# Patient Record
Sex: Female | Born: 1975 | Race: White | Hispanic: No | Marital: Single | Smoking: Never smoker
Health system: Southern US, Community
[De-identification: ages and names within clinical notes are randomized; demographics above are authoritative.]

## PROBLEM LIST (undated history)

## (undated) DIAGNOSIS — E039 Hypothyroidism, unspecified: Secondary | ICD-10-CM

## (undated) DIAGNOSIS — E079 Disorder of thyroid, unspecified: Secondary | ICD-10-CM

## (undated) DIAGNOSIS — G4730 Sleep apnea, unspecified: Secondary | ICD-10-CM

## (undated) DIAGNOSIS — R569 Unspecified convulsions: Secondary | ICD-10-CM

## (undated) DIAGNOSIS — I10 Essential (primary) hypertension: Secondary | ICD-10-CM

## (undated) DIAGNOSIS — Z9989 Dependence on other enabling machines and devices: Secondary | ICD-10-CM

## (undated) DIAGNOSIS — Q059 Spina bifida, unspecified: Secondary | ICD-10-CM

## (undated) HISTORY — DX: Hereditary hemochromatosis: E83.110

## (undated) HISTORY — DX: Essential (primary) hypertension: I10

## (undated) HISTORY — DX: Spina bifida, unspecified: Q05.9

## (undated) HISTORY — DX: Disorder of thyroid, unspecified: E07.9

## (undated) HISTORY — DX: Unspecified convulsions: R56.9

## (undated) HISTORY — DX: Sleep apnea, unspecified: G47.30

## (undated) HISTORY — DX: Dependence on other enabling machines and devices: Z99.89

---
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
## 1998-05-09 HISTORY — PX: OTHER SURGICAL HISTORY: SHX169

## 2009-11-06 ENCOUNTER — Ambulatory Visit: Payer: Self-pay | Admitting: Internal Medicine

## 2009-11-13 ENCOUNTER — Ambulatory Visit: Payer: Self-pay | Admitting: Internal Medicine

## 2009-12-07 ENCOUNTER — Ambulatory Visit: Payer: Self-pay | Admitting: Internal Medicine

## 2010-01-07 ENCOUNTER — Ambulatory Visit: Payer: Self-pay | Admitting: Internal Medicine

## 2010-02-06 ENCOUNTER — Ambulatory Visit: Payer: Self-pay | Admitting: Internal Medicine

## 2010-03-09 ENCOUNTER — Ambulatory Visit: Payer: Self-pay | Admitting: Internal Medicine

## 2010-04-08 ENCOUNTER — Ambulatory Visit: Payer: Self-pay | Admitting: Internal Medicine

## 2010-05-09 ENCOUNTER — Ambulatory Visit: Payer: Self-pay | Admitting: Internal Medicine

## 2010-06-09 ENCOUNTER — Ambulatory Visit: Payer: Self-pay | Admitting: Internal Medicine

## 2010-07-08 ENCOUNTER — Ambulatory Visit: Payer: Self-pay | Admitting: Internal Medicine

## 2010-07-26 ENCOUNTER — Ambulatory Visit: Payer: Self-pay | Admitting: Cardiothoracic Surgery

## 2010-07-28 ENCOUNTER — Ambulatory Visit: Payer: Self-pay | Admitting: Cardiothoracic Surgery

## 2010-08-08 ENCOUNTER — Ambulatory Visit: Payer: Self-pay | Admitting: Internal Medicine

## 2010-09-07 ENCOUNTER — Ambulatory Visit: Payer: Self-pay | Admitting: Internal Medicine

## 2010-10-08 ENCOUNTER — Ambulatory Visit: Payer: Self-pay | Admitting: Internal Medicine

## 2010-11-07 ENCOUNTER — Ambulatory Visit: Payer: Self-pay | Admitting: Internal Medicine

## 2010-11-20 LAB — AFP TUMOR MARKER: AFP-Tumor Marker: 2.9 ng/mL (ref 0.0–8.3)

## 2010-12-08 ENCOUNTER — Ambulatory Visit: Payer: Self-pay | Admitting: Internal Medicine

## 2011-01-08 ENCOUNTER — Ambulatory Visit: Payer: Self-pay | Admitting: Internal Medicine

## 2011-02-07 ENCOUNTER — Ambulatory Visit: Payer: Self-pay | Admitting: Internal Medicine

## 2011-03-10 ENCOUNTER — Ambulatory Visit: Payer: Self-pay | Admitting: Internal Medicine

## 2011-04-09 ENCOUNTER — Ambulatory Visit: Payer: Self-pay | Admitting: Internal Medicine

## 2011-05-10 ENCOUNTER — Ambulatory Visit: Payer: Self-pay | Admitting: Internal Medicine

## 2011-06-03 LAB — CANCER CENTER HEMOGLOBIN: HGB: 11.4 g/dL — ABNORMAL LOW (ref 12.0–16.0)

## 2011-06-10 ENCOUNTER — Ambulatory Visit: Payer: Self-pay | Admitting: Internal Medicine

## 2011-06-17 LAB — HEPATIC FUNCTION PANEL A (ARMC)
Alkaline Phosphatase: 85 U/L (ref 50–136)
Bilirubin,Total: 0.2 mg/dL (ref 0.2–1.0)
SGPT (ALT): 38 U/L

## 2011-06-17 LAB — FERRITIN: Ferritin (ARMC): 1057 ng/mL — ABNORMAL HIGH (ref 8–388)

## 2011-06-17 LAB — CREATININE, SERUM
Creatinine: 0.61 mg/dL (ref 0.60–1.30)
EGFR (African American): >60
EGFR (Non-African Amer.): >60

## 2011-06-17 LAB — IRON AND TIBC
Iron Bind.Cap.(Total): 297 ug/dL (ref 250–450)
Iron Saturation: 71 %
Iron: 212 ug/dL — ABNORMAL HIGH (ref 50–170)
Unbound Iron-Bind.Cap.: 85 ug/dL

## 2011-06-18 LAB — AFP TUMOR MARKER: AFP-Tumor Marker: 2.5 ng/mL (ref 0.0–8.3)

## 2011-07-01 LAB — CANCER CENTER HEMOGLOBIN: HGB: 11.2 g/dL — ABNORMAL LOW (ref 12.0–16.0)

## 2011-07-08 ENCOUNTER — Ambulatory Visit: Payer: Self-pay | Admitting: Internal Medicine

## 2011-07-29 LAB — CBC CANCER CENTER
Basophil #: 0 x10 3/mm (ref 0.0–0.1)
Eosinophil #: 0.1 x10 3/mm (ref 0.0–0.7)
Eosinophil %: 3.3 %
HCT: 34.5 % — ABNORMAL LOW (ref 35.0–47.0)
Lymphocyte #: 1.7 x10 3/mm (ref 1.0–3.6)
MCH: 35.1 pg — ABNORMAL HIGH (ref 26.0–34.0)
MCHC: 34.9 g/dL (ref 32.0–36.0)
MCV: 101 fL — ABNORMAL HIGH (ref 80–100)
Monocyte %: 9.9 %
Neutrophil #: 1.8 x10 3/mm (ref 1.4–6.5)
RDW: 12.9 % (ref 11.5–14.5)
WBC: 4.2 x10 3/mm (ref 3.6–11.0)

## 2011-08-08 ENCOUNTER — Ambulatory Visit: Payer: Self-pay | Admitting: Internal Medicine

## 2011-08-12 LAB — CANCER CENTER HEMOGLOBIN: HGB: 11.9 g/dL — ABNORMAL LOW (ref 12.0–16.0)

## 2011-09-07 ENCOUNTER — Ambulatory Visit: Payer: Self-pay | Admitting: Internal Medicine

## 2011-09-09 LAB — CANCER CENTER HEMOGLOBIN: HGB: 11.6 g/dL — ABNORMAL LOW (ref 12.0–16.0)

## 2011-09-13 ENCOUNTER — Emergency Department: Payer: Self-pay | Admitting: Emergency Medicine

## 2011-09-29 DIAGNOSIS — S93402A Sprain of unspecified ligament of left ankle, initial encounter: Secondary | ICD-10-CM | POA: Insufficient documentation

## 2011-09-29 DIAGNOSIS — M79605 Pain in left leg: Secondary | ICD-10-CM | POA: Insufficient documentation

## 2011-10-07 LAB — CANCER CENTER HEMOGLOBIN: HGB: 7.7 g/dL — ABNORMAL LOW (ref 12.0–16.0)

## 2011-10-08 ENCOUNTER — Ambulatory Visit: Payer: Self-pay | Admitting: Internal Medicine

## 2011-10-21 LAB — CANCER CENTER HEMOGLOBIN: HGB: 11.7 g/dL — ABNORMAL LOW (ref 12.0–16.0)

## 2011-11-07 ENCOUNTER — Ambulatory Visit: Payer: Self-pay | Admitting: Internal Medicine

## 2011-11-18 LAB — HEPATIC FUNCTION PANEL A (ARMC)
Albumin: 4.1 g/dL (ref 3.4–5.0)
Alkaline Phosphatase: 91 U/L (ref 50–136)
Bilirubin, Direct: 0 mg/dL (ref 0.00–0.20)
Bilirubin,Total: 0.2 mg/dL (ref 0.2–1.0)
SGPT (ALT): 28 U/L
Total Protein: 7.4 g/dL (ref 6.4–8.2)

## 2011-11-18 LAB — FERRITIN: Ferritin (ARMC): 481 ng/mL — ABNORMAL HIGH (ref 8–388)

## 2011-11-18 LAB — CREATININE, SERUM
Creatinine: 0.62 mg/dL (ref 0.60–1.30)
EGFR (African American): >60

## 2011-11-18 LAB — IRON AND TIBC: Iron: 216 ug/dL — ABNORMAL HIGH (ref 50–170)

## 2011-12-02 LAB — CBC CANCER CENTER
Basophil #: 0 x10 3/mm (ref 0.0–0.1)
Eosinophil #: 0.1 x10 3/mm (ref 0.0–0.7)
Lymphocyte #: 1.7 x10 3/mm (ref 1.0–3.6)
Lymphocyte %: 37.4 %
MCHC: 35.1 g/dL (ref 32.0–36.0)
MCV: 102 fL — ABNORMAL HIGH (ref 80–100)
Monocyte %: 10.4 %
Neutrophil #: 2.3 x10 3/mm (ref 1.4–6.5)
Platelet: 222 x10 3/mm (ref 150–440)
RBC: 3.3 10*6/uL — ABNORMAL LOW (ref 3.80–5.20)
RDW: 12.7 % (ref 11.5–14.5)
WBC: 4.6 x10 3/mm (ref 3.6–11.0)

## 2011-12-08 ENCOUNTER — Ambulatory Visit: Payer: Self-pay | Admitting: Internal Medicine

## 2011-12-23 LAB — CANCER CENTER HEMOGLOBIN: HGB: 12 g/dL (ref 12.0–16.0)

## 2012-01-08 ENCOUNTER — Ambulatory Visit: Payer: Self-pay | Admitting: Internal Medicine

## 2012-02-03 LAB — CANCER CENTER HEMOGLOBIN: HGB: 11.4 g/dL — ABNORMAL LOW (ref 12.0–16.0)

## 2012-02-07 ENCOUNTER — Ambulatory Visit: Payer: Self-pay | Admitting: Internal Medicine

## 2012-02-24 LAB — CANCER CENTER HEMOGLOBIN: HGB: 12.2 g/dL

## 2012-03-09 ENCOUNTER — Ambulatory Visit: Payer: Self-pay | Admitting: Internal Medicine

## 2012-03-16 LAB — HEPATIC FUNCTION PANEL A (ARMC)
Albumin: 4.1 g/dL (ref 3.4–5.0)
Alkaline Phosphatase: 87 U/L (ref 50–136)
SGOT(AST): 18 U/L (ref 15–37)
SGPT (ALT): 23 U/L (ref 12–78)
Total Protein: 7.9 g/dL (ref 6.4–8.2)

## 2012-03-16 LAB — CANCER CENTER HEMOGLOBIN: HGB: 11.8 g/dL — ABNORMAL LOW (ref 12.0–16.0)

## 2012-03-16 LAB — CREATININE, SERUM: EGFR (Non-African Amer.): >60

## 2012-03-16 LAB — IRON AND TIBC
Iron Saturation: 73 %
Iron: 222 ug/dL — ABNORMAL HIGH (ref 50–170)

## 2012-03-19 LAB — AFP TUMOR MARKER: AFP-Tumor Marker: 2.8 ng/mL (ref 0.0–8.3)

## 2012-04-08 ENCOUNTER — Ambulatory Visit: Payer: Self-pay | Admitting: Internal Medicine

## 2012-04-13 LAB — CBC CANCER CENTER
Basophil %: 0.5 %
Eosinophil %: 2.2 %
HCT: 36.2 % (ref 35.0–47.0)
HGB: 12.6 g/dL (ref 12.0–16.0)
Lymphocyte #: 1.9 x10 3/mm (ref 1.0–3.6)
Lymphocyte %: 30.5 %
Monocyte #: 0.6 x10 3/mm (ref 0.2–0.9)
Monocyte %: 9.9 %
Neutrophil %: 56.9 %
RBC: 3.58 10*6/uL — ABNORMAL LOW (ref 3.80–5.20)
RDW: 12.8 % (ref 11.5–14.5)
WBC: 6.1 x10 3/mm (ref 3.6–11.0)

## 2012-05-09 ENCOUNTER — Ambulatory Visit: Payer: Self-pay | Admitting: Internal Medicine

## 2012-06-09 ENCOUNTER — Ambulatory Visit: Payer: Self-pay | Admitting: Internal Medicine

## 2012-07-06 ENCOUNTER — Emergency Department: Payer: Self-pay | Admitting: Emergency Medicine

## 2012-07-07 ENCOUNTER — Ambulatory Visit: Payer: Self-pay | Admitting: Internal Medicine

## 2012-08-03 ENCOUNTER — Other Ambulatory Visit: Payer: Self-pay | Admitting: Physician Assistant

## 2012-08-03 LAB — BASIC METABOLIC PANEL
BUN: 11 mg/dL (ref 7–18)
Calcium, Total: 8.8 mg/dL (ref 8.5–10.1)
Chloride: 102 mmol/L (ref 98–107)
Creatinine: 0.67 mg/dL (ref 0.60–1.30)
EGFR (African American): >60
Glucose: 86 mg/dL (ref 65–99)
Potassium: 4 mmol/L (ref 3.5–5.1)

## 2012-08-03 LAB — HEPATIC FUNCTION PANEL A (ARMC)
Albumin: 4.4 g/dL (ref 3.4–5.0)
Alkaline Phosphatase: 74 U/L (ref 50–136)
Bilirubin,Total: 0.3 mg/dL (ref 0.2–1.0)
SGOT(AST): 17 U/L (ref 15–37)
SGPT (ALT): 28 U/L (ref 12–78)
Total Protein: 8.5 g/dL — ABNORMAL HIGH (ref 6.4–8.2)

## 2012-08-03 LAB — IRON AND TIBC
Iron Bind.Cap.(Total): 454 ug/dL — ABNORMAL HIGH (ref 250–450)
Iron Saturation: 20 %
Iron: 89 ug/dL (ref 50–170)

## 2012-08-03 LAB — CREATININE, SERUM: EGFR (African American): >60

## 2012-08-03 LAB — CANCER CENTER HEMOGLOBIN: HGB: 11.6 g/dL — ABNORMAL LOW (ref 12.0–16.0)

## 2012-08-07 ENCOUNTER — Ambulatory Visit: Payer: Self-pay | Admitting: Internal Medicine

## 2012-08-31 LAB — CBC CANCER CENTER
Basophil %: 0.6 %
Eosinophil #: 0.1 x10 3/mm (ref 0.0–0.7)
Eosinophil %: 2 %
HCT: 33.2 % — ABNORMAL LOW (ref 35.0–47.0)
HGB: 11 g/dL — ABNORMAL LOW (ref 12.0–16.0)
Lymphocyte #: 2.4 x10 3/mm (ref 1.0–3.6)
Lymphocyte %: 35.3 %
MCH: 30.2 pg (ref 26.0–34.0)
MCHC: 33.2 g/dL (ref 32.0–36.0)
MCV: 91 fL (ref 80–100)
Monocyte %: 12.5 %
Neutrophil #: 3.4 x10 3/mm (ref 1.4–6.5)
Neutrophil %: 49.6 %
Platelet: 315 x10 3/mm (ref 150–440)
RDW: 12.3 % (ref 11.5–14.5)
WBC: 6.9 x10 3/mm (ref 3.6–11.0)

## 2012-09-06 ENCOUNTER — Ambulatory Visit: Payer: Self-pay | Admitting: Internal Medicine

## 2012-10-24 ENCOUNTER — Ambulatory Visit: Payer: Self-pay | Admitting: Internal Medicine

## 2012-11-06 ENCOUNTER — Ambulatory Visit: Payer: Self-pay | Admitting: Internal Medicine

## 2012-12-07 ENCOUNTER — Ambulatory Visit: Payer: Self-pay | Admitting: Internal Medicine

## 2012-12-21 LAB — FERRITIN: Ferritin (ARMC): 8 ng/mL (ref 8–388)

## 2012-12-21 LAB — HEPATIC FUNCTION PANEL A (ARMC)
Alkaline Phosphatase: 66 U/L (ref 50–136)
Bilirubin, Direct: <0.1 mg/dL (ref 0.00–0.20)
SGOT(AST): 23 U/L (ref 15–37)
SGPT (ALT): 34 U/L (ref 12–78)
Total Protein: 7.6 g/dL (ref 6.4–8.2)

## 2012-12-21 LAB — CREATININE, SERUM
Creatinine: 0.61 mg/dL (ref 0.60–1.30)
EGFR (African American): >60
EGFR (Non-African Amer.): >60

## 2012-12-21 LAB — CANCER CENTER HEMOGLOBIN: HGB: 10.5 g/dL — ABNORMAL LOW (ref 12.0–16.0)

## 2012-12-21 LAB — IRON AND TIBC: Iron Saturation: 13 %

## 2013-01-07 ENCOUNTER — Ambulatory Visit: Payer: Self-pay | Admitting: Internal Medicine

## 2013-02-04 DIAGNOSIS — N319 Neuromuscular dysfunction of bladder, unspecified: Secondary | ICD-10-CM | POA: Insufficient documentation

## 2013-02-04 DIAGNOSIS — G40909 Epilepsy, unspecified, not intractable, without status epilepticus: Secondary | ICD-10-CM | POA: Insufficient documentation

## 2013-02-15 ENCOUNTER — Ambulatory Visit: Payer: Self-pay | Admitting: Internal Medicine

## 2013-02-15 LAB — CANCER CENTER HEMOGLOBIN: HGB: 11.9 g/dL — ABNORMAL LOW (ref 12.0–16.0)

## 2013-02-28 DIAGNOSIS — R4789 Other speech disturbances: Secondary | ICD-10-CM | POA: Insufficient documentation

## 2013-03-09 ENCOUNTER — Ambulatory Visit: Payer: Self-pay | Admitting: Internal Medicine

## 2013-04-12 ENCOUNTER — Ambulatory Visit: Payer: Self-pay | Admitting: Internal Medicine

## 2013-04-12 LAB — CBC CANCER CENTER
Basophil #: 0 x10 3/mm (ref 0.0–0.1)
Eosinophil #: 0.2 x10 3/mm (ref 0.0–0.7)
HCT: 34.7 % — ABNORMAL LOW (ref 35.0–47.0)
Lymphocyte #: 1.6 x10 3/mm (ref 1.0–3.6)
Lymphocyte %: 24.3 %
MCH: 31.8 pg (ref 26.0–34.0)
Monocyte #: 0.6 x10 3/mm (ref 0.2–0.9)
Monocyte %: 9.6 %
Neutrophil #: 4.3 x10 3/mm (ref 1.4–6.5)
Neutrophil %: 63.3 %
Platelet: 269 x10 3/mm (ref 150–440)
RBC: 3.71 10*6/uL — ABNORMAL LOW (ref 3.80–5.20)
RDW: 13.9 % (ref 11.5–14.5)
WBC: 6.7 x10 3/mm (ref 3.6–11.0)

## 2013-04-12 LAB — IRON AND TIBC
Iron Bind.Cap.(Total): 246 ug/dL — ABNORMAL LOW (ref 250–450)
Iron Saturation: 41 %
Unbound Iron-Bind.Cap.: 145 ug/dL

## 2013-04-12 LAB — FERRITIN: Ferritin (ARMC): 15 ng/mL (ref 8–388)

## 2013-04-12 LAB — HEPATIC FUNCTION PANEL A (ARMC)
SGOT(AST): 17 U/L (ref 15–37)
SGPT (ALT): 26 U/L (ref 12–78)
Total Protein: 7.7 g/dL (ref 6.4–8.2)

## 2013-05-09 ENCOUNTER — Ambulatory Visit: Payer: Self-pay | Admitting: Internal Medicine

## 2013-06-09 ENCOUNTER — Ambulatory Visit: Payer: Self-pay | Admitting: Internal Medicine

## 2013-06-21 LAB — HEPATIC FUNCTION PANEL A (ARMC)
ALK PHOS: 88 U/L
Albumin: 3.5 g/dL (ref 3.4–5.0)
BILIRUBIN DIRECT: 0.1 mg/dL (ref 0.00–0.20)
Bilirubin,Total: 0.2 mg/dL (ref 0.2–1.0)
SGOT(AST): 16 U/L (ref 15–37)
SGPT (ALT): 18 U/L (ref 12–78)
TOTAL PROTEIN: 7.7 g/dL (ref 6.4–8.2)

## 2013-06-21 LAB — CBC CANCER CENTER
BASOS ABS: 0 x10 3/mm (ref 0.0–0.1)
Basophil %: 0.5 %
Eosinophil #: 0.1 x10 3/mm (ref 0.0–0.7)
Eosinophil %: 1.6 %
HCT: 35.8 % (ref 35.0–47.0)
HGB: 12 g/dL (ref 12.0–16.0)
LYMPHS PCT: 24.1 %
Lymphocyte #: 1.9 x10 3/mm (ref 1.0–3.6)
MCH: 32.2 pg (ref 26.0–34.0)
MCHC: 33.6 g/dL (ref 32.0–36.0)
MCV: 96 fL (ref 80–100)
MONO ABS: 0.7 x10 3/mm (ref 0.2–0.9)
MONOS PCT: 8.9 %
NEUTROS PCT: 64.9 %
Neutrophil #: 5.1 x10 3/mm (ref 1.4–6.5)
PLATELETS: 341 x10 3/mm (ref 150–440)
RBC: 3.74 10*6/uL — ABNORMAL LOW (ref 3.80–5.20)
RDW: 13.4 % (ref 11.5–14.5)
WBC: 7.8 x10 3/mm (ref 3.6–11.0)

## 2013-06-21 LAB — IRON AND TIBC
Iron Bind.Cap.(Total): 265 ug/dL (ref 250–450)
Iron Saturation: 32 %
Iron: 84 ug/dL (ref 50–170)
Unbound Iron-Bind.Cap.: 181 ug/dL

## 2013-06-21 LAB — CREATININE, SERUM
CREATININE: 0.6 mg/dL (ref 0.60–1.30)
EGFR (Non-African Amer.): >60

## 2013-06-21 LAB — FERRITIN: FERRITIN (ARMC): 20 ng/mL (ref 8–388)

## 2013-07-07 ENCOUNTER — Ambulatory Visit: Payer: Self-pay | Admitting: Internal Medicine

## 2013-07-19 ENCOUNTER — Ambulatory Visit: Payer: Self-pay

## 2013-07-19 LAB — COMPREHENSIVE METABOLIC PANEL
AST: 27 U/L (ref 15–37)
Albumin: 3.8 g/dL (ref 3.4–5.0)
Alkaline Phosphatase: 80 U/L
Anion Gap: 4 — ABNORMAL LOW (ref 7–16)
BUN: 10 mg/dL (ref 7–18)
Bilirubin,Total: 0.3 mg/dL (ref 0.2–1.0)
CALCIUM: 8.6 mg/dL (ref 8.5–10.1)
Chloride: 99 mmol/L (ref 98–107)
Co2: 30 mmol/L (ref 21–32)
Creatinine: 0.49 mg/dL — ABNORMAL LOW (ref 0.60–1.30)
EGFR (Non-African Amer.): >60
Glucose: 77 mg/dL (ref 65–99)
Osmolality: 264 (ref 275–301)
Potassium: 3.9 mmol/L (ref 3.5–5.1)
SGPT (ALT): 21 U/L (ref 12–78)
Sodium: 133 mmol/L — ABNORMAL LOW (ref 136–145)
Total Protein: 7.6 g/dL (ref 6.4–8.2)

## 2013-07-19 LAB — CBC WITH DIFFERENTIAL/PLATELET
BASOS PCT: 1 %
Basophil #: 0.1 10*3/uL (ref 0.0–0.1)
EOS PCT: 2.4 %
Eosinophil #: 0.1 10*3/uL (ref 0.0–0.7)
HCT: 34.5 % — ABNORMAL LOW (ref 35.0–47.0)
HGB: 11.7 g/dL — ABNORMAL LOW (ref 12.0–16.0)
LYMPHS ABS: 1.8 10*3/uL (ref 1.0–3.6)
Lymphocyte %: 32.9 %
MCH: 33 pg (ref 26.0–34.0)
MCHC: 34 g/dL (ref 32.0–36.0)
MCV: 97 fL (ref 80–100)
MONOS PCT: 10.1 %
Monocyte #: 0.5 x10 3/mm (ref 0.2–0.9)
Neutrophil #: 2.9 10*3/uL (ref 1.4–6.5)
Neutrophil %: 53.6 %
Platelet: 254 10*3/uL (ref 150–440)
RBC: 3.55 10*6/uL — ABNORMAL LOW (ref 3.80–5.20)
RDW: 13.1 % (ref 11.5–14.5)
WBC: 5.4 10*3/uL (ref 3.6–11.0)

## 2013-07-19 LAB — LIPID PANEL
CHOLESTEROL: 155 mg/dL (ref 0–200)
HDL: 71 mg/dL — AB (ref 40–60)
LDL CHOLESTEROL, CALC: 74 mg/dL (ref 0–100)
TRIGLYCERIDES: 48 mg/dL (ref 0–200)
VLDL Cholesterol, Calc: 10 mg/dL (ref 5–40)

## 2013-07-19 LAB — TSH: THYROID STIMULATING HORM: 0.613 u[IU]/mL

## 2013-08-07 ENCOUNTER — Ambulatory Visit: Payer: Self-pay | Admitting: Internal Medicine

## 2013-09-06 ENCOUNTER — Ambulatory Visit: Payer: Self-pay | Admitting: Internal Medicine

## 2013-09-27 LAB — CBC CANCER CENTER
Basophil #: 0 x10 3/mm (ref 0.0–0.1)
Basophil %: 0.9 %
EOS PCT: 2.7 %
Eosinophil #: 0.1 x10 3/mm (ref 0.0–0.7)
HCT: 35.5 % (ref 35.0–47.0)
HGB: 12.3 g/dL (ref 12.0–16.0)
Lymphocyte #: 1.9 x10 3/mm (ref 1.0–3.6)
Lymphocyte %: 36.1 %
MCH: 33.1 pg (ref 26.0–34.0)
MCHC: 34.7 g/dL (ref 32.0–36.0)
MCV: 96 fL (ref 80–100)
MONO ABS: 0.5 x10 3/mm (ref 0.2–0.9)
Monocyte %: 10.3 %
Neutrophil #: 2.6 x10 3/mm (ref 1.4–6.5)
Neutrophil %: 50 %
PLATELETS: 266 x10 3/mm (ref 150–440)
RBC: 3.71 10*6/uL — ABNORMAL LOW (ref 3.80–5.20)
RDW: 12.4 % (ref 11.5–14.5)
WBC: 5.1 x10 3/mm (ref 3.6–11.0)

## 2013-09-27 LAB — HEPATIC FUNCTION PANEL A (ARMC)
ALT: 22 U/L (ref 12–78)
AST: 16 U/L (ref 15–37)
Albumin: 4 g/dL (ref 3.4–5.0)
Alkaline Phosphatase: 79 U/L
Bilirubin, Direct: 0.1 mg/dL (ref 0.00–0.20)
Bilirubin,Total: 0.2 mg/dL (ref 0.2–1.0)
Total Protein: 7.9 g/dL (ref 6.4–8.2)

## 2013-09-27 LAB — IRON AND TIBC
IRON: 142 ug/dL (ref 50–170)
Iron Bind.Cap.(Total): 236 ug/dL — ABNORMAL LOW (ref 250–450)
Iron Saturation: 60 %
Unbound Iron-Bind.Cap.: 94 ug/dL

## 2013-09-27 LAB — CREATININE, SERUM
Creatinine: 0.63 mg/dL (ref 0.60–1.30)
EGFR (African American): >60

## 2013-09-27 LAB — FERRITIN: FERRITIN (ARMC): 18 ng/mL (ref 8–388)

## 2013-10-07 ENCOUNTER — Ambulatory Visit: Payer: Self-pay | Admitting: Internal Medicine

## 2013-11-07 ENCOUNTER — Ambulatory Visit: Payer: Self-pay | Admitting: Internal Medicine

## 2013-12-07 ENCOUNTER — Ambulatory Visit: Payer: Self-pay | Admitting: Internal Medicine

## 2013-12-20 LAB — CBC CANCER CENTER
Basophil #: 0 x10 3/mm (ref 0.0–0.1)
Basophil %: 0.7 %
EOS ABS: 0.2 x10 3/mm (ref 0.0–0.7)
Eosinophil %: 2.4 %
HCT: 37.3 % (ref 35.0–47.0)
HGB: 12.6 g/dL (ref 12.0–16.0)
LYMPHS PCT: 33.3 %
Lymphocyte #: 2.1 x10 3/mm (ref 1.0–3.6)
MCH: 33.4 pg (ref 26.0–34.0)
MCHC: 33.7 g/dL (ref 32.0–36.0)
MCV: 99 fL (ref 80–100)
MONO ABS: 0.7 x10 3/mm (ref 0.2–0.9)
MONOS PCT: 10.3 %
NEUTROS PCT: 53.3 %
Neutrophil #: 3.4 x10 3/mm (ref 1.4–6.5)
Platelet: 279 x10 3/mm (ref 150–440)
RBC: 3.76 10*6/uL — ABNORMAL LOW (ref 3.80–5.20)
RDW: 12.7 % (ref 11.5–14.5)
WBC: 6.4 x10 3/mm (ref 3.6–11.0)

## 2013-12-20 LAB — HEPATIC FUNCTION PANEL A (ARMC)
ALBUMIN: 3.9 g/dL (ref 3.4–5.0)
ALK PHOS: 78 U/L
Bilirubin,Total: 0.2 mg/dL (ref 0.2–1.0)
SGOT(AST): 17 U/L (ref 15–37)
SGPT (ALT): 25 U/L
Total Protein: 7.9 g/dL (ref 6.4–8.2)

## 2013-12-20 LAB — IRON AND TIBC
IRON SATURATION: 89 %
Iron Bind.Cap.(Total): 211 ug/dL — ABNORMAL LOW (ref 250–450)
Iron: 188 ug/dL — ABNORMAL HIGH (ref 50–170)
UNBOUND IRON-BIND. CAP.: 23 ug/dL

## 2013-12-20 LAB — FERRITIN: FERRITIN (ARMC): 26 ng/mL (ref 8–388)

## 2013-12-20 LAB — CREATININE, SERUM: Creatinine: 0.72 mg/dL (ref 0.60–1.30)

## 2013-12-24 LAB — AFP TUMOR MARKER: AFP-Tumor Marker: 3.2 ng/mL (ref 0.0–8.3)

## 2014-01-07 ENCOUNTER — Ambulatory Visit: Payer: Self-pay | Admitting: Internal Medicine

## 2014-01-16 ENCOUNTER — Ambulatory Visit: Payer: Self-pay | Admitting: Physician Assistant

## 2014-01-31 ENCOUNTER — Ambulatory Visit: Payer: Self-pay | Admitting: Physician Assistant

## 2014-01-31 LAB — T4, FREE: FREE THYROXINE: 1.07 ng/dL (ref 0.76–1.46)

## 2014-01-31 LAB — CBC WITH DIFFERENTIAL/PLATELET
BASOS PCT: 0.5 %
Basophil #: 0 10*3/uL (ref 0.0–0.1)
EOS ABS: 0.1 10*3/uL (ref 0.0–0.7)
EOS PCT: 2.1 %
HCT: 36.2 % (ref 35.0–47.0)
HGB: 11.9 g/dL — ABNORMAL LOW (ref 12.0–16.0)
LYMPHS ABS: 1.8 10*3/uL (ref 1.0–3.6)
Lymphocyte %: 29.2 %
MCH: 33.1 pg (ref 26.0–34.0)
MCHC: 32.9 g/dL (ref 32.0–36.0)
MCV: 101 fL — ABNORMAL HIGH (ref 80–100)
Monocyte #: 0.5 x10 3/mm (ref 0.2–0.9)
Monocyte %: 9 %
Neutrophil #: 3.6 10*3/uL (ref 1.4–6.5)
Neutrophil %: 59.2 %
Platelet: 277 10*3/uL (ref 150–440)
RBC: 3.6 10*6/uL — ABNORMAL LOW (ref 3.80–5.20)
RDW: 12.6 % (ref 11.5–14.5)
WBC: 6.1 10*3/uL (ref 3.6–11.0)

## 2014-01-31 LAB — COMPREHENSIVE METABOLIC PANEL
ALT: 26 U/L
ANION GAP: 6 — AB (ref 7–16)
Albumin: 3.9 g/dL (ref 3.4–5.0)
Alkaline Phosphatase: 67 U/L
BUN: 9 mg/dL (ref 7–18)
Bilirubin,Total: 0.3 mg/dL (ref 0.2–1.0)
CHLORIDE: 102 mmol/L (ref 98–107)
CREATININE: 0.54 mg/dL — AB (ref 0.60–1.30)
Calcium, Total: 8.3 mg/dL — ABNORMAL LOW (ref 8.5–10.1)
Co2: 28 mmol/L (ref 21–32)
EGFR (Non-African Amer.): >60
Glucose: 95 mg/dL (ref 65–99)
OSMOLALITY: 270 (ref 275–301)
POTASSIUM: 3.9 mmol/L (ref 3.5–5.1)
SGOT(AST): 23 U/L (ref 15–37)
Sodium: 136 mmol/L (ref 136–145)
TOTAL PROTEIN: 7.6 g/dL (ref 6.4–8.2)

## 2014-01-31 LAB — CANCER CENTER HEMOGLOBIN: HGB: 12.3 g/dL (ref 12.0–16.0)

## 2014-01-31 LAB — TSH: Thyroid Stimulating Horm: 0.44 u[IU]/mL — ABNORMAL LOW

## 2014-01-31 LAB — PHENYTOIN LEVEL, TOTAL: Dilantin: 36.7 ug/mL (ref 10.0–20.0)

## 2014-02-03 ENCOUNTER — Ambulatory Visit: Payer: Self-pay | Admitting: Physician Assistant

## 2014-02-03 LAB — PHENYTOIN LEVEL, TOTAL: DILANTIN: 23.5 ug/mL — AB (ref 10.0–20.0)

## 2014-02-06 ENCOUNTER — Ambulatory Visit: Payer: Self-pay | Admitting: Internal Medicine

## 2014-02-19 ENCOUNTER — Ambulatory Visit: Payer: Self-pay | Admitting: Physician Assistant

## 2014-02-19 LAB — CBC CANCER CENTER
Basophil #: 0 x10 3/mm (ref 0.0–0.1)
Basophil %: 0.6 %
Eosinophil #: 0.1 x10 3/mm (ref 0.0–0.7)
Eosinophil %: 2 %
HCT: 36.8 % (ref 35.0–47.0)
HGB: 12.4 g/dL (ref 12.0–16.0)
LYMPHS PCT: 34.3 %
Lymphocyte #: 1.8 x10 3/mm (ref 1.0–3.6)
MCH: 33.9 pg (ref 26.0–34.0)
MCHC: 33.6 g/dL (ref 32.0–36.0)
MCV: 101 fL — AB (ref 80–100)
MONOS PCT: 8.1 %
Monocyte #: 0.4 x10 3/mm (ref 0.2–0.9)
Neutrophil #: 2.8 x10 3/mm (ref 1.4–6.5)
Neutrophil %: 55 %
PLATELETS: 280 x10 3/mm (ref 150–440)
RBC: 3.65 10*6/uL — AB (ref 3.80–5.20)
RDW: 12.8 % (ref 11.5–14.5)
WBC: 5.2 x10 3/mm (ref 3.6–11.0)

## 2014-02-19 LAB — IRON AND TIBC
IRON BIND. CAP.(TOTAL): 307 ug/dL (ref 250–450)
IRON SATURATION: 34 %
Iron: 103 ug/dL (ref 50–170)
UNBOUND IRON-BIND. CAP.: 204 ug/dL

## 2014-02-19 LAB — HEPATIC FUNCTION PANEL A (ARMC)
ALK PHOS: 71 U/L
Albumin: 4.1 g/dL (ref 3.4–5.0)
BILIRUBIN TOTAL: 0.3 mg/dL (ref 0.2–1.0)
Bilirubin, Direct: 0.1 mg/dL (ref 0.00–0.20)
SGOT(AST): 17 U/L (ref 15–37)
SGPT (ALT): 28 U/L
TOTAL PROTEIN: 7.9 g/dL (ref 6.4–8.2)

## 2014-02-19 LAB — CREATININE, SERUM
Creatinine: 0.64 mg/dL (ref 0.60–1.30)
EGFR (African American): >60

## 2014-02-19 LAB — FERRITIN: Ferritin (ARMC): 14 ng/mL (ref 8–388)

## 2014-02-19 LAB — PHENYTOIN LEVEL, TOTAL: Dilantin: 5.6 ug/mL — ABNORMAL LOW (ref 10.0–20.0)

## 2014-03-09 ENCOUNTER — Ambulatory Visit: Payer: Self-pay | Admitting: Internal Medicine

## 2014-03-14 LAB — CBC CANCER CENTER
BASOS ABS: 0.1 x10 3/mm (ref 0.0–0.1)
Basophil %: 0.9 %
Eosinophil #: 0.1 x10 3/mm (ref 0.0–0.7)
Eosinophil %: 2.1 %
HCT: 36 % (ref 35.0–47.0)
HGB: 12.2 g/dL (ref 12.0–16.0)
LYMPHS ABS: 1.9 x10 3/mm (ref 1.0–3.6)
LYMPHS PCT: 33.4 %
MCH: 33.7 pg (ref 26.0–34.0)
MCHC: 34 g/dL (ref 32.0–36.0)
MCV: 99 fL (ref 80–100)
Monocyte #: 0.6 x10 3/mm (ref 0.2–0.9)
Monocyte %: 11.4 %
NEUTROS PCT: 52.2 %
Neutrophil #: 3 x10 3/mm (ref 1.4–6.5)
Platelet: 266 x10 3/mm (ref 150–440)
RBC: 3.62 10*6/uL — ABNORMAL LOW (ref 3.80–5.20)
RDW: 11.9 % (ref 11.5–14.5)
WBC: 5.7 x10 3/mm (ref 3.6–11.0)

## 2014-03-24 ENCOUNTER — Ambulatory Visit: Payer: Self-pay | Admitting: Physician Assistant

## 2014-03-24 LAB — CBC CANCER CENTER
Basophil #: 0 x10 3/mm (ref 0.0–0.1)
Basophil %: 0.8 %
EOS ABS: 0.1 x10 3/mm (ref 0.0–0.7)
Eosinophil %: 1.6 %
HCT: 37.2 % (ref 35.0–47.0)
HGB: 12.6 g/dL (ref 12.0–16.0)
LYMPHS ABS: 2.1 x10 3/mm (ref 1.0–3.6)
Lymphocyte %: 32.9 %
MCH: 33.8 pg (ref 26.0–34.0)
MCHC: 33.8 g/dL (ref 32.0–36.0)
MCV: 100 fL (ref 80–100)
MONOS PCT: 9.9 %
Monocyte #: 0.6 x10 3/mm (ref 0.2–0.9)
Neutrophil #: 3.4 x10 3/mm (ref 1.4–6.5)
Neutrophil %: 54.8 %
Platelet: 299 x10 3/mm (ref 150–440)
RBC: 3.72 10*6/uL — ABNORMAL LOW (ref 3.80–5.20)
RDW: 12.1 % (ref 11.5–14.5)
WBC: 6.2 x10 3/mm (ref 3.6–11.0)

## 2014-03-24 LAB — IRON AND TIBC
IRON SATURATION: 29 %
Iron Bind.Cap.(Total): 239 ug/dL — ABNORMAL LOW (ref 250–450)
Iron: 69 ug/dL (ref 50–170)
Unbound Iron-Bind.Cap.: 170 ug/dL

## 2014-03-24 LAB — HEPATIC FUNCTION PANEL A (ARMC)
ALK PHOS: 77 U/L
ALT: 24 U/L
Albumin: 3.8 g/dL (ref 3.4–5.0)
Bilirubin, Direct: 0.1 mg/dL (ref 0.0–0.2)
Bilirubin,Total: 0.3 mg/dL (ref 0.2–1.0)
SGOT(AST): 15 U/L (ref 15–37)
Total Protein: 7.4 g/dL (ref 6.4–8.2)

## 2014-04-08 ENCOUNTER — Ambulatory Visit: Payer: Self-pay | Admitting: Internal Medicine

## 2014-04-25 ENCOUNTER — Ambulatory Visit: Payer: Self-pay | Admitting: Physician Assistant

## 2014-04-25 LAB — CBC CANCER CENTER
Basophil #: 0 x10 3/mm (ref 0.0–0.1)
Basophil #: 0 x10 3/mm (ref 0.0–0.1)
Basophil %: 0.5 %
Basophil %: 0.4 %
EOS ABS: 0.1 x10 3/mm (ref 0.0–0.7)
Eosinophil #: 0.1 x10 3/mm (ref 0.0–0.7)
Eosinophil %: 1.5 %
Eosinophil %: 1.5 %
HCT: 38.3 % (ref 35.0–47.0)
HCT: 37.3 % (ref 35.0–47.0)
HGB: 13.1 g/dL (ref 12.0–16.0)
HGB: 12.7 g/dL (ref 12.0–16.0)
Lymphocyte #: 1.8 x10 3/mm (ref 1.0–3.6)
Lymphocyte %: 27.2 %
Lymphocyte %: 28.3 %
Lymphs Abs: 2.1 x10 3/mm (ref 1.0–3.6)
MCH: 33 pg (ref 26.0–34.0)
MCH: 33 pg (ref 26.0–34.0)
MCHC: 34.2 g/dL (ref 32.0–36.0)
MCHC: 34 g/dL (ref 32.0–36.0)
MCV: 97 fL (ref 80–100)
MCV: 97 fL (ref 80–100)
MONOS PCT: 9.9 %
Monocyte #: 0.6 x10 3/mm (ref 0.2–0.9)
Monocyte #: 0.8 x10 3/mm (ref 0.2–0.9)
Monocyte %: 10.4 %
Neutrophil #: 4 x10 3/mm (ref 1.4–6.5)
Neutrophil #: 4.3 x10 3/mm (ref 1.4–6.5)
Neutrophil %: 60.9 %
Neutrophil %: 59.4 %
Platelet: 286 x10 3/mm (ref 150–440)
Platelet: 292 x10 3/mm (ref 150–440)
RBC: 3.97 10*6/uL (ref 3.80–5.20)
RBC: 3.84 x10 6/mm (ref 3.80–5.20)
RDW: 11.9 % (ref 11.5–14.5)
RDW: 12.2 % (ref 11.5–14.5)
WBC: 6.5 x10 3/mm (ref 3.6–11.0)
WBC: 7.3 x10 3/mm (ref 3.6–11.0)

## 2014-04-25 LAB — IRON: Iron: 113 ug/dL (ref 50–170)

## 2014-04-25 LAB — IRON AND TIBC
Iron Bind.Cap.(Total): 240 ug/dL — ABNORMAL LOW (ref 250–450)
Iron Saturation: 48 %
Iron: 115 ug/dL (ref 50–170)
Unbound Iron-Bind.Cap.: 125 ug/dL

## 2014-04-25 LAB — FERRITIN: Ferritin (ARMC): 16 ng/mL (ref 8–388)

## 2014-04-25 LAB — TSH: Thyroid Stimulating Horm: 0.357 u[IU]/mL — ABNORMAL LOW

## 2014-04-25 LAB — PHENYTOIN LEVEL, TOTAL: Dilantin: 39.4 ug/mL (ref 10.0–20.0)

## 2014-05-09 ENCOUNTER — Ambulatory Visit: Payer: Self-pay | Admitting: Internal Medicine

## 2014-05-13 ENCOUNTER — Ambulatory Visit: Payer: Self-pay | Admitting: Physician Assistant

## 2014-05-13 LAB — PHENYTOIN LEVEL, TOTAL: DILANTIN: 5.2 ug/mL — AB (ref 10.0–20.0)

## 2014-06-06 LAB — CBC CANCER CENTER
BASOS PCT: 0.7 %
Basophil #: 0 x10 3/mm (ref 0.0–0.1)
EOS PCT: 2.2 %
Eosinophil #: 0.1 x10 3/mm (ref 0.0–0.7)
HCT: 37.8 % (ref 35.0–47.0)
HGB: 12.8 g/dL (ref 12.0–16.0)
LYMPHS ABS: 1.7 x10 3/mm (ref 1.0–3.6)
Lymphocyte %: 28.4 %
MCH: 32.6 pg (ref 26.0–34.0)
MCHC: 33.8 g/dL (ref 32.0–36.0)
MCV: 96 fL (ref 80–100)
MONO ABS: 0.5 x10 3/mm (ref 0.2–0.9)
Monocyte %: 9.3 %
Neutrophil #: 3.5 x10 3/mm (ref 1.4–6.5)
Neutrophil %: 59.4 %
Platelet: 240 x10 3/mm (ref 150–440)
RBC: 3.93 10*6/uL (ref 3.80–5.20)
RDW: 12.9 % (ref 11.5–14.5)
WBC: 5.8 x10 3/mm (ref 3.6–11.0)

## 2014-06-06 LAB — IRON AND TIBC
IRON BIND. CAP.(TOTAL): 265 ug/dL (ref 250–450)
Iron Saturation: 31 %
Iron: 82 ug/dL (ref 50–170)
Unbound Iron-Bind.Cap.: 183 ug/dL

## 2014-06-09 ENCOUNTER — Ambulatory Visit: Payer: Self-pay | Admitting: Internal Medicine

## 2014-07-17 ENCOUNTER — Ambulatory Visit: Payer: Self-pay | Admitting: Physician Assistant

## 2014-07-18 ENCOUNTER — Ambulatory Visit
Admit: 2014-07-18 | Disposition: A | Discharge status: Home / Self Care | Payer: Self-pay | Attending: Internal Medicine | Admitting: Internal Medicine

## 2014-08-05 ENCOUNTER — Ambulatory Visit: Payer: Self-pay | Admitting: Physician Assistant

## 2014-08-05 LAB — PHENYTOIN LEVEL, TOTAL: DILANTIN: 18.6 ug/mL

## 2014-08-08 ENCOUNTER — Ambulatory Visit
Admit: 2014-08-08 | Disposition: A | Discharge status: Home / Self Care | Payer: Self-pay | Attending: Internal Medicine | Admitting: Internal Medicine

## 2014-10-09 ENCOUNTER — Other Ambulatory Visit: Payer: Self-pay

## 2014-10-10 ENCOUNTER — Inpatient Hospital Stay: Payer: Non-veteran care

## 2014-10-10 ENCOUNTER — Inpatient Hospital Stay: Payer: Non-veteran care | Attending: Internal Medicine | Admitting: *Deleted

## 2014-10-10 LAB — CBC WITH DIFFERENTIAL/PLATELET
BASOS ABS: 0 10*3/uL (ref 0–0.1)
BASOS PCT: 1 %
Eosinophils Absolute: 0.1 10*3/uL (ref 0–0.7)
Eosinophils Relative: 2 %
HCT: 35.3 % (ref 35.0–47.0)
HEMOGLOBIN: 11.8 g/dL — AB (ref 12.0–16.0)
LYMPHS ABS: 1.8 10*3/uL (ref 1.0–3.6)
LYMPHS PCT: 28 %
MCH: 33.1 pg (ref 26.0–34.0)
MCHC: 33.5 g/dL (ref 32.0–36.0)
MCV: 98.9 fL (ref 80.0–100.0)
MONOS PCT: 8 %
Monocytes Absolute: 0.5 10*3/uL (ref 0.2–0.9)
Neutro Abs: 3.9 10*3/uL (ref 1.4–6.5)
Neutrophils Relative %: 61 %
Platelets: 262 10*3/uL (ref 150–440)
RBC: 3.57 MIL/uL — ABNORMAL LOW (ref 3.80–5.20)
RDW: 12.9 % (ref 11.5–14.5)
WBC: 6.3 10*3/uL (ref 3.6–11.0)

## 2014-10-10 LAB — CREATININE, SERUM
Creatinine, Ser: 0.51 mg/dL (ref 0.44–1.00)
GFR calc Af Amer: >60 mL/min (ref >60)

## 2014-10-10 LAB — IRON AND TIBC
IRON: 96 ug/dL (ref 28–170)
Saturation Ratios: 39 % — ABNORMAL HIGH (ref 10.4–31.8)
TIBC: 244 ug/dL — AB (ref 250–450)
UIBC: 148 ug/dL

## 2014-10-10 LAB — HEPATIC FUNCTION PANEL
ALBUMIN: 4.2 g/dL (ref 3.5–5.0)
ALT: 17 U/L (ref 14–54)
AST: 19 U/L (ref 15–41)
Alkaline Phosphatase: 63 U/L (ref 38–126)
Bilirubin, Direct: 0.1 mg/dL (ref 0.1–0.5)
Indirect Bilirubin: 0.4 mg/dL (ref 0.3–0.9)
TOTAL PROTEIN: 7.5 g/dL (ref 6.5–8.1)
Total Bilirubin: 0.5 mg/dL (ref 0.3–1.2)

## 2014-10-10 LAB — FERRITIN: Ferritin: 13 ng/mL (ref 11–307)

## 2014-10-10 MED ORDER — SODIUM CHLORIDE 0.9 % IJ SOLN
10.0000 mL | Freq: Once | INTRAMUSCULAR | Status: AC
  Administered 2014-10-10: 10 mL via INTRAVENOUS
  Filled 2014-10-10: qty 10

## 2014-10-10 MED ORDER — HEPARIN SOD (PORK) LOCK FLUSH 100 UNIT/ML IV SOLN
INTRAVENOUS | Status: AC
  Filled 2014-10-10: qty 5

## 2014-10-10 MED ORDER — HEPARIN SOD (PORK) LOCK FLUSH 100 UNIT/ML IV SOLN
500.0000 [IU] | Freq: Once | INTRAVENOUS | Status: AC
  Administered 2014-10-10: 500 [IU] via INTRAVENOUS

## 2014-10-10 NOTE — Progress Notes (Signed)
   10/10/14 0925  Clinical Encounter Type  Visited With Patient  Visit Type Initial  Visited with patient in [place] while she was waiting to be called for her treatment.  Patient thanked me for speaking with her and wished me a good day.  [place] [phone]

## 2014-11-21 ENCOUNTER — Inpatient Hospital Stay: Payer: Non-veteran care | Attending: Internal Medicine

## 2014-11-28 ENCOUNTER — Inpatient Hospital Stay: Payer: Non-veteran care | Attending: Internal Medicine

## 2014-11-28 ENCOUNTER — Inpatient Hospital Stay: Payer: Non-veteran care

## 2014-11-28 LAB — HEPATIC FUNCTION PANEL
ALT: 18 U/L (ref 14–54)
AST: 24 U/L (ref 15–41)
Albumin: 4.3 g/dL (ref 3.5–5.0)
Alkaline Phosphatase: 71 U/L (ref 38–126)
Total Bilirubin: 0.5 mg/dL (ref 0.3–1.2)
Total Protein: 7.8 g/dL (ref 6.5–8.1)

## 2014-11-28 LAB — FERRITIN: FERRITIN: 22 ng/mL (ref 11–307)

## 2014-11-28 LAB — CBC
HEMATOCRIT: 36.7 % (ref 35.0–47.0)
HEMOGLOBIN: 12.5 g/dL (ref 12.0–16.0)
MCH: 33.4 pg (ref 26.0–34.0)
MCHC: 33.9 g/dL (ref 32.0–36.0)
MCV: 98.5 fL (ref 80.0–100.0)
Platelets: 267 10*3/uL (ref 150–440)
RBC: 3.73 MIL/uL — ABNORMAL LOW (ref 3.80–5.20)
RDW: 13 % (ref 11.5–14.5)
WBC: 6.3 10*3/uL (ref 3.6–11.0)

## 2014-11-28 LAB — CREATININE, SERUM
Creatinine, Ser: 0.55 mg/dL (ref 0.44–1.00)
GFR calc Af Amer: >60 mL/min (ref >60)
GFR calc non Af Amer: >60 mL/min (ref >60)

## 2014-11-28 LAB — IRON AND TIBC
Iron: 117 ug/dL (ref 28–170)
Saturation Ratios: 58 % — ABNORMAL HIGH (ref 10.4–31.8)
TIBC: 202 ug/dL — AB (ref 250–450)
UIBC: 85 ug/dL

## 2014-11-28 MED ORDER — SODIUM CHLORIDE 0.9 % IJ SOLN
10.0000 mL | Freq: Once | INTRAMUSCULAR | Status: AC
  Administered 2014-11-28: 10 mL via INTRAVENOUS
  Filled 2014-11-28: qty 10

## 2014-11-28 MED ORDER — HEPARIN SOD (PORK) LOCK FLUSH 100 UNIT/ML IV SOLN
500.0000 [IU] | Freq: Once | INTRAVENOUS | Status: AC
  Administered 2014-11-28: 500 [IU] via INTRAVENOUS
  Filled 2014-11-28: qty 5

## 2014-11-28 NOTE — Progress Notes (Signed)
   11/28/14 0935  Clinical Encounter Type  Visited With Patient  Visit Type Follow-up  Visited with patient in cancer center. Provided pastoral presence and support.  Arrie Senate Gertrude Tarbet-pager [phone]

## 2015-01-02 ENCOUNTER — Inpatient Hospital Stay: Payer: Non-veteran care

## 2015-01-02 ENCOUNTER — Inpatient Hospital Stay (HOSPITAL_BASED_OUTPATIENT_CLINIC_OR_DEPARTMENT_OTHER): Payer: Non-veteran care | Admitting: Internal Medicine

## 2015-01-02 ENCOUNTER — Other Ambulatory Visit: Payer: Self-pay | Admitting: *Deleted

## 2015-01-02 ENCOUNTER — Encounter (INDEPENDENT_AMBULATORY_CARE_PROVIDER_SITE_OTHER): Payer: Self-pay

## 2015-01-02 ENCOUNTER — Inpatient Hospital Stay: Payer: Non-veteran care | Attending: Internal Medicine

## 2015-01-02 DIAGNOSIS — M129 Arthropathy, unspecified: Secondary | ICD-10-CM | POA: Insufficient documentation

## 2015-01-02 DIAGNOSIS — Z79899 Other long term (current) drug therapy: Secondary | ICD-10-CM | POA: Insufficient documentation

## 2015-01-02 LAB — CBC WITH DIFFERENTIAL/PLATELET
BASOS ABS: 0 10*3/uL (ref 0–0.1)
BASOS PCT: 1 %
EOS ABS: 0.2 10*3/uL (ref 0–0.7)
Eosinophils Relative: 4 %
HCT: 35.1 % (ref 35.0–47.0)
Hemoglobin: 11.7 g/dL — ABNORMAL LOW (ref 12.0–16.0)
Lymphocytes Relative: 35 %
Lymphs Abs: 1.9 10*3/uL (ref 1.0–3.6)
MCH: 33.1 pg (ref 26.0–34.0)
MCHC: 33.4 g/dL (ref 32.0–36.0)
MCV: 99.1 fL (ref 80.0–100.0)
MONO ABS: 0.6 10*3/uL (ref 0.2–0.9)
MONOS PCT: 11 %
NEUTROS ABS: 2.6 10*3/uL (ref 1.4–6.5)
Neutrophils Relative %: 49 %
PLATELETS: 246 10*3/uL (ref 150–440)
RBC: 3.54 MIL/uL — ABNORMAL LOW (ref 3.80–5.20)
RDW: 12.7 % (ref 11.5–14.5)
WBC: 5.3 10*3/uL (ref 3.6–11.0)

## 2015-01-02 LAB — HEPATIC FUNCTION PANEL
ALK PHOS: 53 U/L (ref 38–126)
ALT: 19 U/L (ref 14–54)
AST: 24 U/L (ref 15–41)
Albumin: 4 g/dL (ref 3.5–5.0)
BILIRUBIN TOTAL: 0.4 mg/dL (ref 0.3–1.2)
Total Protein: 7.1 g/dL (ref 6.5–8.1)

## 2015-01-02 LAB — FERRITIN: Ferritin: 16 ng/mL (ref 11–307)

## 2015-01-02 LAB — IRON AND TIBC
IRON: 118 ug/dL (ref 28–170)
Saturation Ratios: 55 % — ABNORMAL HIGH (ref 10.4–31.8)
TIBC: 213 ug/dL — AB (ref 250–450)
UIBC: 95 ug/dL

## 2015-01-02 LAB — CREATININE, SERUM: CREATININE: 0.48 mg/dL (ref 0.44–1.00)

## 2015-01-02 MED ORDER — SODIUM CHLORIDE 0.9 % IJ SOLN
10.0000 mL | Freq: Once | INTRAMUSCULAR | Status: AC
  Administered 2015-01-02: 10 mL via INTRAVENOUS
  Filled 2015-01-02: qty 10

## 2015-01-02 MED ORDER — HEPARIN SOD (PORK) LOCK FLUSH 100 UNIT/ML IV SOLN
500.0000 [IU] | Freq: Once | INTRAVENOUS | Status: AC
  Administered 2015-01-02: 500 [IU] via INTRAVENOUS
  Filled 2015-01-02: qty 5

## 2015-01-02 NOTE — Progress Notes (Signed)
Patient is here for follow-up. She states that overall she has been feeling good and has no complaints.

## 2015-01-03 LAB — AFP TUMOR MARKER: AFP TUMOR MARKER: 2.3 ng/mL (ref 0.0–8.3)

## 2015-01-19 NOTE — Progress Notes (Signed)
Christina Stephens  Telephone:(336) [phone] Fax:(336) [phone]     ID: BANI GIANFRANCESCO OB: 11/17/1975  MR#: [id number]  HGD#:[id number]  Patient Care Team: [name], MD as PCP - General (Internal Medicine)  CHIEF COMPLAINT/DIAGNOSIS:  Hereditary Hemochromatosis diagnosed July 2011  - presented with elevated serum Ferritin level of 3157. 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 Started phlebotomy end of July 2011. Also on Exjade (Deferasirox) (inititally on 20 mg/kg/1250 mg once daily, then dose decreased to 750 mg daily, held July 2014 onwards, then resumed low dose 250 mg daily Dec 2014).   HISTORY OF PRESENT ILLNESS:  Patient returns for continued hematology followup, she was seen about 5-6 months ago. She is on Exjade at low dose 250 mg daily. States that she is doing steady, denies any acute sickness or other active complaints. Phlebotomy is on hold given excellent control of ferritin levels.  Appetite is steady. Chronic arthritic pain is unchanged.  No new skin discoloration. No fevers. Denies any liver problems or symptoms of CHF.   REVIEW OF SYSTEMS:   ROS As in HPI above. In addition, no fever, chills or sweats. No new headaches or focal weakness.  No new sore throat, cough, shortness of breath, sputum, hemoptysis or chest pain. No angina or palpitation. No abdominal pain, constipation, diarrhea, dysuria or hematuria. No new skin rash or bleeding symptoms. No new paresthesias in extremities.   No Known Allergies  Current Outpatient Prescriptions  Medication Sig Dispense Refill  . bisoprolol (ZEBETA) 5 MG tablet Take 5 mg by mouth daily.  4  . Calcium Carb-Cholecalciferol (CALCIUM 600 + D PO) Take 1 tablet by mouth daily.    Marland Kitchen deferasirox (EXJADE) 250 MG disintegrating tablet Take 1 mg by mouth daily before breakfast.    . levothyroxine (SYNTHROID, LEVOTHROID) 50 MCG tablet Take 50 mcg by mouth daily.  5  . phenytoin (DILANTIN) 100 MG ER capsule  TAKE 2 CAPSULES (200 MG TOTAL) BY MOUTH 2 (TWO) TIMES DAILY.  11   No current facility-administered medications for this visit.    PHYSICAL EXAM: Filed Vitals:   01/02/15 1004  BP: 103/69  Pulse: 64  Temp: 97.6 F (36.4 C)  Resp: 18     There is no weight on file to calculate BMI.     GENERAL: Patient is alert and oriented and in no acute distress. There is no icterus. HEENT: EOMs intact. No cervical lymphadenopathy. CVS: S1S2, regular LUNGS: Bilaterally clear to auscultation, no rhonchi. ABDOMEN: Soft, nontender. No hepatosplenomegaly clinically.  EXTREMITIES: No pedal edema. SKIN: No obvious discoloration or rash   LAB RESULTS: Serum ferritin 16, TIBC 213, iron saturation 55%, serum iron 118, LFT unremarkable. Serum AFP normal at 2.3.    Component Value Date/Time   NA 136 01/31/2014 0918   K 3.9 01/31/2014 0918   CL 102 01/31/2014 0918   CO2 28 01/31/2014 0918   GLUCOSE 95 01/31/2014 0918   BUN 9 01/31/2014 0918   CREATININE 0.48 01/02/2015 0929   CREATININE 0.64 02/19/2014 0949   CALCIUM 8.3* 01/31/2014 0918   PROT 7.1 01/02/2015 0929   PROT 7.4 03/24/2014 0919   ALBUMIN 4.0 01/02/2015 0929   ALBUMIN 3.8 03/24/2014 0919   AST 24 01/02/2015 0929   AST 15 03/24/2014 0919   ALT 19 01/02/2015 0929   ALT 24 03/24/2014 0919   ALKPHOS 53 01/02/2015 0929   ALKPHOS 77 03/24/2014 0919   BILITOT 0.4 01/02/2015 0929   BILITOT 0.3 03/24/2014 0919  GFRNONAA >60 01/02/2015 0929   GFRNONAA >60 02/19/2014 0949   GFRNONAA >60 12/20/2013 0924   GFRAA >60 01/02/2015 0929   GFRAA >60 02/19/2014 0949   GFRAA >60 12/20/2013 0924    Lab Results  Component Value Date   WBC 5.3 01/02/2015   NEUTROABS 2.6 01/02/2015   HGB 11.7* 01/02/2015   HCT 35.1 01/02/2015   MCV 99.1 01/02/2015   PLT 246 01/02/2015    Lab Results  Component Value Date   IRON 118 01/02/2015     ASSESSMENT / PLAN:   1. Hereditary Hemochromatosis  -  Have reviewed labs and discussed with patient.  Serum ferritin is under good control at 16, serum iron is in the normal range, iron saturation slightly elevated at 55%. Given this, will continue on low dose Exjade (Deferasirox) 250 mg once daily, continue to hold phlebotomy for now. Plan is to continue close monitoring, will get CBC, Cr, LFT, ferritin, iron and TIBC at 12 weeks. Next MD follow-up at 24 weeks with CBC, LFT, Cr, ferritin, iron and TIBC, AFP. Patient was explained that if iron levels continues to rise, will then increase dose of Exjade and also possibly look into resuming phlebotomy.  2. Port-A-Cath - prior U/S negative for any RUE DVT. Continue Port flush q6 weeks 3. Serum alpha-fetoprotein within normal range (done for surveillance) 4. In between visits, the patient has been advised to call in case of any new symptoms or side effects from exjade, and will be evaluated sooner. She is agreeable to this plan.     Leia Alf, MD   01/19/2015 9:42 AM

## 2015-02-13 ENCOUNTER — Inpatient Hospital Stay: Payer: Non-veteran care

## 2015-02-20 ENCOUNTER — Inpatient Hospital Stay: Payer: Non-veteran care | Attending: Family Medicine

## 2015-02-20 DIAGNOSIS — Z452 Encounter for adjustment and management of vascular access device: Secondary | ICD-10-CM | POA: Diagnosis not present

## 2015-02-20 DIAGNOSIS — Z23 Encounter for immunization: Secondary | ICD-10-CM | POA: Diagnosis not present

## 2015-02-20 MED ORDER — HEPARIN SOD (PORK) LOCK FLUSH 100 UNIT/ML IV SOLN
500.0000 [IU] | Freq: Once | INTRAVENOUS | Status: AC
  Administered 2015-02-20: 500 [IU] via INTRAVENOUS
  Filled 2015-02-20: qty 5

## 2015-02-20 MED ORDER — SODIUM CHLORIDE 0.9 % IJ SOLN
10.0000 mL | Freq: Once | INTRAMUSCULAR | Status: AC
  Administered 2015-02-20: 10 mL via INTRAVENOUS
  Filled 2015-02-20: qty 10

## 2015-02-20 MED ORDER — INFLUENZA VAC SPLIT QUAD 0.5 ML IM SUSY
0.5000 mL | PREFILLED_SYRINGE | INTRAMUSCULAR | Status: AC
  Administered 2015-02-20: 0.5 mL via INTRAMUSCULAR

## 2015-03-27 ENCOUNTER — Inpatient Hospital Stay: Payer: Non-veteran care

## 2015-03-27 ENCOUNTER — Inpatient Hospital Stay: Payer: Non-veteran care | Attending: Family Medicine

## 2015-03-27 LAB — CBC WITH DIFFERENTIAL/PLATELET
Basophils Absolute: 0 10*3/uL (ref 0–0.1)
Basophils Relative: 1 %
EOS ABS: 0.1 10*3/uL (ref 0–0.7)
EOS PCT: 2 %
HCT: 35.7 % (ref 35.0–47.0)
Hemoglobin: 12.3 g/dL (ref 12.0–16.0)
LYMPHS PCT: 30 %
Lymphs Abs: 1.7 10*3/uL (ref 1.0–3.6)
MCH: 33.2 pg (ref 26.0–34.0)
MCHC: 34.4 g/dL (ref 32.0–36.0)
MCV: 96.7 fL (ref 80.0–100.0)
MONO ABS: 0.6 10*3/uL (ref 0.2–0.9)
Monocytes Relative: 10 %
Neutro Abs: 3.2 10*3/uL (ref 1.4–6.5)
Neutrophils Relative %: 57 %
PLATELETS: 267 10*3/uL (ref 150–440)
RBC: 3.69 MIL/uL — AB (ref 3.80–5.20)
RDW: 12.5 % (ref 11.5–14.5)
WBC: 5.6 10*3/uL (ref 3.6–11.0)

## 2015-03-27 LAB — HEPATIC FUNCTION PANEL
ALK PHOS: 59 U/L (ref 38–126)
ALT: 20 U/L (ref 14–54)
AST: 20 U/L (ref 15–41)
Albumin: 4.1 g/dL (ref 3.5–5.0)
BILIRUBIN TOTAL: 0.6 mg/dL (ref 0.3–1.2)
Total Protein: 7.6 g/dL (ref 6.5–8.1)

## 2015-03-27 LAB — IRON AND TIBC
Iron: 168 ug/dL (ref 28–170)
SATURATION RATIOS: 77 % — AB (ref 10.4–31.8)
TIBC: 219 ug/dL — ABNORMAL LOW (ref 250–450)
UIBC: 51 ug/dL

## 2015-03-27 LAB — CREATININE, SERUM
CREATININE: 0.53 mg/dL (ref 0.44–1.00)
GFR calc Af Amer: >60 mL/min (ref >60)
GFR calc non Af Amer: >60 mL/min (ref >60)

## 2015-03-27 LAB — FERRITIN: FERRITIN: 23 ng/mL (ref 11–307)

## 2015-03-27 MED ORDER — HEPARIN SOD (PORK) LOCK FLUSH 100 UNIT/ML IV SOLN
INTRAVENOUS | Status: AC
  Filled 2015-03-27: qty 5

## 2015-03-27 MED ORDER — SODIUM CHLORIDE 0.9 % IJ SOLN
10.0000 mL | Freq: Once | INTRAMUSCULAR | Status: AC
  Administered 2015-03-27: 10 mL via INTRAVENOUS
  Filled 2015-03-27: qty 10

## 2015-03-27 MED ORDER — HEPARIN SOD (PORK) LOCK FLUSH 100 UNIT/ML IV SOLN
500.0000 [IU] | Freq: Once | INTRAVENOUS | Status: AC
  Administered 2015-03-27: 500 [IU] via INTRAVENOUS

## 2015-05-08 ENCOUNTER — Inpatient Hospital Stay: Payer: Non-veteran care | Attending: Family Medicine

## 2015-05-08 DIAGNOSIS — Z452 Encounter for adjustment and management of vascular access device: Secondary | ICD-10-CM | POA: Insufficient documentation

## 2015-05-08 MED ORDER — HEPARIN SOD (PORK) LOCK FLUSH 100 UNIT/ML IV SOLN
500.0000 [IU] | Freq: Once | INTRAVENOUS | Status: AC
  Administered 2015-05-08: 500 [IU] via INTRAVENOUS

## 2015-05-08 MED ORDER — HEPARIN SOD (PORK) LOCK FLUSH 100 UNIT/ML IV SOLN
INTRAVENOUS | Status: AC
  Filled 2015-05-08: qty 5

## 2015-05-08 MED ORDER — SODIUM CHLORIDE 0.9 % IJ SOLN
10.0000 mL | Freq: Once | INTRAMUSCULAR | Status: AC
Start: 2015-05-08 — End: 2015-05-08
  Administered 2015-05-08: 10 mL via INTRAVENOUS
  Filled 2015-05-08: qty 10

## 2015-06-04 ENCOUNTER — Telehealth: Payer: Self-pay | Admitting: *Deleted

## 2015-06-04 NOTE — Telephone Encounter (Signed)
Bree wanted confirmation of when the md signed the rx that was sent over to refill the exjade.  The date was 06/02/15. They will now process the rx

## 2015-06-13 ENCOUNTER — Emergency Department
Admission: EM | Admit: 2015-06-13 | Discharge: 2015-06-13 | Disposition: A | Discharge status: Home / Self Care | Payer: Non-veteran care | Attending: Emergency Medicine | Admitting: Emergency Medicine

## 2015-06-13 ENCOUNTER — Encounter: Payer: Self-pay | Admitting: Emergency Medicine

## 2015-06-13 DIAGNOSIS — Y9389 Activity, other specified: Secondary | ICD-10-CM | POA: Insufficient documentation

## 2015-06-13 DIAGNOSIS — Y92009 Unspecified place in unspecified non-institutional (private) residence as the place of occurrence of the external cause: Secondary | ICD-10-CM | POA: Diagnosis not present

## 2015-06-13 DIAGNOSIS — Z79899 Other long term (current) drug therapy: Secondary | ICD-10-CM | POA: Diagnosis not present

## 2015-06-13 DIAGNOSIS — S01511A Laceration without foreign body of lip, initial encounter: Secondary | ICD-10-CM | POA: Diagnosis not present

## 2015-06-13 DIAGNOSIS — Y998 Other external cause status: Secondary | ICD-10-CM | POA: Diagnosis not present

## 2015-06-13 DIAGNOSIS — W01198A Fall on same level from slipping, tripping and stumbling with subsequent striking against other object, initial encounter: Secondary | ICD-10-CM | POA: Diagnosis not present

## 2015-06-13 DIAGNOSIS — E039 Hypothyroidism, unspecified: Secondary | ICD-10-CM | POA: Insufficient documentation

## 2015-06-13 MED ORDER — TRAMADOL HCL 50 MG PO TABS: 50.0000 mg | ORAL_TABLET | Freq: Four times a day (QID) | ORAL | Status: DC | PRN

## 2015-06-13 MED ORDER — LIDOCAINE HCL (PF) 1 % IJ SOLN
INTRAMUSCULAR | Status: AC
  Filled 2015-06-13: qty 10

## 2015-06-13 NOTE — ED Provider Notes (Signed)
[facility] Emergency Department Provider Note  ____________________________________________  Time seen: Approximately 11:08 AM  I have reviewed the triage vital signs and the nursing notes.   HISTORY  Chief Complaint Facial Laceration    HPI Christina Stephens is a 40 y.o. female patient tripped and fell and hit her lip on the edge of a table at home. Patient sustained a laceration to the lower inner lip. 2 involvement with this complaint. Patient denies any loss of consciousness. Bleeding was controlled with direct pressure. Patient currently rates the pain as a 2/10. No palliative measures prior to arrival. Patient has history hemochromatosis.   No past medical history on file.  Patient Active Problem List   Diagnosis Date Noted  . Hemochromatosis 11/28/2014    No past surgical history on file.  Current Outpatient Rx  Name  Route  Sig  Dispense  Refill  . bisoprolol (ZEBETA) 5 MG tablet   Oral   Take 5 mg by mouth daily.      4   . Calcium Carb-Cholecalciferol (CALCIUM 600 + D PO)   Oral   Take 1 tablet by mouth daily.         Marland Kitchen deferasirox (EXJADE) 250 MG disintegrating tablet   Oral   Take 1 mg by mouth daily before breakfast.         . levothyroxine (SYNTHROID, LEVOTHROID) 50 MCG tablet   Oral   Take 50 mcg by mouth daily.      5   . phenytoin (DILANTIN) 100 MG ER capsule      TAKE 2 CAPSULES (200 MG TOTAL) BY MOUTH 2 (TWO) TIMES DAILY.      11   . traMADol (ULTRAM) 50 MG tablet   Oral   Take 1 tablet (50 mg total) by mouth every 6 (six) hours as needed for moderate pain.   12 tablet   0     Allergies Review of patient's allergies indicates no known allergies.  No family history on file.  Social History Social History  Substance Use Topics  . Smoking status: Not on file  . Smokeless tobacco: Not on file  . Alcohol Use: Not on file    Review of Systems Constitutional: No fever/chills Eyes: No visual  changes. ENT: No sore throat. Cardiovascular: Denies chest pain. Respiratory: Denies shortness of breath. Gastrointestinal: No abdominal pain.  No nausea, no vomiting.  No diarrhea.  No constipation. Genitourinary: Negative for dysuria. Musculoskeletal: Negative for back pain. Skin: Negative for rash. Lower inner lip laceration Neurological: Negative for headaches, focal weakness or numbness. Endocrine:Hypothyroidism  ____________________________________________   PHYSICAL EXAM:  VITAL SIGNS: ED Triage Vitals  Enc Vitals Group     BP 06/13/15 1058 131/82 mmHg     Pulse Rate 06/13/15 1058 69     Resp 06/13/15 1058 20     Temp 06/13/15 1058 97.8 F (36.6 C)     Temp Source 06/13/15 1058 Oral     SpO2 06/13/15 1058 100 %     Weight 06/13/15 1056 121 lb (54.885 kg)     Height 06/13/15 1056 4' 7"  (1.397 m)     Head Cir --      Peak Flow --      Pain Score 06/13/15 1057 2     Pain Loc --      Pain Edu? --      Excl. in [place]? --     Constitutional: Alert and oriented. Well appearing and in no acute  distress. Eyes: Conjunctivae are normal. PERRL. EOMI. Head: Atraumatic. Nose: No congestion/rhinnorhea. Mouth/Throat: Mucous membranes are moist.  Oropharynx non-erythematous. Neck: No stridor.  No cervical spine tenderness to palpation. Hematological/Lymphatic/Immunilogical: No cervical lymphadenopathy. Cardiovascular: Normal rate, regular rhythm. Grossly normal heart sounds.  Good peripheral circulation. Respiratory: Normal respiratory effort.  No retractions. Lungs CTAB. Gastrointestinal: Soft and nontender. No distention. No abdominal bruits. No CVA tenderness. Musculoskeletal: No lower extremity tenderness nor edema.  No joint effusions. Neurologic:  Normal speech and language. No gross focal neurologic deficits are appreciated. No gait instability. Skin:  Skin is warm, dry and intact. No rash noted. 1 cm laceration lower inner lip Psychiatric: Mood and affect are normal.  Speech and behavior are normal.  ____________________________________________   LABS (all labs ordered are listed, but only abnormal results are displayed)  Labs Reviewed - No data to display ____________________________________________  EKG   ____________________________________________  RADIOLOGY   ____________________________________________   PROCEDURES  Procedure(s) performed: See procedure note  Critical Care performed: No  ________LACERATION REPAIR Performed by: Sable Feil Authorized by: Sable Feil Consent: Verbal consent obtained. Risks and benefits: risks, benefits and alternatives were discussed Consent given by: patient Patient identity confirmed: provided demographic data Prepped and Draped in normal sterile fashion Wound explored  Laceration Location: Lower inner lip  Laceration Length: 1 cm No Foreign Bodies seen or palpated  Anesthesia: Topical and local infiltration.  Local anesthetic: lidocaine 1% without epi  Anesthetic total: 7 ML  Irrigation method: syringe Amount of cleaning: standard  Skin closure: 5-0 Vicryl Number of sutures: 5 Technique: Interrupted   Patient tolerance: Patient tolerated the procedure well with no immediate complications. ____________________________________   INITIAL IMPRESSION / ASSESSMENT AND PLAN / ED COURSE  Pertinent labs & imaging results that were available during my care of the patient were reviewed by me and considered in my medical decision making (see chart for details). Or inner lip laceration. Patient given discharge care instructions. Patient advised the sutures are absorable and no need to follow-up as wound reopens. ____________________________________________   FINAL CLINICAL IMPRESSION(S) / ED DIAGNOSES  Final diagnoses:  Lip laceration, initial encounter      Sable Feil, PA-C 06/13/15 1143  Lisa Roca, MD 06/13/15 [phone]

## 2015-06-13 NOTE — ED Notes (Signed)
Patient presents to the ED with laceration to her bottom lip.  Patient states she tripped and fell and hit her lip on an end table in her home.  Patient denies losing consciousness.  Patient is in no obvious distress at this time.  Bleeding to laceration is controlled.  Lip appears swollen.

## 2015-06-13 NOTE — Discharge Instructions (Signed)
Laceration Care, Adult  A laceration is a cut that goes through all layers of the skin. The cut also goes into the tissue that is right under the skin. Some cuts heal on their own. Others need to be closed with stitches (sutures), staples, skin adhesive strips, or wound glue. Taking care of your cut lowers your risk of infection and helps your cut to heal better.  HOW TO TAKE CARE OF YOUR CUT  For stitches or staples:  · Keep the wound clean and dry.  · If you were given a bandage (dressing), you should change it at least one time per day or as told by your doctor. You should also change it if it gets wet or dirty.  · Keep the wound completely dry for the first 24 hours or as told by your doctor. After that time, you may take a shower or a bath. However, make sure that the wound is not soaked in water until after the stitches or staples have been removed.  · Clean the wound one time each day or as told by your doctor:    Wash the wound with soap and water.    Rinse the wound with water until all of the soap comes off.    Pat the wound dry with a clean towel. Do not rub the wound.  · After you clean the wound, put a thin layer of antibiotic ointment on it as told by your doctor. This ointment:    Helps to prevent infection.    Keeps the bandage from sticking to the wound.  · Have your stitches or staples removed as told by your doctor.  If your doctor used skin adhesive strips:   · Keep the wound clean and dry.  · If you were given a bandage, you should change it at least one time per day or as told by your doctor. You should also change it if it gets dirty or wet.  · Do not get the skin adhesive strips wet. You can take a shower or a bath, but be careful to keep the wound dry.  · If the wound gets wet, pat it dry with a clean towel. Do not rub the wound.  · Skin adhesive strips fall off on their own. You can trim the strips as the wound heals. Do not remove any strips that are still stuck to the wound. They will  fall off after a while.  If your doctor used wound glue:  · Try to keep your wound dry, but you may briefly wet it in the shower or bath. Do not soak the wound in water, such as by swimming.  · After you take a shower or a bath, gently pat the wound dry with a clean towel. Do not rub the wound.  · Do not do any activities that will make you really sweaty until the skin glue has fallen off on its own.  · Do not apply liquid, cream, or ointment medicine to your wound while the skin glue is still on.  · If you were given a bandage, you should change it at least one time per day or as told by your doctor. You should also change it if it gets dirty or wet.  · If a bandage is placed over the wound, do not let the tape for the bandage touch the skin glue.  · Do not pick at the glue. The skin glue usually stays on for 5-10 days. Then, it   falls off of the skin.  General Instructions   · To help prevent scarring, make sure to cover your wound with sunscreen whenever you are outside after stitches are removed, after adhesive strips are removed, or when wound glue stays in place and the wound is healed. Make sure to wear a sunscreen of at least 30 SPF.  · Take over-the-counter and prescription medicines only as told by your doctor.  · If you were given antibiotic medicine or ointment, take or apply it as told by your doctor. Do not stop using the antibiotic even if your wound is getting better.  · Do not scratch or pick at the wound.  · Keep all follow-up visits as told by your doctor. This is important.  · Check your wound every day for signs of infection. Watch for:    Redness, swelling, or pain.    Fluid, blood, or pus.  · Raise (elevate) the injured area above the level of your heart while you are sitting or lying down, if possible.  GET HELP IF:  · You got a tetanus shot and you have any of these problems at the injection site:    Swelling.    Very bad pain.    Redness.    Bleeding.  · You have a fever.  · A wound that was  closed breaks open.  · You notice a bad smell coming from your wound or your bandage.  · You notice something coming out of the wound, such as wood or glass.  · Medicine does not help your pain.  · You have more redness, swelling, or pain at the site of your wound.  · You have fluid, blood, or pus coming from your wound.  · You notice a change in the color of your skin near your wound.  · You need to change the bandage often because fluid, blood, or pus is coming from the wound.  · You start to have a new rash.  · You start to have numbness around the wound.  GET HELP RIGHT AWAY IF:  · You have very bad swelling around the wound.  · Your pain suddenly gets worse and is very bad.  · You notice painful lumps near the wound or on skin that is anywhere on your body.  · You have a red streak going away from your wound.  · The wound is on your hand or foot and you cannot move a finger or toe like you usually can.  · The wound is on your hand or foot and you notice that your fingers or toes look pale or bluish.     This information is not intended to replace advice given to you by your health care provider. Make sure you discuss any questions you have with your health care provider.     Document Released: 10/12/2007 Document Revised: 09/09/2014 Document Reviewed: 04/21/2014  Elsevier Interactive Patient Education ©2016 [employer]

## 2015-06-19 ENCOUNTER — Inpatient Hospital Stay: Payer: Non-veteran care | Attending: Internal Medicine

## 2015-06-19 ENCOUNTER — Inpatient Hospital Stay: Payer: Non-veteran care

## 2015-06-19 ENCOUNTER — Inpatient Hospital Stay (HOSPITAL_BASED_OUTPATIENT_CLINIC_OR_DEPARTMENT_OTHER): Payer: Non-veteran care | Admitting: Internal Medicine

## 2015-06-19 ENCOUNTER — Other Ambulatory Visit: Payer: Self-pay | Admitting: *Deleted

## 2015-06-19 ENCOUNTER — Encounter: Payer: Self-pay | Admitting: Internal Medicine

## 2015-06-19 DIAGNOSIS — Z79899 Other long term (current) drug therapy: Secondary | ICD-10-CM

## 2015-06-19 DIAGNOSIS — Z452 Encounter for adjustment and management of vascular access device: Secondary | ICD-10-CM | POA: Diagnosis not present

## 2015-06-19 DIAGNOSIS — Z95828 Presence of other vascular implants and grafts: Secondary | ICD-10-CM

## 2015-06-19 LAB — COMPREHENSIVE METABOLIC PANEL
ALBUMIN: 4.4 g/dL (ref 3.5–5.0)
ALK PHOS: 90 U/L (ref 38–126)
ALT: 23 U/L (ref 14–54)
ANION GAP: 6 (ref 5–15)
AST: 25 U/L (ref 15–41)
BILIRUBIN TOTAL: 0.3 mg/dL (ref 0.3–1.2)
BUN: 12 mg/dL (ref 6–20)
CALCIUM: 9.6 mg/dL (ref 8.9–10.3)
CO2: 31 mmol/L (ref 22–32)
CREATININE: 0.61 mg/dL (ref 0.44–1.00)
Chloride: 96 mmol/L — ABNORMAL LOW (ref 101–111)
GFR calc Af Amer: >60 mL/min (ref >60)
GFR calc non Af Amer: >60 mL/min (ref >60)
GLUCOSE: 93 mg/dL (ref 65–99)
Potassium: 3.6 mmol/L (ref 3.5–5.1)
Sodium: 133 mmol/L — ABNORMAL LOW (ref 135–145)
TOTAL PROTEIN: 8.3 g/dL — AB (ref 6.5–8.1)

## 2015-06-19 LAB — CBC WITH DIFFERENTIAL/PLATELET
BASOS ABS: 0 10*3/uL (ref 0–0.1)
BASOS PCT: 1 %
Eosinophils Absolute: 0.1 10*3/uL (ref 0–0.7)
Eosinophils Relative: 2 %
HEMATOCRIT: 36.2 % (ref 35.0–47.0)
Hemoglobin: 12.7 g/dL (ref 12.0–16.0)
Lymphocytes Relative: 31 %
Lymphs Abs: 2.1 10*3/uL (ref 1.0–3.6)
MCH: 33.8 pg (ref 26.0–34.0)
MCHC: 35.2 g/dL (ref 32.0–36.0)
MCV: 95.9 fL (ref 80.0–100.0)
MONO ABS: 0.6 10*3/uL (ref 0.2–0.9)
Monocytes Relative: 8 %
NEUTROS ABS: 4 10*3/uL (ref 1.4–6.5)
Neutrophils Relative %: 58 %
PLATELETS: 339 10*3/uL (ref 150–440)
RBC: 3.78 MIL/uL — ABNORMAL LOW (ref 3.80–5.20)
RDW: 12.5 % (ref 11.5–14.5)
WBC: 6.9 10*3/uL (ref 3.6–11.0)

## 2015-06-19 LAB — IRON AND TIBC
Iron: 138 ug/dL (ref 28–170)
Saturation Ratios: 60 % — ABNORMAL HIGH (ref 10.4–31.8)
TIBC: 228 ug/dL — AB (ref 250–450)
UIBC: 91 ug/dL

## 2015-06-19 LAB — FERRITIN: Ferritin: 32 ng/mL (ref 11–307)

## 2015-06-19 MED ORDER — SODIUM CHLORIDE 0.9% FLUSH
10.0000 mL | INTRAVENOUS | Status: DC | PRN
  Administered 2015-06-19: 10 mL via INTRAVENOUS
  Filled 2015-06-19: qty 10

## 2015-06-19 MED ORDER — HEPARIN SOD (PORK) LOCK FLUSH 100 UNIT/ML IV SOLN
500.0000 [IU] | Freq: Once | INTRAVENOUS | Status: AC
  Administered 2015-06-19: 500 [IU] via INTRAVENOUS

## 2015-06-19 NOTE — Progress Notes (Signed)
[place]  Telephone:(336) [phone] Fax:(336) [phone]     ID: Christina Stephens OB: 01-30-1976  MR#: [id number]  KVQ#:[id number]  Patient Care Team: [name], MD as PCP - General (Internal Medicine)  CHIEF COMPLAINT/DIAGNOSIS:  Hereditary Hemochromatosis diagnosed July 2011  - presented with elevated serum Ferritin level of 3157. 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 Started phlebotomy end of July 2011. Also on Exjade (Deferasirox) (inititally on 20 mg/kg/1250 mg once daily, then dose decreased to 750 mg daily, held July 2014 onwards, then resumed low dose 250 mg daily Dec 2014).   HISTORY OF PRESENT ILLNESS:  Christina Stephens returns for continued hematology followup. She continues to take Exjade at low dose 250 mg daily, and has been able to tolerate that well without GI symptoms. She specifically denies diarrhea, nausea, vomiting, bloating, bowel incontinence and urinary incontinence. She walks with crutches. She recently fell, and injured a lower lip. REVIEW OF SYSTEMS:   ROS As in HPI above. In addition, no fever, chills or sweats. No new headaches or focal weakness.  No new sore throat, cough, shortness of breath, sputum, hemoptysis or chest pain. No angina or palpitation. No abdominal pain, constipation, diarrhea, dysuria or hematuria. No new skin rash or bleeding symptoms. No new paresthesias in extremities.   No Known Allergies  Current Outpatient Prescriptions  Medication Sig Dispense Refill  . bisoprolol (ZEBETA) 5 MG tablet Take 5 mg by mouth daily.  4  . Calcium Carb-Cholecalciferol (CALCIUM 600 + D PO) Take 1 tablet by mouth daily.    Marland Kitchen deferasirox (EXJADE) 250 MG disintegrating tablet Take 1 mg by mouth daily before breakfast.    . levothyroxine (SYNTHROID, LEVOTHROID) 50 MCG tablet Take 50 mcg by mouth daily.  5  . phenytoin (DILANTIN) 100 MG ER capsule TAKE 2 CAPSULES (200 MG TOTAL) BY MOUTH 2 (TWO) TIMES DAILY.  11  . traMADol  (ULTRAM) 50 MG tablet Take 1 tablet (50 mg total) by mouth every 6 (six) hours as needed for moderate pain. 12 tablet 0   No current facility-administered medications for this visit.    PHYSICAL EXAM: Filed Vitals:   06/19/15 1000  BP: 135/83  Pulse: 79  Temp: 97.2 F (36.2 C)  Resp: 18     Body mass index is 27.96 kg/(m^2).     GENERAL: Patient is alert and oriented and in no acute distress. There is no icterus. HEENT: EOMs intact. No cervical lymphadenopathy. Bilateral VP shunts CVS: S1S2, regular. No murmurs. Port-A-Cath is in place, no erythema. LUNGS: Bilaterally clear to auscultation, no rhonchi. ABDOMEN: Soft, nontender. No hepatosplenomegaly clinically.  EXTREMITIES: No pedal edema. Deformity of lower extremities. SKIN: No obvious discoloration or rash   LAB RESULTS: Results for orders placed or performed in visit on 06/19/15 (from the past 24 hour(s))  CBC with Differential/Platelet     Status: Abnormal   Collection Time: 06/19/15  9:30 AM  Result Value Ref Range   WBC 6.9 3.6 - 11.0 K/uL   RBC 3.78 (L) 3.80 - 5.20 MIL/uL   Hemoglobin 12.7 12.0 - 16.0 g/dL   HCT 36.2 35.0 - 47.0 %   MCV 95.9 80.0 - 100.0 fL   MCH 33.8 26.0 - 34.0 pg   MCHC 35.2 32.0 - 36.0 g/dL   RDW 12.5 11.5 - 14.5 %   Platelets 339 150 - 440 K/uL   Neutrophils Relative % 58 %   Neutro Abs 4.0 1.4 - 6.5 K/uL   Lymphocytes Relative 31 %  Lymphs Abs 2.1 1.0 - 3.6 K/uL   Monocytes Relative 8 %   Monocytes Absolute 0.6 0.2 - 0.9 K/uL   Eosinophils Relative 2 %   Eosinophils Absolute 0.1 0 - 0.7 K/uL   Basophils Relative 1 %   Basophils Absolute 0.0 0 - 0.1 K/uL            ASSESSMENT / PLAN:   1. Hereditary Hemochromatosis (C282Y) -  she has done well on low dose Exjade (Deferasirox) 250 mg once daily; we will review her ferritin levels from today to decide on that dose adjustment, but likely will continue to hold phlebotomy for now. She will return to our clinic in 6 months for M.D.  visit, AFP, CBC, Cr, LFT, ferritin, iron and TIBC at 12 weeks.  2. Port-A-Cath - prior U/S negative for any RUE DVT. Continue Port flush q6 weeks 3. Serum alpha-fetoprotein within normal range 6 months ago (done for surveillance), to be rechecked in 6 months 4. In between visits, the patient has been advised to call in case of any new symptoms or side effects from exjade, and will be evaluated sooner. She is agreeable to this plan.     [name], MD   06/19/2015 9:46 AM

## 2015-06-22 MED ORDER — DEFERASIROX 250 MG PO TBSO: 250.0000 mg | ORAL_TABLET | Freq: Every day | ORAL | Status: DC

## 2015-06-22 NOTE — Addendum Note (Signed)
Addended by: [name] on: 06/22/2015 08:55 AM   Modules accepted: Orders

## 2015-07-31 ENCOUNTER — Inpatient Hospital Stay: Payer: Non-veteran care | Attending: Family Medicine

## 2015-07-31 DIAGNOSIS — Z95828 Presence of other vascular implants and grafts: Secondary | ICD-10-CM

## 2015-07-31 DIAGNOSIS — Z452 Encounter for adjustment and management of vascular access device: Secondary | ICD-10-CM | POA: Insufficient documentation

## 2015-07-31 MED ORDER — SODIUM CHLORIDE 0.9% FLUSH
10.0000 mL | INTRAVENOUS | Status: DC | PRN
  Administered 2015-07-31: 10 mL via INTRAVENOUS
  Filled 2015-07-31: qty 10

## 2015-07-31 MED ORDER — HEPARIN SOD (PORK) LOCK FLUSH 100 UNIT/ML IV SOLN
500.0000 [IU] | Freq: Once | INTRAVENOUS | Status: AC
  Administered 2015-07-31: 500 [IU] via INTRAVENOUS

## 2015-09-11 ENCOUNTER — Inpatient Hospital Stay: Payer: Non-veteran care | Attending: Family Medicine

## 2015-09-11 DIAGNOSIS — Z452 Encounter for adjustment and management of vascular access device: Secondary | ICD-10-CM | POA: Insufficient documentation

## 2015-09-11 DIAGNOSIS — R232 Flushing: Secondary | ICD-10-CM

## 2015-09-11 DIAGNOSIS — D45 Polycythemia vera: Secondary | ICD-10-CM

## 2015-09-11 MED ORDER — HEPARIN SOD (PORK) LOCK FLUSH 100 UNIT/ML IV SOLN
INTRAVENOUS | Status: AC
  Filled 2015-09-11: qty 5

## 2015-09-11 MED ORDER — HEPARIN SOD (PORK) LOCK FLUSH 100 UNIT/ML IV SOLN
500.0000 [IU] | Freq: Once | INTRAVENOUS | Status: AC
  Administered 2015-09-11: 500 [IU] via INTRAVENOUS

## 2015-09-11 MED ORDER — SODIUM CHLORIDE 0.9% FLUSH
10.0000 mL | INTRAVENOUS | Status: DC | PRN
  Administered 2015-09-11: 10 mL via INTRAVENOUS
  Filled 2015-09-11: qty 10

## 2015-10-23 ENCOUNTER — Inpatient Hospital Stay: Payer: Non-veteran care | Attending: Family Medicine

## 2015-10-23 DIAGNOSIS — Z95828 Presence of other vascular implants and grafts: Secondary | ICD-10-CM

## 2015-10-23 DIAGNOSIS — Z452 Encounter for adjustment and management of vascular access device: Secondary | ICD-10-CM | POA: Diagnosis not present

## 2015-10-23 MED ORDER — SODIUM CHLORIDE 0.9% FLUSH
10.0000 mL | INTRAVENOUS | Status: DC | PRN
  Administered 2015-10-23: 10 mL via INTRAVENOUS
  Filled 2015-10-23: qty 10

## 2015-10-23 MED ORDER — HEPARIN SOD (PORK) LOCK FLUSH 100 UNIT/ML IV SOLN
500.0000 [IU] | Freq: Once | INTRAVENOUS | Status: AC
  Administered 2015-10-23: 500 [IU] via INTRAVENOUS

## 2015-12-04 ENCOUNTER — Encounter (INDEPENDENT_AMBULATORY_CARE_PROVIDER_SITE_OTHER): Payer: Self-pay

## 2015-12-04 ENCOUNTER — Inpatient Hospital Stay: Payer: Non-veteran care | Attending: Internal Medicine

## 2015-12-04 DIAGNOSIS — Z452 Encounter for adjustment and management of vascular access device: Secondary | ICD-10-CM | POA: Diagnosis not present

## 2015-12-04 DIAGNOSIS — Z95828 Presence of other vascular implants and grafts: Secondary | ICD-10-CM

## 2015-12-04 MED ORDER — SODIUM CHLORIDE 0.9% FLUSH
10.0000 mL | Freq: Once | INTRAVENOUS | Status: AC
  Administered 2015-12-04: 10 mL via INTRAVENOUS
  Filled 2015-12-04: qty 10

## 2015-12-04 MED ORDER — HEPARIN SOD (PORK) LOCK FLUSH 100 UNIT/ML IV SOLN
500.0000 [IU] | Freq: Once | INTRAVENOUS | Status: AC
Start: 2015-12-04 — End: 2015-12-04
  Administered 2015-12-04: 500 [IU] via INTRAVENOUS
  Filled 2015-12-04: qty 5

## 2015-12-17 ENCOUNTER — Other Ambulatory Visit: Payer: Self-pay

## 2015-12-18 ENCOUNTER — Inpatient Hospital Stay: Payer: Non-veteran care

## 2015-12-18 ENCOUNTER — Other Ambulatory Visit: Payer: Self-pay | Admitting: *Deleted

## 2015-12-18 ENCOUNTER — Encounter: Payer: Self-pay | Admitting: Hematology and Oncology

## 2015-12-18 ENCOUNTER — Telehealth: Payer: Self-pay | Admitting: *Deleted

## 2015-12-18 ENCOUNTER — Inpatient Hospital Stay: Payer: Non-veteran care | Attending: Hematology and Oncology | Admitting: Hematology and Oncology

## 2015-12-18 ENCOUNTER — Ambulatory Visit: Payer: Non-veteran care

## 2015-12-18 ENCOUNTER — Other Ambulatory Visit: Payer: Self-pay

## 2015-12-18 DIAGNOSIS — G40802 Other epilepsy, not intractable, without status epilepticus: Secondary | ICD-10-CM

## 2015-12-18 DIAGNOSIS — E039 Hypothyroidism, unspecified: Secondary | ICD-10-CM | POA: Diagnosis not present

## 2015-12-18 DIAGNOSIS — Z8041 Family history of malignant neoplasm of ovary: Secondary | ICD-10-CM

## 2015-12-18 DIAGNOSIS — Z79899 Other long term (current) drug therapy: Secondary | ICD-10-CM | POA: Insufficient documentation

## 2015-12-18 DIAGNOSIS — G4730 Sleep apnea, unspecified: Secondary | ICD-10-CM | POA: Insufficient documentation

## 2015-12-18 DIAGNOSIS — I10 Essential (primary) hypertension: Secondary | ICD-10-CM | POA: Insufficient documentation

## 2015-12-18 DIAGNOSIS — Q059 Spina bifida, unspecified: Secondary | ICD-10-CM | POA: Diagnosis not present

## 2015-12-18 LAB — HEPATIC FUNCTION PANEL
ALT: 17 U/L (ref 14–54)
AST: 21 U/L (ref 15–41)
Albumin: 4.5 g/dL (ref 3.5–5.0)
Alkaline Phosphatase: 61 U/L (ref 38–126)
Bilirubin, Direct: <0.1 mg/dL — ABNORMAL LOW (ref 0.1–0.5)
Total Bilirubin: 0.5 mg/dL (ref 0.3–1.2)
Total Protein: 7.7 g/dL (ref 6.5–8.1)

## 2015-12-18 LAB — CBC WITH DIFFERENTIAL/PLATELET
Basophils Absolute: 0 10*3/uL (ref 0–0.1)
Basophils Relative: 1 %
Eosinophils Absolute: 0.1 10*3/uL (ref 0–0.7)
Eosinophils Relative: 2 %
HCT: 35.3 % (ref 35.0–47.0)
Hemoglobin: 12.6 g/dL (ref 12.0–16.0)
Lymphocytes Relative: 31 %
Lymphs Abs: 1.7 10*3/uL (ref 1.0–3.6)
MCH: 34.4 pg — ABNORMAL HIGH (ref 26.0–34.0)
MCHC: 35.7 g/dL (ref 32.0–36.0)
MCV: 96.2 fL (ref 80.0–100.0)
Monocytes Absolute: 0.5 10*3/uL (ref 0.2–0.9)
Monocytes Relative: 8 %
Neutro Abs: 3.2 10*3/uL (ref 1.4–6.5)
Neutrophils Relative %: 58 %
Platelets: 268 10*3/uL (ref 150–440)
RBC: 3.67 MIL/uL — ABNORMAL LOW (ref 3.80–5.20)
RDW: 12.5 % (ref 11.5–14.5)
WBC: 5.5 10*3/uL (ref 3.6–11.0)

## 2015-12-18 LAB — FERRITIN: Ferritin: 30 ng/mL (ref 11–307)

## 2015-12-18 NOTE — Progress Notes (Signed)
[facility] day:  12/18/15  Chief Complaint: Christina Stephens is a 40 y.o. female with hemochromatosis who is seen for reassessment.  HPI:  The patient was diagnosed with hereditary hemochromatosis in 11/2009.  She presented with elevated serum ferritin level of 3157.  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  She was started on a phlebotomy at the end of July 2011.  She was also started on Exjade (deferasirox).  She was inititally on 20 mg/kg (1250 mg once daily), then dose decreased to 750 mg daily.  Exjade was held in 11/2012, then resumed at a low dose 250 mg daily in 04/2013.  Ferritin has remained low:  13 on 10/20/2014, 22 on 11/28/2014, 16 on 01/02/2015, 23 on 03/27/2015, and 32 on 06/19/2015.  AFP has remained normal:  1.9 on 05/07/2010, 2.9 on 11/19/2010, 2.5 on 06/17/2011, 2.8 on 03/16/2012, 2.2 on 04/12/2013, 3.2 on 12/20/2013, and 2.3 on 01/02/2015.  She was last seen in the hematology clinic on 06/19/2015 by Dr. [name].  At that time, she was doing well.  CBC revealed a hematocrit of 36.2, hemoglobin 12.7, and MCV 95.9.  Liver function tests were normal.  Ferritin was 32.  Iron studies revealed an iron saturation of 60%, TIBC 228, and iron 138.  Symptomatically, she feels fine.  She states that her diet is pretty good.   Past Medical History:  Diagnosis Date  . CPAP (continuous positive airway pressure) dependence   . Hereditary hemochromatosis   . Hypertension   . Seizures ([place])   . Sleep apnea   . Spina bifida ([place])   . Thyroid disease    hypothyroidism    History reviewed. No pertinent surgical history.  Family History  Problem Relation Age of Onset  . Hypertension Father   . CAD Father   . Arthritis Sister   . Ovarian cancer Maternal Aunt   . CAD Paternal Grandfather     Social History:  reports that she has never smoked. She has never used smokeless tobacco. She reports that she does  not drink alcohol or use drugs.  The patient is accompanied by her mother today.  Allergies:  Allergies  Allergen Reactions  . Latex     Pt has Spina bifada and was told that if she has surgery she should avoid latex for poss. Reaction/infection.    Current Medications: Current Outpatient Prescriptions  Medication Sig Dispense Refill  . bisoprolol (ZEBETA) 5 MG tablet Take 5 mg by mouth daily.  4  . deferasirox (EXJADE) 250 MG disintegrating tablet Take 1 tablet (250 mg total) by mouth daily before breakfast. 30 tablet 11  . levothyroxine (SYNTHROID, LEVOTHROID) 50 MCG tablet Take 50 mcg by mouth daily.  5  . phenytoin (DILANTIN) 100 MG ER capsule TAKE 2 CAPSULES (200 MG TOTAL) BY MOUTH 2 (TWO) TIMES DAILY.  11  . traMADol (ULTRAM) 50 MG tablet Take 1 tablet (50 mg total) by mouth every 6 (six) hours as needed for moderate pain. 12 tablet 0   No current facility-administered medications for this visit.     Review of Systems:  GENERAL:  Feels good.  No fevers, sweats or weight loss. PERFORMANCE STATUS (ECOG):  2 HEENT:  No visual changes, runny nose, sore throat, mouth sores or tenderness. Lungs: No shortness of breath or cough.  No hemoptysis. Cardiac:  No chest pain, palpitations, orthopnea, or PND. GI:  No nausea, vomiting, diarrhea, constipation, melena or hematochezia. GU:  No  urgency, frequency, dysuria, or hematuria. Musculoskeletal:  No back pain.  No joint pain.  No muscle tenderness. Extremities:  No pain or swelling. Skin:  No rashes or skin changes. Neuro:  Spina bifida.  No seizure since 2011.  No headache, focal numbness or weakness. Endocrine:  No diabetes.  Thyroid issues on Synthroid.  No hot flashes or night sweats. Psych:  No mood changes, depression or anxiety. Pain:  No focal pain. Review of systems:  All other systems reviewed and found to be negative.  Physical Exam: Blood pressure 129/72, pulse 60, temperature (!) 96.4 F (35.8 C), temperature source  Tympanic, resp. rate 18, weight 121 lb 4.1 oz (55 kg). GENERAL:  Well developed, well nourished, woman sitting comfortably in the exam room in no acute distress.  Crutches. MENTAL STATUS:  Alert and oriented to person, place and time. HEAD:  Long dark slightly wavy hair.  Normocephalic, atraumatic, face symmetric, no Cushingoid features. EYES:  Brown/hazel eyes.  Pupils equal round and reactive to light and accomodation.  No conjunctivitis or scleral icterus. ENT:  Oropharynx clear without lesion.  Tongue normal. Mucous membranes moist.  RESPIRATORY:  Clear to auscultation without rales, wheezes or rhonchi. CARDIOVASCULAR:  Regular rate and rhythm without murmur, rub or gallop. CHEST:  Right sided port-a-cath.  Left sided shunt. ABDOMEN:  Soft, non-tender, with active bowel sounds, and no hepatosplenomegaly.  No masses. SKIN:  No rashes, ulcers or lesions. EXTREMITIES:  Wears leg braces.  Legs cool.  No edema, no skin discoloration or tenderness.  No palpable cords. LYMPH NODES: No palpable cervical, supraclavicular, axillary or inguinal adenopathy  NEUROLOGICAL: Unremarkable. PSYCH:  Appropriate.   Orders Only on 12/18/2015  Component Date Value Ref Range Status  . WBC 12/18/2015 5.5  3.6 - 11.0 K/uL Final  . RBC 12/18/2015 3.67* 3.80 - 5.20 MIL/uL Final  . Hemoglobin 12/18/2015 12.6  12.0 - 16.0 g/dL Final  . HCT 12/18/2015 35.3  35.0 - 47.0 % Final  . MCV 12/18/2015 96.2  80.0 - 100.0 fL Final  . MCH 12/18/2015 34.4* 26.0 - 34.0 pg Final  . MCHC 12/18/2015 35.7  32.0 - 36.0 g/dL Final  . RDW 12/18/2015 12.5  11.5 - 14.5 % Final  . Platelets 12/18/2015 268  150 - 440 K/uL Final  . Neutrophils Relative % 12/18/2015 58  % Final  . Neutro Abs 12/18/2015 3.2  1.4 - 6.5 K/uL Final  . Lymphocytes Relative 12/18/2015 31  % Final  . Lymphs Abs 12/18/2015 1.7  1.0 - 3.6 K/uL Final  . Monocytes Relative 12/18/2015 8  % Final  . Monocytes Absolute 12/18/2015 0.5  0.2 - 0.9 K/uL Final  .  Eosinophils Relative 12/18/2015 2  % Final  . Eosinophils Absolute 12/18/2015 0.1  0 - 0.7 K/uL Final  . Basophils Relative 12/18/2015 1  % Final  . Basophils Absolute 12/18/2015 0.0  0 - 0.1 K/uL Final  . Total Protein 12/18/2015 7.7  6.5 - 8.1 g/dL Final  . Albumin 12/18/2015 4.5  3.5 - 5.0 g/dL Final  . AST 12/18/2015 21  15 - 41 U/L Final  . ALT 12/18/2015 17  14 - 54 U/L Final  . Alkaline Phosphatase 12/18/2015 61  38 - 126 U/L Final  . Total Bilirubin 12/18/2015 0.5  0.3 - 1.2 mg/dL Final  . Bilirubin, Direct 12/18/2015 <0.1* 0.1 - 0.5 mg/dL Final  . Indirect Bilirubin 12/18/2015 NOT CALCULATED  0.3 - 0.9 mg/dL Final    Assessment:  Salem Senate  is a 40 y.o. female with hereditary hemochromatosis diagnosed in 11/2009.  She presented with elevated serum ferritin level of 3157.  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    She began a phlebotomy program in 11/2009.  She was also started on Exjade (deferasirox).  She was inititally on 20 mg/kg (1250 mg once daily), then dose decreased to 750 mg daily.  Exjade was held in 11/2012, then resumed at a low dose 250 mg daily in 04/2013.  Ferritin has remained low:  13 on 10/20/2014, 22 on 11/28/2014, 16 on 01/02/2015, 23 on 03/27/2015, and 32 on 06/19/2015.  AFP has remained normal:  1.9 on 05/07/2010, 2.9 on 11/19/2010, 2.5 on 06/17/2011, 2.8 on 03/16/2012, 2.2 on 04/12/2013, 3.2 on 12/20/2013, 2.3 on 01/02/2015, and 2.9 on 12/18/2015.  Labs on 06/19/2015 revealed a hematocrit of 36.2, hemoglobin 12.7, and MCV 95.9.  Liver function tests were normal.  Ferritin was 32.  Iron studies revealed an iron saturation of 60%, TIBC 228, and iron 138.  She has spina bifida and is s/p shunt.  She has a port-a-cath.  Symptomatically, she feels fine.  She states that her diet is pretty good.  Plan: 1.  Review entire medical history, diagnosis and management of hemochromatosis.  Discuss plan for continuation of Exjade.  Goal  ferritin < 50.  Discuss AFP check and liver ultrasound every 6 months. 2.  Schedule limited abdominal ultrasound 3.  Continue Exjade. 4.  Port flush every 6-8 weeks. 5.  RTC in 6 months for MD assessment and labs (CBC with diff, LFTs, ferritin, AFP).   Lequita Asal, MD  12/18/2015, 10:40 AM

## 2015-12-18 NOTE — Progress Notes (Signed)
States is feeling well. Offers no complaints.

## 2015-12-18 NOTE — Telephone Encounter (Signed)
Called [facility] and spoke to nurse in regards to TDAP vaccine that pt was inquiring about.  From [facility] point of view and in regards to shot when it is about hemochromatosis dx. She could have one.  Patient states she has never had TDAP in her life. She has spina bifida.  Per [facility] their office would need to check up with spina bifida md to determine if she could have it because it seems odd that she never rcvd one in her life and the only dx she has had for her life is the spina bifida.  Pt will see their office next week. I told pt what I told staff at [employer] [facility] office

## 2015-12-18 NOTE — Progress Notes (Signed)
Per pt she is sue for tetanus and he wanted her to have TDAP and wanted her to check with [facility] and make decision. Mike Gip is ok with giving her injection today and we have it from hospital pharmacy.

## 2015-12-19 LAB — AFP TUMOR MARKER: AFP-Tumor Marker: 2.9 ng/mL (ref 0.0–8.3)

## 2016-01-04 ENCOUNTER — Ambulatory Visit
Admission: RE | Admit: 2016-01-04 | Discharge: 2016-01-04 | Disposition: A | Discharge status: Home / Self Care | Payer: Non-veteran care | Source: Ambulatory Visit | Attending: Hematology and Oncology | Admitting: Hematology and Oncology

## 2016-01-04 DIAGNOSIS — K8020 Calculus of gallbladder without cholecystitis without obstruction: Secondary | ICD-10-CM | POA: Diagnosis not present

## 2016-01-15 ENCOUNTER — Inpatient Hospital Stay: Payer: Non-veteran care | Attending: Hematology and Oncology

## 2016-01-15 DIAGNOSIS — D751 Secondary polycythemia: Secondary | ICD-10-CM

## 2016-01-15 DIAGNOSIS — Z452 Encounter for adjustment and management of vascular access device: Secondary | ICD-10-CM | POA: Diagnosis not present

## 2016-01-15 MED ORDER — HEPARIN SOD (PORK) LOCK FLUSH 100 UNIT/ML IV SOLN
500.0000 [IU] | Freq: Once | INTRAVENOUS | Status: AC
  Administered 2016-01-15: 500 [IU] via INTRAVENOUS

## 2016-01-15 MED ORDER — HEPARIN SOD (PORK) LOCK FLUSH 100 UNIT/ML IV SOLN
INTRAVENOUS | Status: AC
  Filled 2016-01-15: qty 5

## 2016-01-15 MED ORDER — SODIUM CHLORIDE 0.9% FLUSH
10.0000 mL | INTRAVENOUS | Status: DC | PRN
  Administered 2016-01-15: 10 mL via INTRAVENOUS
  Filled 2016-01-15: qty 10

## 2016-02-15 ENCOUNTER — Other Ambulatory Visit: Payer: Self-pay | Admitting: *Deleted

## 2016-02-26 ENCOUNTER — Inpatient Hospital Stay: Payer: Non-veteran care | Attending: Hematology and Oncology

## 2016-02-26 ENCOUNTER — Inpatient Hospital Stay: Payer: Non-veteran care

## 2016-02-26 DIAGNOSIS — Z95828 Presence of other vascular implants and grafts: Secondary | ICD-10-CM

## 2016-02-26 DIAGNOSIS — Z23 Encounter for immunization: Secondary | ICD-10-CM

## 2016-02-26 MED ORDER — INFLUENZA VAC SPLIT QUAD 0.5 ML IM SUSY
0.5000 mL | PREFILLED_SYRINGE | Freq: Once | INTRAMUSCULAR | Status: AC
  Administered 2016-02-26: 0.5 mL via INTRAMUSCULAR
  Filled 2016-02-26: qty 0.5

## 2016-02-26 MED ORDER — SODIUM CHLORIDE 0.9% FLUSH
10.0000 mL | INTRAVENOUS | Status: AC | PRN
  Administered 2016-02-26: 10 mL via INTRAVENOUS
  Filled 2016-02-26: qty 10

## 2016-02-26 MED ORDER — HEPARIN SOD (PORK) LOCK FLUSH 100 UNIT/ML IV SOLN
500.0000 [IU] | Freq: Once | INTRAVENOUS | Status: AC
  Administered 2016-02-26: 500 [IU] via INTRAVENOUS

## 2016-04-08 ENCOUNTER — Inpatient Hospital Stay: Payer: Non-veteran care | Attending: Hematology and Oncology

## 2016-04-08 ENCOUNTER — Inpatient Hospital Stay: Payer: Non-veteran care | Admitting: *Deleted

## 2016-04-08 DIAGNOSIS — Z95828 Presence of other vascular implants and grafts: Secondary | ICD-10-CM

## 2016-04-08 LAB — CBC WITH DIFFERENTIAL/PLATELET
Basophils Absolute: 0 10*3/uL (ref 0–0.1)
Basophils Relative: 1 %
Eosinophils Absolute: 0.1 10*3/uL (ref 0–0.7)
Eosinophils Relative: 2 %
HCT: 35.4 % (ref 35.0–47.0)
Hemoglobin: 12.3 g/dL (ref 12.0–16.0)
Lymphocytes Relative: 30 %
Lymphs Abs: 1.8 10*3/uL (ref 1.0–3.6)
MCH: 33.8 pg (ref 26.0–34.0)
MCHC: 34.8 g/dL (ref 32.0–36.0)
MCV: 97.1 fL (ref 80.0–100.0)
Monocytes Absolute: 0.6 10*3/uL (ref 0.2–0.9)
Monocytes Relative: 10 %
Neutro Abs: 3.4 10*3/uL (ref 1.4–6.5)
Neutrophils Relative %: 57 %
Platelets: 252 10*3/uL (ref 150–440)
RBC: 3.65 MIL/uL — ABNORMAL LOW (ref 3.80–5.20)
RDW: 12.6 % (ref 11.5–14.5)
WBC: 5.9 10*3/uL (ref 3.6–11.0)

## 2016-04-08 LAB — FERRITIN: Ferritin: 34 ng/mL (ref 11–307)

## 2016-04-08 LAB — COMPREHENSIVE METABOLIC PANEL
ALT: 21 U/L (ref 14–54)
AST: 22 U/L (ref 15–41)
Albumin: 4.2 g/dL (ref 3.5–5.0)
Alkaline Phosphatase: 60 U/L (ref 38–126)
Anion gap: 5 (ref 5–15)
BUN: 16 mg/dL (ref 6–20)
CO2: 29 mmol/L (ref 22–32)
Calcium: 9.3 mg/dL (ref 8.9–10.3)
Chloride: 101 mmol/L (ref 101–111)
Creatinine, Ser: 0.49 mg/dL (ref 0.44–1.00)
GFR calc Af Amer: >60 mL/min (ref >60)
GFR calc non Af Amer: >60 mL/min (ref >60)
Glucose, Bld: 111 mg/dL — ABNORMAL HIGH (ref 65–99)
Potassium: 4.1 mmol/L (ref 3.5–5.1)
Sodium: 135 mmol/L (ref 135–145)
Total Bilirubin: 0.4 mg/dL (ref 0.3–1.2)
Total Protein: 7.6 g/dL (ref 6.5–8.1)

## 2016-04-08 MED ORDER — SODIUM CHLORIDE 0.9% FLUSH
10.0000 mL | INTRAVENOUS | Status: DC | PRN
Start: 2016-04-08 — End: 2016-04-08
  Administered 2016-04-08: 10 mL via INTRAVENOUS
  Filled 2016-04-08: qty 10

## 2016-04-08 MED ORDER — HEPARIN SOD (PORK) LOCK FLUSH 100 UNIT/ML IV SOLN
500.0000 [IU] | Freq: Once | INTRAVENOUS | Status: AC
  Administered 2016-04-08: 500 [IU] via INTRAVENOUS

## 2016-05-20 ENCOUNTER — Inpatient Hospital Stay: Payer: Non-veteran care | Attending: Hematology and Oncology

## 2016-05-20 DIAGNOSIS — Z95828 Presence of other vascular implants and grafts: Secondary | ICD-10-CM

## 2016-05-20 DIAGNOSIS — Z452 Encounter for adjustment and management of vascular access device: Secondary | ICD-10-CM | POA: Insufficient documentation

## 2016-05-20 MED ORDER — SODIUM CHLORIDE 0.9% FLUSH
10.0000 mL | INTRAVENOUS | Status: DC | PRN
Start: 2016-05-20 — End: 2016-05-20
  Administered 2016-05-20: 10 mL via INTRAVENOUS
  Filled 2016-05-20: qty 10

## 2016-05-20 MED ORDER — HEPARIN SOD (PORK) LOCK FLUSH 100 UNIT/ML IV SOLN
500.0000 [IU] | Freq: Once | INTRAVENOUS | Status: AC
  Administered 2016-05-20: 500 [IU] via INTRAVENOUS

## 2016-06-20 ENCOUNTER — Inpatient Hospital Stay (HOSPITAL_BASED_OUTPATIENT_CLINIC_OR_DEPARTMENT_OTHER): Admitting: Hematology and Oncology

## 2016-06-20 ENCOUNTER — Inpatient Hospital Stay: Attending: Hematology and Oncology

## 2016-06-20 DIAGNOSIS — K8020 Calculus of gallbladder without cholecystitis without obstruction: Secondary | ICD-10-CM | POA: Diagnosis not present

## 2016-06-20 DIAGNOSIS — M25532 Pain in left wrist: Secondary | ICD-10-CM | POA: Insufficient documentation

## 2016-06-20 DIAGNOSIS — E039 Hypothyroidism, unspecified: Secondary | ICD-10-CM | POA: Diagnosis not present

## 2016-06-20 DIAGNOSIS — Q059 Spina bifida, unspecified: Secondary | ICD-10-CM

## 2016-06-20 DIAGNOSIS — G4730 Sleep apnea, unspecified: Secondary | ICD-10-CM

## 2016-06-20 DIAGNOSIS — R569 Unspecified convulsions: Secondary | ICD-10-CM

## 2016-06-20 DIAGNOSIS — Z8041 Family history of malignant neoplasm of ovary: Secondary | ICD-10-CM | POA: Insufficient documentation

## 2016-06-20 DIAGNOSIS — I10 Essential (primary) hypertension: Secondary | ICD-10-CM

## 2016-06-20 DIAGNOSIS — Z79899 Other long term (current) drug therapy: Secondary | ICD-10-CM | POA: Insufficient documentation

## 2016-06-20 LAB — CBC WITH DIFFERENTIAL/PLATELET
Basophils Absolute: 0 10*3/uL (ref 0–0.1)
Basophils Relative: 1 %
Eosinophils Absolute: 0.1 10*3/uL (ref 0–0.7)
Eosinophils Relative: 2 %
HCT: 33.4 % — ABNORMAL LOW (ref 35.0–47.0)
Hemoglobin: 11.8 g/dL — ABNORMAL LOW (ref 12.0–16.0)
Lymphocytes Relative: 34 %
Lymphs Abs: 1.7 10*3/uL (ref 1.0–3.6)
MCH: 34.1 pg — ABNORMAL HIGH (ref 26.0–34.0)
MCHC: 35.3 g/dL (ref 32.0–36.0)
MCV: 96.5 fL (ref 80.0–100.0)
Monocytes Absolute: 0.5 10*3/uL (ref 0.2–0.9)
Monocytes Relative: 10 %
Neutro Abs: 2.6 10*3/uL (ref 1.4–6.5)
Neutrophils Relative %: 53 %
Platelets: 279 10*3/uL (ref 150–440)
RBC: 3.46 MIL/uL — ABNORMAL LOW (ref 3.80–5.20)
RDW: 12.6 % (ref 11.5–14.5)
WBC: 4.9 10*3/uL (ref 3.6–11.0)

## 2016-06-20 LAB — HEPATIC FUNCTION PANEL
ALT: 19 U/L (ref 14–54)
AST: 22 U/L (ref 15–41)
Albumin: 4.3 g/dL (ref 3.5–5.0)
Alkaline Phosphatase: 59 U/L (ref 38–126)
Bilirubin, Direct: <0.1 mg/dL — ABNORMAL LOW (ref 0.1–0.5)
Total Bilirubin: 0.4 mg/dL (ref 0.3–1.2)
Total Protein: 8 g/dL (ref 6.5–8.1)

## 2016-06-20 LAB — FERRITIN: Ferritin: 34 ng/mL (ref 11–307)

## 2016-06-20 NOTE — Progress Notes (Signed)
[facility] [facility] day:  06/20/16  Chief Complaint: Christina Stephens is a 41 y.o. female with hemochromatosis who is seen for 6 month assessment.  HPI:  The patient was last seen in the medical oncology clinic on 12/18/2015.  At that time, she was seen for initial assessment by me.  She was doing well on Exjade.  Ferritin was 34.  She did not require a phlebotomy.  AFP was normal.  RUQ ultrasound on 01/04/2016 revealed multiple small gallstones.  Symptomatically, she feels fine. She complains of minor joint pain in left wrist.  She is being followed by her PCP.  She wears a brace at night. Her diet is pretty good. She denies any abnormal bleeding.  She just recently had a normal menstrual cycle.   Past Medical History:  Diagnosis Date  . CPAP (continuous positive airway pressure) dependence   . Hereditary hemochromatosis   . Hypertension   . Seizures ([place])   . Sleep apnea   . Spina bifida ([place])   . Thyroid disease    hypothyroidism    No past surgical history on file.  Family History  Problem Relation Age of Onset  . Hypertension Father   . CAD Father   . Arthritis Sister   . Ovarian cancer Maternal Aunt   . CAD Paternal Grandfather     Social History:  reports that she has never smoked. She has never used smokeless tobacco. She reports that she does not drink alcohol or use drugs.  She lives in [place].  The patient is accompanied by her mother today.  Allergies:  Allergies  Allergen Reactions  . Latex     Pt has Spina bifada and was told that if she has surgery she should avoid latex for poss. Reaction/infection.    Current Medications: Current Outpatient Prescriptions  Medication Sig Dispense Refill  . bisoprolol (ZEBETA) 5 MG tablet Take 5 mg by mouth daily.  4  . deferasirox (EXJADE) 250 MG disintegrating tablet Take 1 tablet (250 mg total) by mouth daily before breakfast. 30 tablet 11  . levothyroxine (SYNTHROID, LEVOTHROID) 50  MCG tablet Take 50 mcg by mouth daily.  5  . phenytoin (DILANTIN) 100 MG ER capsule TAKE 2 CAPSULES (200 MG TOTAL) BY MOUTH 2 (TWO) TIMES DAILY.  11  . traMADol (ULTRAM) 50 MG tablet Take 1 tablet (50 mg total) by mouth every 6 (six) hours as needed for moderate pain. (Patient not taking: Reported on 06/20/2016) 12 tablet 0   No current facility-administered medications for this visit.    Facility-Administered Medications Ordered in Other Visits  Medication Dose Route Frequency Provider Last Rate Last Dose  . sodium chloride flush (NS) 0.9 % injection 10 mL  10 mL Intravenous PRN Cammie Sickle, MD   10 mL at 02/26/16 3976    Review of Systems:  GENERAL:  Feels good.  No fevers or sweats.  Weight up 1 pound. PERFORMANCE STATUS (ECOG):  2 HEENT:  No visual changes, runny nose, sore throat, mouth sores or tenderness. Lungs: No shortness of breath or cough.  No hemoptysis. Cardiac:  No chest pain, palpitations, orthopnea, or PND. GI:  No nausea, vomiting, diarrhea, constipation, melena or hematochezia. GU:  No urgency, frequency, dysuria, or hematuria.  Normal menses. Musculoskeletal:  No back pain.  No joint pain. No muscle tenderness. Extremities: Intermittent left wrist pain. No pain or swelling. Skin:  No rashes or skin changes. Neuro:  Spina bifida.  No  seizure since 2011.  No headache, focal numbness or weakness. Endocrine:  No diabetes.  Thyroid issues on Synthroid.  No hot flashes or night sweats. Psych:  No mood changes, depression or anxiety. Pain:  No focal pain. Review of systems:  All other systems reviewed and found to be negative.  Physical Exam: Blood pressure 115/76, pulse 70, temperature 98.1 F (36.7 C), temperature source Oral, weight 122 lb 3.9 oz (55.4 kg). GENERAL:  Well developed, well nourished, woman sitting comfortably in the exam room in no acute distress.  She has crutches at her side. MENTAL STATUS:  Alert and oriented to person, place and time. HEAD:   Long dark slightly wavy hair.  Normocephalic, atraumatic, face symmetric, no Cushingoid features. EYES:  Brown/hazel eyes.  Pupils equal round and reactive to light and accomodation.  No conjunctivitis or scleral icterus. ENT:  Oropharynx clear without lesion.  Tongue normal. Mucous membranes moist.  RESPIRATORY:  Clear to auscultation without rales, wheezes or rhonchi. CARDIOVASCULAR:  Regular rate and rhythm without murmur, rub or gallop. CHEST:  Right sided port-a-cath.  Left sided shunt. ABDOMEN:  Soft, non-tender, with active bowel sounds, and no hepatosplenomegaly.  No masses. SKIN:  No rashes, ulcers or lesions. EXTREMITIES:  Leg braces.  Legs cool.  No edema, no skin discoloration or tenderness.  No palpable cords. LYMPH NODES: No palpable cervical, supraclavicular, axillary or inguinal adenopathy  NEUROLOGICAL: Unremarkable. PSYCH:  Appropriate.   Appointment on 06/20/2016  Component Date Value Ref Range Status  . WBC 06/20/2016 4.9  3.6 - 11.0 K/uL Final  . RBC 06/20/2016 3.46* 3.80 - 5.20 MIL/uL Final  . Hemoglobin 06/20/2016 11.8* 12.0 - 16.0 g/dL Final  . HCT 06/20/2016 33.4* 35.0 - 47.0 % Final  . MCV 06/20/2016 96.5  80.0 - 100.0 fL Final  . MCH 06/20/2016 34.1* 26.0 - 34.0 pg Final  . MCHC 06/20/2016 35.3  32.0 - 36.0 g/dL Final  . RDW 06/20/2016 12.6  11.5 - 14.5 % Final  . Platelets 06/20/2016 279  150 - 440 K/uL Final  . Neutrophils Relative % 06/20/2016 53  % Final  . Neutro Abs 06/20/2016 2.6  1.4 - 6.5 K/uL Final  . Lymphocytes Relative 06/20/2016 34  % Final  . Lymphs Abs 06/20/2016 1.7  1.0 - 3.6 K/uL Final  . Monocytes Relative 06/20/2016 10  % Final  . Monocytes Absolute 06/20/2016 0.5  0.2 - 0.9 K/uL Final  . Eosinophils Relative 06/20/2016 2  % Final  . Eosinophils Absolute 06/20/2016 0.1  0 - 0.7 K/uL Final  . Basophils Relative 06/20/2016 1  % Final  . Basophils Absolute 06/20/2016 0.0  0 - 0.1 K/uL Final  . Total Protein 06/20/2016 8.0  6.5 - 8.1 g/dL  Final  . Albumin 06/20/2016 4.3  3.5 - 5.0 g/dL Final  . AST 06/20/2016 22  15 - 41 U/L Final  . ALT 06/20/2016 19  14 - 54 U/L Final  . Alkaline Phosphatase 06/20/2016 59  38 - 126 U/L Final  . Total Bilirubin 06/20/2016 0.4  0.3 - 1.2 mg/dL Final  . Bilirubin, Direct 06/20/2016 <0.1* 0.1 - 0.5 mg/dL Final  . Indirect Bilirubin 06/20/2016 NOT CALCULATED  0.3 - 0.9 mg/dL Final  . Ferritin 06/20/2016 34  11 - 307 ng/mL Final    Assessment:  SELIN EISLER is a 41 y.o. female with hereditary hemochromatosis diagnosed in 11/2009.  She presented with elevated serum ferritin level of 3157.  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  She began a phlebotomy program in 11/2009.  She was also started on Exjade (deferasirox).  She was inititally on 20 mg/kg (1250 mg once daily), then dose decreased to 750 mg daily.  Exjade was held in 11/2012, then resumed at a low dose 250 mg daily in 04/2013.  Ferritin has remained low:  13 on 10/20/2014, 22 on 11/28/2014, 16 on 01/02/2015, 23 on 03/27/2015, 32 on 06/19/2015, 30 on 12/18/2015, 34 on 04/08/2016, and 34 on 06/20/2016.  AFP has remained normal:  1.9 on 05/07/2010, 2.9 on 11/19/2010, 2.5 on 06/17/2011, 2.8 on 03/16/2012, 2.2 on 04/12/2013, 3.2 on 12/20/2013, 2.3 on 01/02/2015, 2.9 on 12/18/2015, and 3.2 on 06/20/2016.  Labs on 06/19/2015 revealed a hematocrit of 36.2, hemoglobin 12.7, and MCV 95.9.  Liver function tests were normal.  Ferritin was 32.  Iron studies revealed an iron saturation of 60%, TIBC 228, and iron 138.  Labs today revealed hematocrit 33.4, hemoglobin 11.8 and MCV 96.5. Liver function tests are normal. Ferritin is 34. Will re-check CBC at next post flush due to a slight decrease in hematocrit from August 2017. Patient states that she just has her menstrual cycle last week.   RUQ ultrasound on 01/04/2016 revealed multiple small gallstones.   She has spina bifida and is s/p shunt.  She has a  port-a-cath.  Symptomatically, she notes minor left wrist pain. Her diet and appetite are good.   Ferritin is 34 today.  Plan: 1.  Labs today:  CBC with diff, LFTs, ferritin, AFP. 2.  Review interim labs and imaging studies.  She continues to do well.  Discuss continuation of Exjade. No need for phlebotomy.  Goal ferritin < 50.  Continue AFP checks and liver ultrasound every 6 months. 3.  Schedule limited abdominal ultrasound in the next few weeks. 4.  Continue Exjade. 5.  Port flush every 6-8 weeks.  Check CBC at that time. 6.  RTC in 6 months for MD assessment and labs (CBC with diff, LFTs, ferritin, AFP).  The patient was seen and examined.  The assessment and plan was discussed with the patient.  A few questions were asked and answered.    Christina Casa, NP  Christina Asal, MD 06/20/2016, 11:38 AM

## 2016-06-21 LAB — AFP TUMOR MARKER: AFP-Tumor Marker: 3.2 ng/mL (ref 0.0–8.3)

## 2016-07-12 ENCOUNTER — Encounter: Payer: Self-pay | Admitting: Hematology and Oncology

## 2016-08-01 ENCOUNTER — Ambulatory Visit
Admission: RE | Admit: 2016-08-01 | Discharge: 2016-08-01 | Disposition: A | Discharge status: Home / Self Care | Payer: Non-veteran care | Source: Ambulatory Visit | Attending: Hematology and Oncology | Admitting: Hematology and Oncology

## 2016-08-01 ENCOUNTER — Inpatient Hospital Stay: Payer: Non-veteran care | Attending: Hematology and Oncology

## 2016-08-01 DIAGNOSIS — K8020 Calculus of gallbladder without cholecystitis without obstruction: Secondary | ICD-10-CM | POA: Diagnosis not present

## 2016-08-01 DIAGNOSIS — Z95828 Presence of other vascular implants and grafts: Secondary | ICD-10-CM

## 2016-08-01 LAB — CBC WITH DIFFERENTIAL/PLATELET
Basophils Absolute: 0 10*3/uL (ref 0–0.1)
Basophils Relative: 0 %
Eosinophils Absolute: 0.1 10*3/uL (ref 0–0.7)
Eosinophils Relative: 2 %
HCT: 34.7 % — ABNORMAL LOW (ref 35.0–47.0)
Hemoglobin: 12.2 g/dL (ref 12.0–16.0)
Lymphocytes Relative: 26 %
Lymphs Abs: 1.7 10*3/uL (ref 1.0–3.6)
MCH: 34.5 pg — ABNORMAL HIGH (ref 26.0–34.0)
MCHC: 35.2 g/dL (ref 32.0–36.0)
MCV: 98 fL (ref 80.0–100.0)
Monocytes Absolute: 0.6 10*3/uL (ref 0.2–0.9)
Monocytes Relative: 9 %
Neutro Abs: 4.2 10*3/uL (ref 1.4–6.5)
Neutrophils Relative %: 63 %
Platelets: 270 10*3/uL (ref 150–440)
RBC: 3.54 MIL/uL — ABNORMAL LOW (ref 3.80–5.20)
RDW: 12.9 % (ref 11.5–14.5)
WBC: 6.7 10*3/uL (ref 3.6–11.0)

## 2016-08-01 MED ORDER — SODIUM CHLORIDE 0.9% FLUSH
10.0000 mL | INTRAVENOUS | Status: DC | PRN
  Administered 2016-08-01: 10 mL via INTRAVENOUS
  Filled 2016-08-01: qty 10

## 2016-08-01 MED ORDER — HEPARIN SOD (PORK) LOCK FLUSH 100 UNIT/ML IV SOLN
500.0000 [IU] | Freq: Once | INTRAVENOUS | Status: AC
  Administered 2016-08-01: 500 [IU] via INTRAVENOUS

## 2016-09-12 ENCOUNTER — Inpatient Hospital Stay: Payer: Non-veteran care | Attending: Hematology and Oncology

## 2016-09-12 DIAGNOSIS — Z452 Encounter for adjustment and management of vascular access device: Secondary | ICD-10-CM | POA: Diagnosis not present

## 2016-09-12 DIAGNOSIS — Z95828 Presence of other vascular implants and grafts: Secondary | ICD-10-CM

## 2016-09-12 MED ORDER — HEPARIN SOD (PORK) LOCK FLUSH 100 UNIT/ML IV SOLN
500.0000 [IU] | Freq: Once | INTRAVENOUS | Status: AC
  Administered 2016-09-12: 500 [IU] via INTRAVENOUS

## 2016-09-12 MED ORDER — SODIUM CHLORIDE 0.9% FLUSH
10.0000 mL | INTRAVENOUS | Status: DC | PRN
  Administered 2016-09-12: 10 mL via INTRAVENOUS
  Filled 2016-09-12: qty 10

## 2016-10-24 ENCOUNTER — Inpatient Hospital Stay: Attending: Hematology and Oncology

## 2016-10-24 DIAGNOSIS — Z95828 Presence of other vascular implants and grafts: Secondary | ICD-10-CM

## 2016-10-24 DIAGNOSIS — Z452 Encounter for adjustment and management of vascular access device: Secondary | ICD-10-CM | POA: Diagnosis not present

## 2016-10-24 MED ORDER — HEPARIN SOD (PORK) LOCK FLUSH 100 UNIT/ML IV SOLN
500.0000 [IU] | Freq: Once | INTRAVENOUS | Status: AC
  Administered 2016-10-24: 500 [IU] via INTRAVENOUS

## 2016-10-24 MED ORDER — SODIUM CHLORIDE 0.9% FLUSH
10.0000 mL | INTRAVENOUS | Status: DC | PRN
  Administered 2016-10-24: 10 mL via INTRAVENOUS
  Filled 2016-10-24: qty 10

## 2016-12-05 ENCOUNTER — Inpatient Hospital Stay: Payer: Non-veteran care

## 2016-12-19 ENCOUNTER — Encounter: Payer: Self-pay | Admitting: Hematology and Oncology

## 2016-12-19 ENCOUNTER — Inpatient Hospital Stay (HOSPITAL_BASED_OUTPATIENT_CLINIC_OR_DEPARTMENT_OTHER): Admitting: Hematology and Oncology

## 2016-12-19 ENCOUNTER — Inpatient Hospital Stay: Attending: Hematology and Oncology

## 2016-12-19 DIAGNOSIS — Q059 Spina bifida, unspecified: Secondary | ICD-10-CM | POA: Insufficient documentation

## 2016-12-19 DIAGNOSIS — G4730 Sleep apnea, unspecified: Secondary | ICD-10-CM | POA: Diagnosis not present

## 2016-12-19 DIAGNOSIS — Z8041 Family history of malignant neoplasm of ovary: Secondary | ICD-10-CM | POA: Diagnosis not present

## 2016-12-19 DIAGNOSIS — Z79899 Other long term (current) drug therapy: Secondary | ICD-10-CM

## 2016-12-19 DIAGNOSIS — K8020 Calculus of gallbladder without cholecystitis without obstruction: Secondary | ICD-10-CM | POA: Insufficient documentation

## 2016-12-19 DIAGNOSIS — E039 Hypothyroidism, unspecified: Secondary | ICD-10-CM

## 2016-12-19 DIAGNOSIS — R569 Unspecified convulsions: Secondary | ICD-10-CM | POA: Insufficient documentation

## 2016-12-19 DIAGNOSIS — I10 Essential (primary) hypertension: Secondary | ICD-10-CM | POA: Insufficient documentation

## 2016-12-19 LAB — BASIC METABOLIC PANEL
Anion gap: 7 (ref 5–15)
BUN: 12 mg/dL (ref 6–20)
CO2: 24 mmol/L (ref 22–32)
Calcium: 9 mg/dL (ref 8.9–10.3)
Chloride: 103 mmol/L (ref 101–111)
Creatinine, Ser: 0.55 mg/dL (ref 0.44–1.00)
GFR calc Af Amer: >60 mL/min (ref >60)
GFR calc non Af Amer: >60 mL/min (ref >60)
Glucose, Bld: 87 mg/dL (ref 65–99)
Potassium: 4.3 mmol/L (ref 3.5–5.1)
Sodium: 134 mmol/L — ABNORMAL LOW (ref 135–145)

## 2016-12-19 LAB — CBC WITH DIFFERENTIAL/PLATELET
Basophils Absolute: 0 10*3/uL (ref 0–0.1)
Basophils Relative: 1 %
Eosinophils Absolute: 0.1 10*3/uL (ref 0–0.7)
Eosinophils Relative: 2 %
HCT: 33.5 % — ABNORMAL LOW (ref 35.0–47.0)
Hemoglobin: 11.7 g/dL — ABNORMAL LOW (ref 12.0–16.0)
Lymphocytes Relative: 38 %
Lymphs Abs: 2.1 10*3/uL (ref 1.0–3.6)
MCH: 33.9 pg (ref 26.0–34.0)
MCHC: 34.9 g/dL (ref 32.0–36.0)
MCV: 97.2 fL (ref 80.0–100.0)
Monocytes Absolute: 0.6 10*3/uL (ref 0.2–0.9)
Monocytes Relative: 11 %
Neutro Abs: 2.8 10*3/uL (ref 1.4–6.5)
Neutrophils Relative %: 48 %
Platelets: 230 10*3/uL (ref 150–440)
RBC: 3.45 MIL/uL — ABNORMAL LOW (ref 3.80–5.20)
RDW: 12.4 % (ref 11.5–14.5)
WBC: 5.7 10*3/uL (ref 3.6–11.0)

## 2016-12-19 LAB — FERRITIN: Ferritin: 25 ng/mL (ref 11–307)

## 2016-12-19 LAB — HEPATIC FUNCTION PANEL
ALT: 19 U/L (ref 14–54)
AST: 20 U/L (ref 15–41)
Albumin: 4.1 g/dL (ref 3.5–5.0)
Alkaline Phosphatase: 57 U/L (ref 38–126)
Bilirubin, Direct: <0.1 mg/dL — ABNORMAL LOW (ref 0.1–0.5)
Total Bilirubin: 0.6 mg/dL (ref 0.3–1.2)
Total Protein: 7.4 g/dL (ref 6.5–8.1)

## 2016-12-19 MED ORDER — SODIUM CHLORIDE 0.9% FLUSH
10.0000 mL | Freq: Once | INTRAVENOUS | Status: AC
  Administered 2016-12-19: 10 mL via INTRAVENOUS
  Filled 2016-12-19: qty 10

## 2016-12-19 MED ORDER — HEPARIN SOD (PORK) LOCK FLUSH 100 UNIT/ML IV SOLN
500.0000 [IU] | Freq: Once | INTRAVENOUS | Status: AC
  Administered 2016-12-19: 500 [IU] via INTRAVENOUS

## 2016-12-19 NOTE — Progress Notes (Signed)
Patient offers no concerns today. 

## 2016-12-19 NOTE — Progress Notes (Signed)
[facility] day:  12/19/16  Chief Complaint: Christina Stephens is a 41 y.o. female with hemochromatosis who is seen for 6 month assessment.  HPI:  The patient was last seen in the medical oncology clinic on 06/20/2016.  At that time, she noted minor left wrist pain. Her diet and appetite were good.   Ferritin was 34. She continued Exjade.   RUQ ultrasound on 08/01/2016 revealed multiple gallstones and no focal hepatic abnormality.  Patient had a ultrasound performed 11/22/2016.  Symptomatically, she denies any new complaints.  She denies any RUQ symptoms associated with her gallstones.  Her wrists are not bothering her as she is using a splint at night.   Past Medical History:  Diagnosis Date  . CPAP (continuous positive airway pressure) dependence   . Hereditary hemochromatosis ([place])   . Hypertension   . Seizures ([place])   . Sleep apnea   . Spina bifida ([place])   . Thyroid disease    hypothyroidism    History reviewed. No pertinent surgical history.  Family History  Problem Relation Age of Onset  . Hypertension Father   . CAD Father   . Arthritis Sister   . Ovarian cancer Maternal Aunt   . CAD Paternal Grandfather     Social History:  reports that she has never smoked. She has never used smokeless tobacco. She reports that she does not drink alcohol or use drugs.  She lives in [place].  The patient is alone today.  Allergies:  Allergies  Allergen Reactions  . Latex     Pt has Spina bifada and was told that if she has surgery she should avoid latex for poss. Reaction/infection.    Current Medications: Current Outpatient Prescriptions  Medication Sig Dispense Refill  . bisoprolol (ZEBETA) 5 MG tablet Take 5 mg by mouth daily.  4  . CALCIUM-VITAMIN D PO Take 1 tablet by mouth daily.    Marland Kitchen deferasirox (EXJADE) 250 MG disintegrating tablet Take 1 tablet (250 mg total) by mouth daily before breakfast. 30 tablet 11  . levothyroxine  (SYNTHROID, LEVOTHROID) 50 MCG tablet Take 50 mcg by mouth daily.  5  . phenytoin (DILANTIN) 100 MG ER capsule TAKE 2 CAPSULES (200 MG TOTAL) BY MOUTH 2 (TWO) TIMES DAILY.  11  . traMADol (ULTRAM) 50 MG tablet Take 1 tablet (50 mg total) by mouth every 6 (six) hours as needed for moderate pain. 12 tablet 0   No current facility-administered medications for this visit.    Facility-Administered Medications Ordered in Other Visits  Medication Dose Route Frequency Provider Last Rate Last Dose  . sodium chloride flush (NS) 0.9 % injection 10 mL  10 mL Intravenous PRN Cammie Sickle, MD   10 mL at 02/26/16 2376    Review of Systems:  GENERAL:  Feels good.  No fevers or sweats.  Weight stable. PERFORMANCE STATUS (ECOG):  2 HEENT:  No visual changes, runny nose, sore throat, mouth sores or tenderness. Lungs: No shortness of breath or cough.  No hemoptysis. Cardiac:  No chest pain, palpitations, orthopnea, or PND. GI:  No nausea, vomiting, diarrhea, constipation, melena or hematochezia. GU:  No urgency, frequency, dysuria, or hematuria.  Normal menses. Musculoskeletal:  No back pain.  No joint pain. No muscle tenderness. Extremities: Left wrist pain improved with night splints. No pain or swelling. Skin:  No rashes or skin changes. Neuro:  Spina bifida.  No seizure since 2011.  No headache, focal numbness  or weakness. Endocrine:  No diabetes.  Thyroid issues on Synthroid.  No hot flashes or night sweats. Psych:  No mood changes, depression or anxiety. Pain:  No focal pain. Review of systems:  All other systems reviewed and found to be negative.  Physical Exam: Blood pressure 126/68, pulse 62, temperature 97.6 F (36.4 C), temperature source Tympanic, resp. rate 18, weight 122 lb 3 oz (55.4 kg). GENERAL:  Well developed, well nourished, woman sitting comfortably in the exam room in no acute distress.  She has crutches at her side. MENTAL STATUS:  Alert and oriented to person, place and  time. HEAD: Shoulder length dark slightly wavy hair.  Normocephalic, atraumatic, face symmetric, no Cushingoid features. EYES:  Brown/hazel eyes.  Pupils equal round and reactive to light and accomodation.  No conjunctivitis or scleral icterus. ENT:  Oropharynx clear without lesion.  Tongue normal. Mucous membranes moist.  RESPIRATORY:  Clear to auscultation without rales, wheezes or rhonchi. CARDIOVASCULAR:  Regular rate and rhythm without murmur, rub or gallop. CHEST:  Right sided port-a-cath.  Left sided shunt. ABDOMEN:  Soft, non-tender, with active bowel sounds, and no hepatosplenomegaly.  No masses. SKIN:  No rashes, ulcers or lesions. EXTREMITIES:  Leg braces.  Legs cool.  No edema, no skin discoloration or tenderness.  No palpable cords. LYMPH NODES: No palpable cervical, supraclavicular, axillary or inguinal adenopathy  NEUROLOGICAL: Unremarkable. PSYCH:  Appropriate.   Infusion on 12/19/2016  Component Date Value Ref Range Status  . WBC 12/19/2016 5.7  3.6 - 11.0 K/uL Final  . RBC 12/19/2016 3.45* 3.80 - 5.20 MIL/uL Final  . Hemoglobin 12/19/2016 11.7* 12.0 - 16.0 g/dL Final  . HCT 12/19/2016 33.5* 35.0 - 47.0 % Final  . MCV 12/19/2016 97.2  80.0 - 100.0 fL Final  . MCH 12/19/2016 33.9  26.0 - 34.0 pg Final  . MCHC 12/19/2016 34.9  32.0 - 36.0 g/dL Final  . RDW 12/19/2016 12.4  11.5 - 14.5 % Final  . Platelets 12/19/2016 230  150 - 440 K/uL Final  . Neutrophils Relative % 12/19/2016 48  % Final  . Neutro Abs 12/19/2016 2.8  1.4 - 6.5 K/uL Final  . Lymphocytes Relative 12/19/2016 38  % Final  . Lymphs Abs 12/19/2016 2.1  1.0 - 3.6 K/uL Final  . Monocytes Relative 12/19/2016 11  % Final  . Monocytes Absolute 12/19/2016 0.6  0.2 - 0.9 K/uL Final  . Eosinophils Relative 12/19/2016 2  % Final  . Eosinophils Absolute 12/19/2016 0.1  0 - 0.7 K/uL Final  . Basophils Relative 12/19/2016 1  % Final  . Basophils Absolute 12/19/2016 0.0  0 - 0.1 K/uL Final  . Total Protein  12/19/2016 7.4  6.5 - 8.1 g/dL Final  . Albumin 12/19/2016 4.1  3.5 - 5.0 g/dL Final  . AST 12/19/2016 20  15 - 41 U/L Final  . ALT 12/19/2016 19  14 - 54 U/L Final  . Alkaline Phosphatase 12/19/2016 57  38 - 126 U/L Final  . Total Bilirubin 12/19/2016 0.6  0.3 - 1.2 mg/dL Final  . Bilirubin, Direct 12/19/2016 <0.1* 0.1 - 0.5 mg/dL Final  . Indirect Bilirubin 12/19/2016 NOT CALCULATED  0.3 - 0.9 mg/dL Final    Assessment:  RAHA TENNISON is a 41 y.o. female with hereditary hemochromatosis diagnosed in 11/2009.  She presented with elevated serum ferritin level of 3157.  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    She began a phlebotomy program in 11/2009.  She was also started  on Exjade (deferasirox).  She was inititally on 20 mg/kg (1250 mg once daily), then dose decreased to 750 mg daily.  Exjade was held in 11/2012, then resumed at a low dose 250 mg daily in 04/2013.  Ferritin has remained low:  13 on 10/20/2014, 22 on 11/28/2014, 16 on 01/02/2015, 23 on 03/27/2015, 32 on 06/19/2015, 30 on 12/18/2015, 34 on 04/08/2016, and 34 on 06/20/2016.  AFP has remained normal:  1.9 on 05/07/2010, 2.9 on 11/19/2010, 2.5 on 06/17/2011, 2.8 on 03/16/2012, 2.2 on 04/12/2013, 3.2 on 12/20/2013, 2.3 on 01/02/2015, 2.9 on 12/18/2015, and 3.2 on 06/20/2016.  Labs on 06/19/2015 revealed a hematocrit of 36.2, hemoglobin 12.7, and MCV 95.9.  Liver function tests were normal.  Ferritin was 32.  Iron studies revealed an iron saturation of 60%, TIBC 228, and iron 138.  Labs today revealed hematocrit 33.4, hemoglobin 11.8 and MCV 96.5. Liver function tests are normal. Ferritin is 34. Will re-check CBC at next post flush due to a slight decrease in hematocrit from August 2017. Patient states that she just has her menstrual cycle last week.   RUQ ultrasound on 08/01/2016 revealed multiple gallstones and no focal hepatic abnormality.   She has spina bifida and is s/p shunt.  She has a  port-a-cath.  Symptomatically, she denies any complaint.  Hematocrit is 33.5 with a hemoglobin of 11.7.  Plan: 1.  Labs today:  CBC with diff, CMP, ferritin, AFP. 2.  Review interim labs and imaging studies.  She continues to do well.  Discuss continuation of Exjade. No need for phlebotomy.  Goal ferritin < 50.  Await ferritin today.  Continue AFP checks and liver ultrasound every 6 months. 3.  Schedule limited abdominal ultrasound 6 months after last ultrasound (adjust date based on outside ultrasound). 4.  Continue Exjade. 5.  Port flush every 6-8 weeks.  Check CBC at that time. 6.  RTC in 6 months for MD assessment and labs (CBC with diff, CMP, ferritin, AFP).  Addendum:  Outside ultrasound requested.   Lequita Asal, MD 12/19/2016, 10:46 AM

## 2016-12-20 LAB — AFP TUMOR MARKER: AFP, Serum, Tumor Marker: 2.7 ng/mL (ref 0.0–8.3)

## 2017-02-06 ENCOUNTER — Inpatient Hospital Stay

## 2017-02-06 ENCOUNTER — Inpatient Hospital Stay: Attending: Hematology and Oncology

## 2017-02-06 ENCOUNTER — Other Ambulatory Visit: Payer: Self-pay | Admitting: Urgent Care

## 2017-02-06 DIAGNOSIS — Z0000 Encounter for general adult medical examination without abnormal findings: Secondary | ICD-10-CM

## 2017-02-06 DIAGNOSIS — Z23 Encounter for immunization: Secondary | ICD-10-CM | POA: Insufficient documentation

## 2017-02-06 DIAGNOSIS — Z452 Encounter for adjustment and management of vascular access device: Secondary | ICD-10-CM | POA: Insufficient documentation

## 2017-02-06 DIAGNOSIS — Z95828 Presence of other vascular implants and grafts: Secondary | ICD-10-CM

## 2017-02-06 MED ORDER — INFLUENZA VAC SPLIT QUAD 0.5 ML IM SUSY: 0.5000 mL | PREFILLED_SYRINGE | Freq: Once | INTRAMUSCULAR | Status: DC

## 2017-02-06 MED ORDER — INFLUENZA VAC SPLIT QUAD 0.5 ML IM SUSY
0.5000 mL | PREFILLED_SYRINGE | Freq: Once | INTRAMUSCULAR | Status: AC
  Administered 2017-02-06: 0.5 mL via INTRAMUSCULAR
  Filled 2017-02-06: qty 0.5

## 2017-02-06 MED ORDER — SODIUM CHLORIDE 0.9% FLUSH
10.0000 mL | INTRAVENOUS | Status: AC | PRN
  Administered 2017-02-06: 10 mL
  Filled 2017-02-06: qty 10

## 2017-02-06 MED ORDER — HEPARIN SOD (PORK) LOCK FLUSH 100 UNIT/ML IV SOLN
500.0000 [IU] | INTRAVENOUS | Status: AC | PRN
  Administered 2017-02-06: 500 [IU]

## 2017-03-27 ENCOUNTER — Inpatient Hospital Stay: Attending: Hematology and Oncology

## 2017-03-27 DIAGNOSIS — Z452 Encounter for adjustment and management of vascular access device: Secondary | ICD-10-CM | POA: Insufficient documentation

## 2017-03-27 DIAGNOSIS — Z95828 Presence of other vascular implants and grafts: Secondary | ICD-10-CM

## 2017-03-27 MED ORDER — HEPARIN SOD (PORK) LOCK FLUSH 100 UNIT/ML IV SOLN
500.0000 [IU] | INTRAVENOUS | Status: AC | PRN
  Administered 2017-03-27: 500 [IU]

## 2017-03-27 MED ORDER — SODIUM CHLORIDE 0.9% FLUSH
10.0000 mL | INTRAVENOUS | Status: AC | PRN
  Administered 2017-03-27: 10 mL
  Filled 2017-03-27: qty 10

## 2017-05-15 ENCOUNTER — Inpatient Hospital Stay: Attending: Hematology and Oncology

## 2017-05-15 DIAGNOSIS — Z95828 Presence of other vascular implants and grafts: Secondary | ICD-10-CM

## 2017-05-15 MED ORDER — SODIUM CHLORIDE 0.9% FLUSH
10.0000 mL | INTRAVENOUS | Status: AC | PRN
  Administered 2017-05-15: 10 mL
  Filled 2017-05-15: qty 10

## 2017-05-15 MED ORDER — HEPARIN SOD (PORK) LOCK FLUSH 100 UNIT/ML IV SOLN
500.0000 [IU] | INTRAVENOUS | Status: AC | PRN
  Administered 2017-05-15: 500 [IU]

## 2017-05-30 ENCOUNTER — Encounter: Payer: Self-pay | Admitting: Nurse Practitioner

## 2017-05-30 ENCOUNTER — Ambulatory Visit (INDEPENDENT_AMBULATORY_CARE_PROVIDER_SITE_OTHER): Payer: PRIVATE HEALTH INSURANCE | Admitting: Nurse Practitioner

## 2017-05-30 VITALS — BP 117/70 | HR 66 | Resp 16 | Ht <= 58 in | Wt 121.0 lb

## 2017-05-30 DIAGNOSIS — I10 Essential (primary) hypertension: Secondary | ICD-10-CM | POA: Diagnosis not present

## 2017-05-30 DIAGNOSIS — Z5181 Encounter for therapeutic drug level monitoring: Secondary | ICD-10-CM

## 2017-05-30 DIAGNOSIS — G4737 Central sleep apnea in conditions classified elsewhere: Secondary | ICD-10-CM

## 2017-05-30 DIAGNOSIS — Z1231 Encounter for screening mammogram for malignant neoplasm of breast: Secondary | ICD-10-CM

## 2017-05-30 DIAGNOSIS — Z0001 Encounter for general adult medical examination with abnormal findings: Secondary | ICD-10-CM | POA: Diagnosis not present

## 2017-05-30 DIAGNOSIS — Z1239 Encounter for other screening for malignant neoplasm of breast: Secondary | ICD-10-CM

## 2017-05-30 DIAGNOSIS — Q059 Spina bifida, unspecified: Secondary | ICD-10-CM | POA: Diagnosis not present

## 2017-05-30 NOTE — Progress Notes (Addendum)
[facility] [place], [place] [address]  Internal MEDICINE  Office Visit Note  Patient Name: Christina Stephens  [id number]  [id number]  Date of Service: 05/30/2017  No chief complaint on file.    The patient is here for routine health maintenance exam today. She is reporting no problems or concerns. She has purchased a "so-clean" for her CPAP machine. States that it works well and is very convenient.    Pt is here for routine health maintenance examination  Current Medication: Outpatient Encounter Medications as of 05/30/2017  Medication Sig Note  . bisoprolol (ZEBETA) 5 MG tablet Take 5 mg by mouth daily. 01/02/2015: Received from: External Pharmacy  . deferasirox (EXJADE) 250 MG disintegrating tablet Take 1 tablet (250 mg total) by mouth daily before breakfast.   . levothyroxine (SYNTHROID, LEVOTHROID) 50 MCG tablet Take 50 mcg by mouth daily. 01/02/2015: Received from: External Pharmacy  . phenytoin (DILANTIN) 100 MG ER capsule TAKE 2 CAPSULES (200 MG TOTAL) BY MOUTH 2 (TWO) TIMES DAILY. 01/02/2015: Received from: External Pharmacy  . polyethylene glycol (MIRALAX / GLYCOLAX) packet 1 POWDER PACK IN 12 OZ WATER DAILY FOR CONSTIPATION   . CALCIUM-VITAMIN D PO Take 1 tablet by mouth daily.   . traMADol (ULTRAM) 50 MG tablet Take 1 tablet (50 mg total) by mouth every 6 (six) hours as needed for moderate pain. (Patient not taking: Reported on 05/30/2017)    Facility-Administered Encounter Medications as of 05/30/2017  Medication  . sodium chloride flush (NS) 0.9 % injection 10 mL    Surgical History: Past Surgical History:  Procedure Laterality Date  . VP STUNT HEAD SURGERYOther]  2000    Medical History: Past Medical History:  Diagnosis Date  . CPAP (continuous positive airway pressure) dependence   . Hereditary hemochromatosis ([place])   . Hypertension   . Seizures ([place])   . Sleep apnea   . Spina bifida ([place])   . Thyroid disease    hypothyroidism     Family History: Family History  Problem Relation Age of Onset  . Ovarian cancer Maternal Aunt   . Hypertension Father   . CAD Father   . Arthritis Sister   . CAD Paternal Grandfather       Review of Systems  Constitutional: Negative for activity change, chills, fatigue and unexpected weight change.  HENT: Negative for congestion, postnasal drip, rhinorrhea, sneezing and sore throat.   Eyes: Negative.  Negative for redness.  Respiratory: Positive for apnea. Negative for cough, chest tightness, shortness of breath and wheezing.   Cardiovascular: Negative for chest pain and palpitations.  Gastrointestinal: Negative for abdominal pain, constipation, diarrhea, nausea and vomiting.  Endocrine: Negative for cold intolerance, heat intolerance, polydipsia, polyphagia and polyuria.  Genitourinary: Negative for dysuria and frequency.       Menstrual cycle started 05/29/2017  Musculoskeletal: Positive for gait problem. Negative for arthralgias, back pain, joint swelling and neck pain.  Skin: Negative.  Negative for rash.  Allergic/Immunologic: Negative.   Neurological: Positive for seizures. Negative for tremors and numbness.  Hematological: Negative for adenopathy. Does not bruise/bleed easily.  Psychiatric/Behavioral: Negative for behavioral problems (Depression), sleep disturbance and suicidal ideas. The patient is not nervous/anxious.      Today's Vitals   05/30/17 0927  BP: 117/70  Pulse: 66  Resp: 16  SpO2: 100%  Weight: 121 lb (54.9 kg)  Height: 4' 7"  (1.397 m)    Physical Exam  Constitutional: She is oriented to person, place, and time. She appears  well-developed and well-nourished. No distress.  HENT:  Head: Normocephalic and atraumatic.  Mouth/Throat: Oropharynx is clear and moist. No oropharyngeal exudate.  Eyes: Conjunctivae and EOM are normal. Pupils are equal, round, and reactive to light.  Neck: Normal range of motion. Neck supple. No JVD present. No tracheal  deviation present. No thyromegaly present.  Cardiovascular: Normal rate, regular rhythm, normal heart sounds and intact distal pulses. Exam reveals no gallop and no friction rub.  No murmur heard. Pulmonary/Chest: Effort normal and breath sounds normal. No respiratory distress. She has no wheezes. She has no rales. She exhibits no tenderness.  Abdominal: Soft. Bowel sounds are normal. There is no tenderness.  Musculoskeletal: Normal range of motion.  Lymphadenopathy:    She has no cervical adenopathy.  Neurological: She is alert and oriented to person, place, and time. No cranial nerve deficit.  Patient is at her neurological baseline.   Skin: Skin is warm and dry. She is not diaphoretic.  Psychiatric: She has a normal mood and affect. Her behavior is normal. Judgment and thought content normal.  Nursing note and vitals reviewed.    Assessment/Plan:  1. Encounter for general adult medical examination with abnormal findings Annual wellness visit today. Routine, fasting blood work ordered.   2. Screening for breast cancer - MM Digital Screening; Future  3. Encounter for therapeutic drug level monitoring Dilantin level ordered with labs.   4. Essential hypertension Stable. Continue bp medication as prescribed.   5. 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 Regular visits with Dr. Devona Konig for CPAP management.   6. Spina bifida, unspecified hydrocephalus presence, unspecified spinal region ([place]) Stable.    General Counseling: Christina Stephens verbalizes understanding of the findings of todays visit and agrees with plan of treatment. I have discussed any further diagnostic evaluation that may be needed or ordered today. We also reviewed her medications today. she has been encouraged to call the office with any questions or concerns that should arise related to todays visit.    Counseling:    Time spent: [place], MD  Internal  Medicine

## 2017-05-30 NOTE — Patient Instructions (Signed)
Cancer Screening for Women A cancer screening is a test or exam that checks for cancer. Your health care provider will recommend specific cancer screenings based on your age, personal history, and family history of cancer. Work with your health care provider to create a cancer screening schedule that protects your health. Why is cancer screening done? Cancer screening is done to look for cancer in the very early stages, before it spreads and becomes harder to treat and before you would start to notice symptoms. Finding cancer early improves the chances of successful treatment. It may save your life. Who should be screened for cancer? All women should be screened for certain cancers, including breast cancer, cervical cancer, and skin cancer. Your health care provider may recommend screenings for other types of cancer if:  You had cancer before.  You have a family member with cancer.  You have abnormal genes that could increase the risk of cancer.  You have risk factors for certain cancers, such as smoking.  When you should be screened for cancer depends on:  Your age.  Your medical history and your family's medical history.  Certain lifestyle factors, such as smoking.  Environmental exposure, such as to asbestos.  What are some common cancer screenings? Breast cancer Breast cancer screening is done with a test that takes images of breast tissue (mammogram). Here are some screening guidelines:  When you are age 65-44. you will be given the choice to start having mammograms.  When you are age 79-54, you should have a mammogram every year.  You may start having mammograms before age 55 if you have risk factors for breast cancer, such as having an immediate family member with breast cancer.  When you are age 58 or older, you should have a mammogram every 1-2 years for as long as you are in good health and have a life expectancy of 10 years or more.  It is important to know what your  breasts look and feel like so you can report any changes to your health care provider.  Cervical cancer Cervical cancer screening is done with a Pap test. This testchecks for abnormalities, including the virus that causes cervical cancer (human papillomavirus, or HPV). To perform the test, a health care provider takes a swab of cervical cells during a pelvic exam. Screening for cervical cancer with a Pap test should start at age 17. Here are some screening guidelines:  When you are age 69-29, you should have a Pap test every 3 years.  When you are age 65-65, you should have a Pap test and HPV test every 5 years or have a Pap test every 3 years.  You may be screened for cervical cancer more often if you have risk factors for cervical cancer.  If your Pap tests are abnormal, you may have an HPV test.  If you have had the HPV vaccine, you will still be screened for cervical cancer and follow normal screening recommendations.  You do not need to be screened for cervical cancer if any of the following apply to you:  You are older than age 70 and you have not had a serious cervical precancer or cancer in the last 20 years.  Your cervix and uterus have been removed and you have never had cervical cancer or precancerous cells.  Endometrial cancer There is no standard screening test for endometrial cancer, but the cancer can be detected with:  A test of a sample of tissue taken from the lining of  the uterus (endometrial tissue biopsy).  A vaginal ultrasound.  Pap tests.  If you are at increased risk for endometrial cancer, you may need to have these tests more often than normal. You are at increased risk if:  You have a family history of ovarian, uterine, or colon cancer.  You are taking tamoxifen, a drug that is used to treat breast cancer.  You have certain types of colon cancer.  If you have reached menopause, it is especially important to talk with your health care provider about any  vaginal bleeding or spotting. Screening for endometrial cancer is not recommended for women who do not have symptoms of the cancer, such as vaginal bleeding. Colorectal cancer Screening for colorectal cancer is recommended starting at age 50 for most women. If you have a family history of colon or rectal cancer or other risk factors, you may need to start having screenings earlier. Talk with your health care provider about which screening test is right for you and how often you should be screened. Colorectal cancer screening looks for cancer or for growths called polyps that often form before cancer starts. Tests to look for cancer or polyps include:  Colonoscopy or flexible sigmoidoscopy. For these procedures, a flexible tube with a small camera is inserted into the rectum.  CT colonography. This test uses X-rays and a contrast dye to check the colon for polyps. If a polyp is found, you may need to have a colonoscopy so the polyp can be located and removed.  Tests to look for cancer in the stool (feces) include:  Guaiac-based fecal occult blood test (FOBT). This test detects blood in stool. It can be done at home with a kit.  Fecal immunochemical test (FIT). This test detects blood in stool. For this test, you will need to collect stool samples at home.  Stool DNA test. This test looks for blood in stool and any changes in DNA that can lead to colon cancer. For this test, you will need to collect a stool sample at home and send it to a lab.  Skin cancer Skin cancer screening is done by checking the skin for unusual moles or spots and any changes in existing moles. Your health care provider should check your skin for signs of skin cancer at every physical exam. You should check your skin every month and tell your health care provider right away if anything looks unusual. Women with a higher-than-normal risk for skin cancer may want to see a skin specialist (dermatologist) for an annual body  check. Lung cancer Lung cancer screening is done with a CT scan that looks for abnormal cells in the lungs. Discuss lung cancer screening with your health care provider if you are 55-74 years old and if any of the following apply to you:  You currently smoke.  You used to smoke heavily.  You have had at least a 30-pack-year smoking history.  You have quit smoking within the past 15 years.  If you smoke heavily or if you used to smoke, you may need to be screened every year. Where to find more information:  National Cancer Institute: https://www.cancer.gov/about-cancer/screening  Centers for Disease Control and Prevention: https://www.cdc.gov/cancer/dcpc/prevention/screening.htm  Department of Health and Human Services: https://www.womenshealth.gov/screening-tests-and-vaccines/screening-tests-for-women Contact a health care provider if:  You have concerns about any signs or symptoms of cancer, such as: ? Moles that have an unusual shape or color. ? Changes in existing moles. ? A sore on your skin that does not heal. ? Blood   in your stool. ? Fatigue that does not go away. ? Frequent pain or cramping in your abdomen. ? Coughing, or coughing up blood. ? Losing weight without trying. ? Lumps or other changes in your breasts. ? Vaginal bleeding, spotting, or changes in your periods. Summary  Be aware of and watch for signs and symptoms of cancer, especially symptoms of breast cancer, cervical cancer, endometrial cancer, colorectal cancer, skin cancer, and lung cancer.  Early detection of cancer with cancer screening may save your life.  Talk with your health care provider about your specific cancer risks.  Work together with your health care provider to create a cancer screening plan that is right for you. This information is not intended to replace advice given to you by your health care provider. Make sure you discuss any questions you have with your health care  provider. Document Released: 01/21/2016 Document Revised: 01/21/2016 Document Reviewed: 01/21/2016 Elsevier Interactive Patient Education  [employer].

## 2017-05-31 DIAGNOSIS — I10 Essential (primary) hypertension: Secondary | ICD-10-CM | POA: Insufficient documentation

## 2017-05-31 DIAGNOSIS — Q059 Spina bifida, unspecified: Secondary | ICD-10-CM | POA: Insufficient documentation

## 2017-05-31 DIAGNOSIS — G4730 Sleep apnea, unspecified: Secondary | ICD-10-CM | POA: Insufficient documentation

## 2017-06-14 ENCOUNTER — Other Ambulatory Visit
Admission: RE | Admit: 2017-06-14 | Discharge: 2017-06-14 | Disposition: A | Discharge status: Home / Self Care | Source: Ambulatory Visit | Attending: Nurse Practitioner | Admitting: Nurse Practitioner

## 2017-06-14 ENCOUNTER — Ambulatory Visit
Admission: RE | Admit: 2017-06-14 | Discharge: 2017-06-14 | Disposition: A | Discharge status: Home / Self Care | Source: Ambulatory Visit | Attending: Hematology and Oncology | Admitting: Hematology and Oncology

## 2017-06-14 DIAGNOSIS — E559 Vitamin D deficiency, unspecified: Secondary | ICD-10-CM | POA: Insufficient documentation

## 2017-06-14 DIAGNOSIS — K8020 Calculus of gallbladder without cholecystitis without obstruction: Secondary | ICD-10-CM | POA: Insufficient documentation

## 2017-06-14 DIAGNOSIS — Z5181 Encounter for therapeutic drug level monitoring: Secondary | ICD-10-CM | POA: Diagnosis not present

## 2017-06-14 DIAGNOSIS — Z0000 Encounter for general adult medical examination without abnormal findings: Secondary | ICD-10-CM | POA: Diagnosis present

## 2017-06-14 LAB — COMPREHENSIVE METABOLIC PANEL
ALT: 19 U/L (ref 14–54)
AST: 25 U/L (ref 15–41)
Albumin: 4.5 g/dL (ref 3.5–5.0)
Alkaline Phosphatase: 62 U/L (ref 38–126)
Anion gap: 10 (ref 5–15)
BUN: 17 mg/dL (ref 6–20)
CHLORIDE: 103 mmol/L (ref 101–111)
CO2: 25 mmol/L (ref 22–32)
Calcium: 9.2 mg/dL (ref 8.9–10.3)
Creatinine, Ser: 0.57 mg/dL (ref 0.44–1.00)
Glucose, Bld: 84 mg/dL (ref 65–99)
POTASSIUM: 4 mmol/L (ref 3.5–5.1)
Sodium: 138 mmol/L (ref 135–145)
Total Bilirubin: 0.6 mg/dL (ref 0.3–1.2)
Total Protein: 8.1 g/dL (ref 6.5–8.1)

## 2017-06-14 LAB — LIPID PANEL
CHOL/HDL RATIO: 2.5 ratio
Cholesterol: 145 mg/dL (ref 0–200)
HDL: 59 mg/dL (ref >40)
LDL Cholesterol: 72 mg/dL (ref 0–99)
TRIGLYCERIDES: 68 mg/dL (ref <150)
VLDL: 14 mg/dL (ref 0–40)

## 2017-06-14 LAB — TSH: TSH: 0.36 u[IU]/mL (ref 0.350–4.500)

## 2017-06-14 LAB — CBC
HCT: 38.1 % (ref 35.0–47.0)
Hemoglobin: 13.2 g/dL (ref 12.0–16.0)
MCH: 33.7 pg (ref 26.0–34.0)
MCHC: 34.6 g/dL (ref 32.0–36.0)
MCV: 97.6 fL (ref 80.0–100.0)
Platelets: 248 10*3/uL (ref 150–440)
RBC: 3.9 MIL/uL (ref 3.80–5.20)
RDW: 12.5 % (ref 11.5–14.5)
WBC: 5.1 10*3/uL (ref 3.6–11.0)

## 2017-06-14 LAB — PHENYTOIN LEVEL, TOTAL: PHENYTOIN LVL: 22.5 ug/mL — AB (ref 10.0–20.0)

## 2017-06-14 LAB — T4, FREE: Free T4: 0.94 ng/dL (ref 0.61–1.12)

## 2017-06-15 LAB — VITAMIN D 25 HYDROXY (VIT D DEFICIENCY, FRACTURES): Vit D, 25-Hydroxy: 48.4 ng/mL (ref 30.0–100.0)

## 2017-06-19 ENCOUNTER — Other Ambulatory Visit: Payer: Self-pay | Admitting: Hematology and Oncology

## 2017-06-19 ENCOUNTER — Inpatient Hospital Stay

## 2017-06-19 ENCOUNTER — Inpatient Hospital Stay (HOSPITAL_BASED_OUTPATIENT_CLINIC_OR_DEPARTMENT_OTHER): Admitting: Nurse Practitioner

## 2017-06-19 ENCOUNTER — Ambulatory Visit
Admission: RE | Admit: 2017-06-19 | Discharge: 2017-06-19 | Disposition: A | Discharge status: Home / Self Care | Source: Ambulatory Visit | Attending: Oncology | Admitting: Oncology

## 2017-06-19 ENCOUNTER — Other Ambulatory Visit: Payer: Self-pay

## 2017-06-19 ENCOUNTER — Telehealth: Payer: Self-pay | Admitting: *Deleted

## 2017-06-19 ENCOUNTER — Encounter

## 2017-06-19 ENCOUNTER — Inpatient Hospital Stay: Attending: Hematology and Oncology

## 2017-06-19 ENCOUNTER — Inpatient Hospital Stay: Admitting: Hematology and Oncology

## 2017-06-19 ENCOUNTER — Encounter: Payer: Self-pay | Admitting: Nurse Practitioner

## 2017-06-19 DIAGNOSIS — W19XXXA Unspecified fall, initial encounter: Secondary | ICD-10-CM | POA: Insufficient documentation

## 2017-06-19 DIAGNOSIS — S0993XA Unspecified injury of face, initial encounter: Secondary | ICD-10-CM | POA: Diagnosis present

## 2017-06-19 DIAGNOSIS — Z95828 Presence of other vascular implants and grafts: Secondary | ICD-10-CM

## 2017-06-19 DIAGNOSIS — Q059 Spina bifida, unspecified: Secondary | ICD-10-CM

## 2017-06-19 DIAGNOSIS — Z982 Presence of cerebrospinal fluid drainage device: Secondary | ICD-10-CM | POA: Diagnosis not present

## 2017-06-19 LAB — FERRITIN: Ferritin: 37 ng/mL (ref 11–307)

## 2017-06-19 MED ORDER — SODIUM CHLORIDE 0.9% FLUSH
10.0000 mL | INTRAVENOUS | Status: DC | PRN
  Administered 2017-06-19: 10 mL via INTRAVENOUS
  Filled 2017-06-19: qty 10

## 2017-06-19 MED ORDER — HEPARIN SOD (PORK) LOCK FLUSH 100 UNIT/ML IV SOLN
500.0000 [IU] | Freq: Once | INTRAVENOUS | Status: AC
  Administered 2017-06-19: 500 [IU] via INTRAVENOUS

## 2017-06-19 NOTE — Progress Notes (Signed)
Patient presents to clinic today for routine follow-up. She came early for her apt and decided to r/s this apt. She admits to being anxious about having to stay for this long until her 1:30pm apt time. She states that her mother dropped her off for this appointment. She will need to contact her mother to come back and get her. She ambulates on crutches due to H/o spina bifida.    Patient added to symptom mgmt clinic due to 'fall.' She made an attempt to transfer with crutches to the chair at the scheduling desk. She slide in the chair and hit her jaw on the scheduling desk. Per patient,  She denies any syncope event or epilepsy. However, the schedulers stated that they noted some tonic like jerking sensations in patient's right arm and face that lasted about 10 seconds.   Pt denies any pain.  Pt reports mild Bleeding noted in gums.  BP noted  131/76; HR 64.  Patient placed in w/c. Fall bractlet placed on pt's arm.

## 2017-06-19 NOTE — Telephone Encounter (Signed)
-----   Message from Lequita Asal, MD sent at 06/19/2017  4:28 PM EST ----- Regarding: Please call patient with ferritin level  Ferritin is good (37).  Continue Exjade.  Let's make sure Dr Chancy Milroy knows about gallstones.  She will be due for a follow-up RUQ ultrasound on 12/12/2017.  M   ----- Message ----- From: Interface, Lab In [place] Sent: 06/19/2017   2:56 PM To: Lequita Asal, MD

## 2017-06-19 NOTE — Progress Notes (Deleted)
[facility] day:  06/19/17  Chief Complaint: Christina Stephens is a 42 y.o. female with hemochromatosis who is seen for 6 month assessment.  HPI:  The patient was last seen in the medical oncology clinic on 12/19/2016.  At that time, she denied any complaint.  Hematocrit was 33.5 with a hemoglobin of 11.7.  Ferritin was 25. AFP was 2.7.  She continued Exjade.   CBC on 06/14/2017 revealed a hematocrit of 38.1, hemoglobin 13.2, MCV 97.6, platelets 248,000, and WBC 5100.  CMP was normal.  Vitamin D level was normal.  Dilantin level was 2.5 (10-20).  TSH and free T4 were normal.  RUQ ultrasound on 06/14/2016 revealed cholelithiasis.  There was no sonographic evidence of acute cholecystitis.  There was no focal hepatic abnormality.  During the interim,   Past Medical History:  Diagnosis Date  . CPAP (continuous positive airway pressure) dependence   . Hereditary hemochromatosis ([place])   . Hypertension   . Seizures ([place])   . Sleep apnea   . Spina bifida ([place])   . Thyroid disease    hypothyroidism    Past Surgical History:  Procedure Laterality Date  . VP STUNT HEAD SURGERYOther]  2000    Family History  Problem Relation Age of Onset  . Ovarian cancer Maternal Aunt   . Hypertension Father   . CAD Father   . Arthritis Sister   . CAD Paternal Grandfather     Social History:  reports that  has never smoked. she has never used smokeless tobacco. She reports that she does not drink alcohol or use drugs.  She lives in [place].  The patient is alone today.  Allergies:  Allergies  Allergen Reactions  . Latex     Pt has Spina bifada and was told that if she has surgery she should avoid latex for poss. Reaction/infection.    Current Medications: Current Outpatient Medications  Medication Sig Dispense Refill  . bisoprolol (ZEBETA) 5 MG tablet Take 5 mg by mouth daily.  4  . CALCIUM-VITAMIN D PO Take 1 tablet by mouth daily.    Marland Kitchen deferasirox  (EXJADE) 250 MG disintegrating tablet Take 1 tablet (250 mg total) by mouth daily before breakfast. 30 tablet 11  . levothyroxine (SYNTHROID, LEVOTHROID) 50 MCG tablet Take 50 mcg by mouth daily.  5  . phenytoin (DILANTIN) 100 MG ER capsule TAKE 2 CAPSULES (200 MG TOTAL) BY MOUTH 2 (TWO) TIMES DAILY.  11  . polyethylene glycol (MIRALAX / GLYCOLAX) packet 1 POWDER PACK IN 12 OZ WATER DAILY FOR CONSTIPATION    . traMADol (ULTRAM) 50 MG tablet Take 1 tablet (50 mg total) by mouth every 6 (six) hours as needed for moderate pain. (Patient not taking: Reported on 05/30/2017) 12 tablet 0   No current facility-administered medications for this visit.    Facility-Administered Medications Ordered in Other Visits  Medication Dose Route Frequency Provider Last Rate Last Dose  . sodium chloride flush (NS) 0.9 % injection 10 mL  10 mL Intravenous PRN Cammie Sickle, MD   10 mL at 02/26/16 8242    Review of Systems:  GENERAL:  Feels good.  No fevers or sweats.  Weight stable. PERFORMANCE STATUS (ECOG):  2 HEENT:  No visual changes, runny nose, sore throat, mouth sores or tenderness. Lungs: No shortness of breath or cough.  No hemoptysis. Cardiac:  No chest pain, palpitations, orthopnea, or PND. GI:  No nausea, vomiting, diarrhea, constipation, melena or  hematochezia. GU:  No urgency, frequency, dysuria, or hematuria.  Normal menses. Musculoskeletal:  No back pain.  No joint pain. No muscle tenderness. Extremities: Left wrist pain improved with night splints. No pain or swelling. Skin:  No rashes or skin changes. Neuro:  Spina bifida.  No seizure since 2011.  No headache, focal numbness or weakness. Endocrine:  No diabetes.  Thyroid issues on Synthroid.  No hot flashes or night sweats. Psych:  No mood changes, depression or anxiety. Pain:  No focal pain. Review of systems:  All other systems reviewed and found to be negative.  Physical Exam: There were no vitals taken for this visit. GENERAL:   Well developed, well nourished, woman sitting comfortably in the exam room in no acute distress.  She has crutches at her side. MENTAL STATUS:  Alert and oriented to person, place and time. HEAD: Shoulder length dark slightly wavy hair.  Normocephalic, atraumatic, face symmetric, no Cushingoid features. EYES:  Brown/hazel eyes.  Pupils equal round and reactive to light and accomodation.  No conjunctivitis or scleral icterus. ENT:  Oropharynx clear without lesion.  Tongue normal. Mucous membranes moist.  RESPIRATORY:  Clear to auscultation without rales, wheezes or rhonchi. CARDIOVASCULAR:  Regular rate and rhythm without murmur, rub or gallop. CHEST:  Right sided port-a-cath.  Left sided shunt. ABDOMEN:  Soft, non-tender, with active bowel sounds, and no hepatosplenomegaly.  No masses. SKIN:  No rashes, ulcers or lesions. EXTREMITIES:  Leg braces.  Legs cool.  No edema, no skin discoloration or tenderness.  No palpable cords. LYMPH NODES: No palpable cervical, supraclavicular, axillary or inguinal adenopathy  NEUROLOGICAL: Unremarkable. PSYCH:  Appropriate.   No visits with results within 3 Day(s) from this visit.  Latest known visit with results is:  Hospital Outpatient Visit on 06/14/2017  Component Date Value Ref Range Status  . WBC 06/14/2017 5.1  3.6 - 11.0 K/uL Final  . RBC 06/14/2017 3.90  3.80 - 5.20 MIL/uL Final  . Hemoglobin 06/14/2017 13.2  12.0 - 16.0 g/dL Final  . HCT 06/14/2017 38.1  35.0 - 47.0 % Final  . MCV 06/14/2017 97.6  80.0 - 100.0 fL Final  . MCH 06/14/2017 33.7  26.0 - 34.0 pg Final  . MCHC 06/14/2017 34.6  32.0 - 36.0 g/dL Final  . RDW 06/14/2017 12.5  11.5 - 14.5 % Final  . Platelets 06/14/2017 248  150 - 440 K/uL Final   Performed at [facility], [address] [place], [place] [address]  . Sodium 06/14/2017 138  135 - 145 mmol/L Final  . Potassium 06/14/2017 4.0  3.5 - 5.1 mmol/L Final  . Chloride 06/14/2017 103  101 - 111 mmol/L Final  . CO2  06/14/2017 25  22 - 32 mmol/L Final  . Glucose, Bld 06/14/2017 84  65 - 99 mg/dL Final  . BUN 06/14/2017 17  6 - 20 mg/dL Final  . Creatinine, Ser 06/14/2017 0.57  0.44 - 1.00 mg/dL Final  . Calcium 06/14/2017 9.2  8.9 - 10.3 mg/dL Final  . Total Protein 06/14/2017 8.1  6.5 - 8.1 g/dL Final  . Albumin 06/14/2017 4.5  3.5 - 5.0 g/dL Final  . AST 06/14/2017 25  15 - 41 U/L Final  . ALT 06/14/2017 19  14 - 54 U/L Final  . Alkaline Phosphatase 06/14/2017 62  38 - 126 U/L Final  . Total Bilirubin 06/14/2017 0.6  0.3 - 1.2 mg/dL Final  . GFR calc non Af Amer 06/14/2017 >60  >60 mL/min Final  .  GFR calc Af Amer 06/14/2017 >60  >60 mL/min Final   Comment: (NOTE) The eGFR has been calculated using the CKD EPI equation. This calculation has not been validated in all clinical situations. eGFR's persistently <60 mL/min signify possible Chronic Kidney Disease.   Georgiann Hahn gap 06/14/2017 10  5 - 15 Final   Performed at [facility], [address] [place], [place] [address]  . Cholesterol 06/14/2017 145  0 - 200 mg/dL Final  . Triglycerides 06/14/2017 68  <150 mg/dL Final  . HDL 06/14/2017 59  >40 mg/dL Final  . Total CHOL/HDL Ratio 06/14/2017 2.5  RATIO Final  . VLDL 06/14/2017 14  0 - 40 mg/dL Final  . LDL Cholesterol 06/14/2017 72  0 - 99 mg/dL Final   Comment:        Total Cholesterol/HDL:CHD Risk Coronary Heart Disease Risk Table                     Men   Women  1/2 Average Risk   3.4   3.3  Average Risk       5.0   4.4  2 X Average Risk   9.6   7.1  3 X Average Risk  23.4   11.0        Use the calculated Patient Ratio above and the CHD Risk Table to determine the patient's CHD Risk.        ATP III CLASSIFICATION (LDL):  <100     mg/dL   Optimal  100-129  mg/dL   Near or Above                    Optimal  130-159  mg/dL   Borderline  160-189  mg/dL   High  >190     mg/dL   Very High Performed at [facility], [address], [place], [place] [address]   . TSH  06/14/2017 0.360  0.350 - 4.500 uIU/mL Final   Comment: Performed by a 3rd Generation assay with a functional sensitivity of <=0.01 uIU/mL. Performed at [facility], [address] [place], [place] [address]   . Free T4 06/14/2017 0.94  0.61 - 1.12 ng/dL Final   Comment: (NOTE) Biotin ingestion may interfere with free T4 tests. If the results are inconsistent with the TSH level, previous test results, or the clinical presentation, then consider biotin interference. If needed, order repeat testing after stopping biotin. Performed at [facility], [address], [place], [place] [address]   . Vit D, 25-Hydroxy 06/14/2017 48.4  30.0 - 100.0 ng/mL Final   Comment: (NOTE) Vitamin D deficiency has been defined by the [place] practice guideline as a level of serum 25-OH vitamin D less than 20 ng/mL (1,2). The Endocrine Society went on to further define vitamin D insufficiency as a level between 21 and 29 ng/mL (2). 1. IOM (Institute of Medicine). 2010. Dietary reference   intakes for calcium and D. [place]: The   [employer]. 2. Holick MF, Binkley Loraine, Bischoff-Ferrari HA, et al.   Evaluation, treatment, and prevention of vitamin D   deficiency: an Endocrine Society clinical practice   guideline. JCEM. 2011 Jul; 96(7):1911-30. Performed At: [facility] [place], [place] [id number] Rush Farmer MD FY:[id number] Performed at [facility], [address] [place], [place] [address]   . Phenytoin Lvl 06/14/2017 22.5* 10.0 - 20.0 ug/mL Final   Performed  at [facility], [address] [place], [place] [address]    Assessment:  Christina Stephens is a 42 y.o. female with hereditary hemochromatosis diagnosed in 11/2009.  She presented with elevated serum ferritin level of 3157.  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    She began a phlebotomy  program in 11/2009.  She was also started on Exjade (deferasirox).  She was inititally on 20 mg/kg (1250 mg once daily), then dose decreased to 750 mg daily.  Exjade was held in 11/2012, then resumed at a low dose 250 mg daily in 04/2013.  Ferritin has remained low:  13 on 10/20/2014, 22 on 11/28/2014, 16 on 01/02/2015, 23 on 03/27/2015, 32 on 06/19/2015, 30 on 12/18/2015, 34 on 04/08/2016, 34 on 06/20/2016, and 25 on 12/19/2016.  AFP has remained normal:  1.9 on 05/07/2010, 2.9 on 11/19/2010, 2.5 on 06/17/2011, 2.8 on 03/16/2012, 2.2 on 04/12/2013, 3.2 on 12/20/2013, 2.3 on 01/02/2015, 2.9 on 12/18/2015, 3.2 on 06/20/2016, and 2.7 on 12/19/2016.  Labs on 06/19/2015 revealed a hematocrit of 36.2, hemoglobin 12.7, and MCV 95.9.  Liver function tests were normal.  Ferritin was 32.  Iron studies revealed an iron saturation of 60%, TIBC 228, and iron 138.  Labs today revealed hematocrit 33.4, hemoglobin 11.8 and MCV 96.5. Liver function tests are normal. Ferritin is 34. Will re-check CBC at next post flush due to a slight decrease in hematocrit from August 2017. Patient states that she just has her menstrual cycle last week.   RUQ ultrasound on 06/14/2016 revealed cholelithiasis.  There was no sonographic evidence of acute cholecystitis.  There was no focal hepatic abnormality.  She has spina bifida and is s/p shunt.  She has a port-a-cath.  Symptomatically, she denies any complaint.  Hematocrit is 38.1 with a hemoglobin of 13.2.  Ferritin is pending.  Plan: 1.  Review labs from 06/14/2017.  Discuss normal CBC and CMP. 2.  Labs today:  ferritin, AFP. 3.  Review RUQ ultrasound- no liver lesions.  Cholelithiasis. 4.  Continue Exjade.  Goal ferritin < 50.  Await ferritin today.   5.  Continue AFP checks and liver ultrasound every 6 months. 6.  Schedule limited abdominal ultrasound 12/12/2017. 7.  Port flush every 6-8 weeks.  8.  RTC in 6 months for MD assessment, labs (CBC with diff, CMP, ferritin,  AFP), and review of ultrasound.   Lequita Asal, MD 06/19/2017, 4:33 AM    I saw and evaluated the patient, participating in the key portions of the service and reviewing pertinent diagnostic studies and records.  I reviewed the nurse practitioner's note and agree with the findings and the plan.  The assessment and plan were discussed with the patient.  Additional diagnostic studies of *** are needed to clarify *** and would change the clinical management.  A few ***multiple questions were asked by the patient and answered.   Nolon Stalls, MD 06/19/2017,4:33 AM

## 2017-06-19 NOTE — Telephone Encounter (Addendum)
Called patient to inform her that her ferritin is good.  Continue Exjade.

## 2017-06-19 NOTE — Progress Notes (Addendum)
Symptom Management Consult note [facility]  [phone] [phone]  Patient Care Team: [name], MD as PCP - General (Internal Medicine)   Name of the patient: Christina Stephens  [id number]  1976/01/16   Date of visit: 06/19/17  Diagnosis-hereditary hemochromatosis  Chief complaint/ Reason for visit- Fall in waiting room  Heme/Onc history: Christina Stephens is a 42 y.o. female with hereditary hemochromatosis diagnosed in 11/2009.  She presented with elevated serum ferritin level of 3157.  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    She began a phlebotomy program in 11/2009.  She was also started on Exjade (deferasirox).  She was inititally on 20 mg/kg (1250 mg once daily), then dose decreased to 750 mg daily.  Exjade was held in 11/2012, then resumed at a low dose 250 mg daily in 04/2013.  Ferritin has remained low:  13 on 10/20/2014, 22 on 11/28/2014, 16 on 01/02/2015, 23 on 03/27/2015, 32 on 06/19/2015, 30 on 12/18/2015, 34 on 04/08/2016, and 34 on 06/20/2016.  AFP has remained normal:  1.9 on 05/07/2010, 2.9 on 11/19/2010, 2.5 on 06/17/2011, 2.8 on 03/16/2012, 2.2 on 04/12/2013, 3.2 on 12/20/2013, 2.3 on 01/02/2015, 2.9 on 12/18/2015, and 3.2 on 06/20/2016.  Labs on 06/19/2015 revealed a hematocrit of 36.2, hemoglobin 12.7, and MCV 95.9.  Liver function tests were normal.  Ferritin was 32.  Iron studies revealed an iron saturation of 60%, TIBC 228, and iron 138.  Labs today revealed hematocrit 33.4, hemoglobin 11.8 and MCV 96.5. Liver function tests are normal. Ferritin is 34. Will re-check CBC at next post flush due to a slight decrease in hematocrit from August 2017. Patient states that she just has her menstrual cycle last week.   RUQ ultrasound on 08/01/2016 revealed multiple gallstones and no focal hepatic abnormality.   She has spina bifida and is s/p shunt.  She has a port-a-cath.  Interval  history-Patient was last seen by Dr. Mike Gip on 12/19/2016 where she offered no new complaints.  She continued Exjade and did not require phlebotomy at that visit.  Her goal ferritin is less than 50.  She was to continue AFP checks and liver ultrasound every 6 months.  She was to continue port flushes every 6-8 weeks.  Patient presented to clinic today for follow-up of hereditary hemochromatosis.  Unfortunately patient fell while trying to reschedule her appointment today.  Patient states she was trying to move from her crutches to a chair and lost her balance and fell hitting her chin on the appointment desk.  She denies losing consciousness or any seizure activity.  States she ate breakfast this morning and feels pretty good.  States this happens often at home but she tries not to fall if possible.  She denies any pain or injury to her face.  She endorses biting the inside of her mouth with a small amount of blood that she can taste.  She denies any weakness or lethargy.  She has a history of spina bifida with chronic weakness in both legs right greater than left.  She had labs drawn at her PCPs office last week.  ECOG FS:0 - Asymptomatic  Review of systems- Review of Systems  Constitutional: Negative for chills, fever, malaise/fatigue and weight loss.  HENT:       Small scrape at roof of mouth and left cheek  Eyes: Negative.   Respiratory: Negative.   Cardiovascular: Negative.   Gastrointestinal: Negative.   Genitourinary: Negative.   Musculoskeletal: Positive  for falls (Fall today) and joint pain (chin pain).  Skin: Negative.   Neurological: Positive for weakness (Weakness and legs right greater than left and left arm). Negative for dizziness, tingling, tremors, sensory change, seizures and headaches.  Endo/Heme/Allergies: Negative.   Psychiatric/Behavioral: Negative.     Current treatment- periodic Phlebotomy for hereditary hemochromatosis  Allergies  Allergen Reactions  . Latex     Pt  has Spina bifada and was told that if she has surgery she should avoid latex for poss. Reaction/infection.     Past Medical History:  Diagnosis Date  . CPAP (continuous positive airway pressure) dependence   . Hereditary hemochromatosis ([place])   . Hypertension   . Seizures ([place])   . Sleep apnea   . Spina bifida ([place])   . Thyroid disease    hypothyroidism     Past Surgical History:  Procedure Laterality Date  . VP STUNT HEAD SURGERYOther]  2000    Social History   Socioeconomic History  . Marital status: Single    Spouse name: Not on file  . Number of children: Not on file  . Years of education: Not on file  . Highest education level: Not on file  Social Needs  . Financial resource strain: Not on file  . Food insecurity - worry: Not on file  . Food insecurity - inability: Not on file  . Transportation needs - medical: Not on file  . Transportation needs - non-medical: Not on file  Occupational History  . Not on file  Tobacco Use  . Smoking status: Never Smoker  . Smokeless tobacco: Never Used  Substance and Sexual Activity  . Alcohol use: No  . Drug use: No  . Sexual activity: Not on file  Other Topics Concern  . Not on file  Social History Narrative  . Not on file    Family History  Problem Relation Age of Onset  . Ovarian cancer Maternal Aunt   . Hypertension Father   . CAD Father   . Arthritis Sister   . CAD Paternal Grandfather      Current Outpatient Medications:  .  bisoprolol (ZEBETA) 5 MG tablet, Take 5 mg by mouth daily., Disp: , Rfl: 4 .  CALCIUM-VITAMIN D PO, Take 1 tablet by mouth daily., Disp: , Rfl:  .  deferasirox (EXJADE) 250 MG disintegrating tablet, Take 1 tablet (250 mg total) by mouth daily before breakfast., Disp: 30 tablet, Rfl: 11 .  levothyroxine (SYNTHROID, LEVOTHROID) 50 MCG tablet, Take 50 mcg by mouth daily., Disp: , Rfl: 5 .  phenytoin (DILANTIN) 100 MG ER capsule, TAKE 2 CAPSULES (200 MG TOTAL) BY MOUTH 2 (TWO) TIMES  DAILY., Disp: , Rfl: 11 .  polyethylene glycol (MIRALAX / GLYCOLAX) packet, 1 POWDER PACK IN 12 OZ WATER DAILY FOR CONSTIPATION, Disp: , Rfl:  .  traMADol (ULTRAM) 50 MG tablet, Take 1 tablet (50 mg total) by mouth every 6 (six) hours as needed for moderate pain. (Patient not taking: Reported on 06/19/2017), Disp: 12 tablet, Rfl: 0 No current facility-administered medications for this visit.   Facility-Administered Medications Ordered in Other Visits:  .  sodium chloride flush (NS) 0.9 % injection 10 mL, 10 mL, Intravenous, PRN, Cammie Sickle, MD, 10 mL at 02/26/16 8250  Physical exam:  Vitals:   06/19/17 1200  BP: 131/76  Pulse: 64  Resp: 18  Temp: 97.6 F (36.4 C)  TempSrc: Tympanic   Physical Exam  Constitutional: She is oriented to person, place, and time  and well-developed, well-nourished, and in no distress.  HENT:  Head: Normocephalic and atraumatic.  Mouth/Throat: Lacerations present.    Eyes: Pupils are equal, round, and reactive to light.  Neck: Normal range of motion. Neck supple.  Cardiovascular: Normal rate.  Pulmonary/Chest: Effort normal and breath sounds normal.  Abdominal: Soft. Bowel sounds are normal.  Musculoskeletal: Normal range of motion.  Neurological: She is alert and oriented to person, place, and time.  Skin: Skin is warm and dry.  Psychiatric: Mood, memory, affect and judgment normal.     CMP Latest Ref Rng & Units 06/14/2017  Glucose 65 - 99 mg/dL 84  BUN 6 - 20 mg/dL 17  Creatinine 0.44 - 1.00 mg/dL 0.57  Sodium 135 - 145 mmol/L 138  Potassium 3.5 - 5.1 mmol/L 4.0  Chloride 101 - 111 mmol/L 103  CO2 22 - 32 mmol/L 25  Calcium 8.9 - 10.3 mg/dL 9.2  Total Protein 6.5 - 8.1 g/dL 8.1  Total Bilirubin 0.3 - 1.2 mg/dL 0.6  Alkaline Phos 38 - 126 U/L 62  AST 15 - 41 U/L 25  ALT 14 - 54 U/L 19   CBC Latest Ref Rng & Units 06/14/2017  WBC 3.6 - 11.0 K/uL 5.1  Hemoglobin 12.0 - 16.0 g/dL 13.2  Hematocrit 35.0 - 47.0 % 38.1  Platelets 150 -  440 K/uL 248    No images are attached to the encounter.  US Abdomen Limited Ruq  Result Date: 06/14/2017 CLINICAL DATA:  Hemochromatosis. EXAM: ULTRASOUND ABDOMEN LIMITED RIGHT UPPER QUADRANT COMPARISON:  08/01/2016 FINDINGS: Gallbladder: Multiple mobile gallstones, the largest 8 mm. No wall thickening or sonographic Murphy's sign. Common bile duct: Diameter: Normal caliber, 2 mm Liver: No focal lesion identified. Within normal limits in parenchymal echogenicity. Portal vein is patent on color Doppler imaging with normal direction of blood flow towards the liver. IMPRESSION: Cholelithiasis.  No sonographic evidence of acute cholecystitis. No focal hepatic abnormality. Electronically Signed   By: Rolm Baptise M.D.   On: 06/14/2017 12:57     Assessment and plan- Patient is a 42 y.o. female who presents for her appointment with Dr. Mike Gip but incidentally fell while trying to reschedule today.  On initial examination patient is alert and oriented x4.  She does not appear to be disoriented or confused.  Patient has a history of spina bifida and uses crutches while ambulating.  Patient states she got tangled up and fell forward hitting her chin.  Facial bones appear to be without fracture during palpation.  No deformities noted.  Small laceration to roof of mouth and to cheek.  Chin is erythematous.  Vital signs are stable with a blood pressure 131/76 heart rate 64.  She is afebrile.  1.  Stat x-ray of facial bones.  Given patient hit her head pretty hard on the desk will rule out any  fractures.  Otherwise neurologically she is intact.  Patient is very reluctant to have the scan done.  Spoke to patient  at length about potential side effects from her fall including cerebral edema and bleed.  We spoke at length about worsening headache or change in in cognition.  Told patient to go to the emergency room should any of these symptoms occur.  Patient is understanding of plan. Addendum: X-ray of facial was  negative for abnormalities. An incidental finding reveals  a ventricular shunt with unused tubing in the right neck and presumably actively use tubing in the left neck. Correlation clinically as to the adequacy of the  function of the shunt is needed. Will send results to Dr. Claris Gladden with Neurology at [facility] to see what he recommends.  2.  Will have patient get add on labs requested by Dr. Mike Gip. (Ferritin and AFP).  3.  CBC and met C from PCP office.  Reviewed.  Hemoglobin 13.2, hematocrit 38.1, platelets 248. 4.  Return to clinic in 6 months with labs and to see Dr. Mike Gip.  Dr. Mike Gip saw patient today.  She was okay with this plan.   Visit Diagnosis 1. Hereditary hemochromatosis ([place])   2. Accident due to mechanical fall without injury, initial encounter     Patient expressed understanding and was in agreement with this plan. She also understands that She can call clinic at any time with any questions, concerns, or complaints.   Greater than 50% was spent in counseling and coordination of care with this patient including but not limited to discussion of the relevant topics above (See A&P) including, but not limited to diagnosis and management of acute and chronic medical conditions.    Faythe Casa, AGNP-C [facility]- [phone] Pager- [phone] 06/19/2017 12:59 PM

## 2017-06-20 ENCOUNTER — Other Ambulatory Visit: Payer: Self-pay | Admitting: Urgent Care

## 2017-06-20 ENCOUNTER — Encounter: Payer: Self-pay | Admitting: Oncology

## 2017-06-20 ENCOUNTER — Other Ambulatory Visit: Payer: Self-pay

## 2017-06-20 LAB — AFP TUMOR MARKER: AFP, Serum, Tumor Marker: 2.8 ng/mL (ref 0.0–8.3)

## 2017-06-20 MED ORDER — BISOPROLOL FUMARATE 5 MG PO TABS: 5.0000 mg | ORAL_TABLET | Freq: Every day | ORAL | 4 refills | Status: DC

## 2017-06-21 ENCOUNTER — Telehealth: Payer: Self-pay

## 2017-06-21 MED ORDER — METOPROLOL TARTRATE 25 MG PO TABS: 25.0000 mg | ORAL_TABLET | Freq: Two times a day (BID) | ORAL | 2 refills | Status: DC

## 2017-06-21 NOTE — Telephone Encounter (Signed)
Phar faxed that bisoprolol is backorder as per heather esend metoprolol 53m twice daily and pt aware

## 2017-06-30 ENCOUNTER — Ambulatory Visit
Admission: RE | Admit: 2017-06-30 | Discharge: 2017-06-30 | Disposition: A | Discharge status: Home / Self Care | Source: Ambulatory Visit | Attending: Nurse Practitioner | Admitting: Nurse Practitioner

## 2017-06-30 DIAGNOSIS — Z1231 Encounter for screening mammogram for malignant neoplasm of breast: Secondary | ICD-10-CM | POA: Insufficient documentation

## 2017-06-30 DIAGNOSIS — Z1239 Encounter for other screening for malignant neoplasm of breast: Secondary | ICD-10-CM

## 2017-07-03 ENCOUNTER — Inpatient Hospital Stay

## 2017-08-21 ENCOUNTER — Inpatient Hospital Stay: Attending: Hematology and Oncology

## 2017-08-21 DIAGNOSIS — Z95828 Presence of other vascular implants and grafts: Secondary | ICD-10-CM

## 2017-08-21 MED ORDER — SODIUM CHLORIDE 0.9% FLUSH
10.0000 mL | INTRAVENOUS | Status: AC | PRN
  Administered 2017-08-21: 10 mL
  Filled 2017-08-21: qty 10

## 2017-08-21 MED ORDER — HEPARIN SOD (PORK) LOCK FLUSH 100 UNIT/ML IV SOLN
500.0000 [IU] | INTRAVENOUS | Status: AC | PRN
  Administered 2017-08-21: 500 [IU]

## 2017-08-24 ENCOUNTER — Encounter: Payer: Self-pay | Admitting: Internal Medicine

## 2017-08-24 ENCOUNTER — Ambulatory Visit (INDEPENDENT_AMBULATORY_CARE_PROVIDER_SITE_OTHER): Payer: PRIVATE HEALTH INSURANCE | Admitting: Internal Medicine

## 2017-08-24 VITALS — BP 116/72 | HR 64 | Resp 16 | Ht <= 58 in | Wt 120.0 lb

## 2017-08-24 DIAGNOSIS — G4733 Obstructive sleep apnea (adult) (pediatric): Secondary | ICD-10-CM | POA: Diagnosis not present

## 2017-08-24 DIAGNOSIS — Z9989 Dependence on other enabling machines and devices: Secondary | ICD-10-CM | POA: Diagnosis not present

## 2017-08-24 DIAGNOSIS — Q056 Thoracic spina bifida without hydrocephalus: Secondary | ICD-10-CM

## 2017-08-24 NOTE — Progress Notes (Signed)
[facility] [place], [place] [address]  Pulmonary Sleep Medicine   Office Visit Note  Patient Name: KORINA TRETTER DOB: May 22, 1975 MRN [id number]  Date of Service: 08/24/2017  Complaints/HPI:  She is doing well has been compliant with her CPAP.  She states basically that she cannot sleep without her stay machine at this time.  She has had no other major issues.  She did have some concerns about the billing from her DME provider.  I referred her to our staff to see if they can help her out.  If she is not able to get the help that she needs from them then she will probably want to switch her DME provider  ROS  General: (-) fever, (-) chills, (-) night sweats, (-) weakness Skin: (-) rashes, (-) itching,. Eyes: (-) visual changes, (-) redness, (-) itching. Nose and Sinuses: (-) nasal stuffiness or itchiness, (-) postnasal drip, (-) nosebleeds, (-) sinus trouble. Mouth and Throat: (-) sore throat, (-) hoarseness. Neck: (-) swollen glands, (-) enlarged thyroid, (-) neck pain. Respiratory: - cough, (-) bloody sputum, - shortness of breath, - wheezing. Cardiovascular: - ankle swelling, (-) chest pain. Lymphatic: (-) lymph node enlargement. Neurologic: (-) numbness, (-) tingling. Psychiatric: (-) anxiety, (-) depression   Current Medication: Outpatient Encounter Medications as of 08/24/2017  Medication Sig Note  . CALCIUM-VITAMIN D PO Take 1 tablet by mouth daily.   Marland Kitchen deferasirox (EXJADE) 250 MG disintegrating tablet Take 1 tablet (250 mg total) by mouth daily before breakfast.   . levothyroxine (SYNTHROID, LEVOTHROID) 50 MCG tablet Take 50 mcg by mouth daily. 01/02/2015: Received from: External Pharmacy  . metoprolol tartrate (LOPRESSOR) 25 MG tablet Take 1 tablet (25 mg total) by mouth 2 (two) times daily.   . phenytoin (DILANTIN) 100 MG ER capsule TAKE 2 CAPSULES (200 MG TOTAL) BY MOUTH 2 (TWO) TIMES DAILY. 01/02/2015: Received from: External Pharmacy  .  polyethylene glycol (MIRALAX / GLYCOLAX) packet 1 POWDER PACK IN 12 OZ WATER DAILY FOR CONSTIPATION   . traMADol (ULTRAM) 50 MG tablet Take 1 tablet (50 mg total) by mouth every 6 (six) hours as needed for moderate pain. (Patient not taking: Reported on 06/19/2017)    Facility-Administered Encounter Medications as of 08/24/2017  Medication  . sodium chloride flush (NS) 0.9 % injection 10 mL    Surgical History: Past Surgical History:  Procedure Laterality Date  . REDUCTION MAMMAPLASTY Bilateral 1993  . VP STUNT HEAD SURGERYOther]  2000    Medical History: Past Medical History:  Diagnosis Date  . CPAP (continuous positive airway pressure) dependence   . Hereditary hemochromatosis ([place])   . Hypertension   . Seizures ([place])   . Sleep apnea   . Spina bifida ([place])   . Thyroid disease    hypothyroidism    Family History: Family History  Problem Relation Age of Onset  . Ovarian cancer Maternal Aunt   . Hypertension Father   . CAD Father   . Arthritis Sister   . CAD Paternal Grandfather   . Breast cancer Maternal Grandmother     Social History: Social History   Socioeconomic History  . Marital status: Single    Spouse name: Not on file  . Number of children: Not on file  . Years of education: Not on file  . Highest education level: Not on file  Occupational History  . Not on file  Social Needs  . Financial resource strain: Not on file  . Food insecurity:  Worry: Not on file    Inability: Not on file  . Transportation needs:    Medical: Not on file    Non-medical: Not on file  Tobacco Use  . Smoking status: Never Smoker  . Smokeless tobacco: Never Used  Substance and Sexual Activity  . Alcohol use: No  . Drug use: No  . Sexual activity: Not on file  Lifestyle  . Physical activity:    Days per week: Not on file    Minutes per session: Not on file  . Stress: Not on file  Relationships  . Social connections:    Talks on phone: Not on file    Gets together:  Not on file    Attends religious service: Not on file    Active member of club or organization: Not on file    Attends meetings of clubs or organizations: Not on file    Relationship status: Not on file  . Intimate partner violence:    Fear of current or ex partner: Not on file    Emotionally abused: Not on file    Physically abused: Not on file    Forced sexual activity: Not on file  Other Topics Concern  . Not on file  Social History Narrative  . Not on file    Vital Signs: Blood pressure 116/72, pulse 64, resp. rate 16, height _0  (1.397 m), weight 120 lb (54.4 kg), SpO2 99 %.  Examination: General Appearance: The patient is well-developed, well-nourished, and in no distress. Skin: Gross inspection of skin unremarkable. Head: normocephalic, no gross deformities. Eyes: no gross deformities noted. ENT: ears appear grossly normal no exudates. Neck: Supple. No thyromegaly. No LAD. Respiratory: no rhonchi . Cardiovascular: Normal S1 and S2 without murmur or rub. Extremities: No cyanosis. pulses are equal. Neurologic: Alert and oriented. No involuntary movements.  LABS: Recent Results (from the past 2160 hour(s))  CBC     Status: None   Collection Time: 06/14/17 10:20 AM  Result Value Ref Range   WBC 5.1 3.6 - 11.0 K/uL   RBC 3.90 3.80 - 5.20 MIL/uL   Hemoglobin 13.2 12.0 - 16.0 g/dL   HCT 38.1 35.0 - 47.0 %   MCV 97.6 80.0 - 100.0 fL   MCH 33.7 26.0 - 34.0 pg   MCHC 34.6 32.0 - 36.0 g/dL   RDW 12.5 11.5 - 14.5 %   Platelets 248 150 - 440 K/uL    Comment: Performed at [facility], [address] [place], [place] [address]  Comprehensive metabolic panel     Status: None   Collection Time: 06/14/17 10:20 AM  Result Value Ref Range   Sodium 138 135 - 145 mmol/L   Potassium 4.0 3.5 - 5.1 mmol/L   Chloride 103 101 - 111 mmol/L   CO2 25 22 - 32 mmol/L   Glucose, Bld 84 65 - 99 mg/dL   BUN 17 6 - 20 mg/dL   Creatinine, Ser 0.57 0.44 - 1.00 mg/dL   Calcium  9.2 8.9 - 10.3 mg/dL   Total Protein 8.1 6.5 - 8.1 g/dL   Albumin 4.5 3.5 - 5.0 g/dL   AST 25 15 - 41 U/L   ALT 19 14 - 54 U/L   Alkaline Phosphatase 62 38 - 126 U/L   Total Bilirubin 0.6 0.3 - 1.2 mg/dL   GFR calc non Af Amer >60 >60 mL/min   GFR calc Af Amer >60 >60 mL/min    Comment: (NOTE) The eGFR has been  calculated using the CKD EPI equation. This calculation has not been validated in all clinical situations. eGFR's persistently <60 mL/min signify possible Chronic Kidney Disease.    Anion gap 10 5 - 15    Comment: Performed at [facility], [address] [place], [place] [address]  Lipid panel     Status: None   Collection Time: 06/14/17 10:20 AM  Result Value Ref Range   Cholesterol 145 0 - 200 mg/dL   Triglycerides 68 <150 mg/dL   HDL 59 >40 mg/dL   Total CHOL/HDL Ratio 2.5 RATIO   VLDL 14 0 - 40 mg/dL   LDL Cholesterol 72 0 - 99 mg/dL    Comment:        Total Cholesterol/HDL:CHD Risk Coronary Heart Disease Risk Table                     Men   Women  1/2 Average Risk   3.4   3.3  Average Risk       5.0   4.4  2 X Average Risk   9.6   7.1  3 X Average Risk  23.4   11.0        Use the calculated Patient Ratio above and the CHD Risk Table to determine the patient's CHD Risk.        ATP III CLASSIFICATION (LDL):  <100     mg/dL   Optimal  100-129  mg/dL   Near or Above                    Optimal  130-159  mg/dL   Borderline  160-189  mg/dL   High  >190     mg/dL   Very High Performed at [facility], [address] [place], [place] [address]   TSH     Status: None   Collection Time: 06/14/17 10:20 AM  Result Value Ref Range   TSH 0.360 0.350 - 4.500 uIU/mL    Comment: Performed by a 3rd Generation assay with a functional sensitivity of <=0.01 uIU/mL. Performed at [facility], [address] [place], [place] [address]   T4, free     Status: None   Collection Time: 06/14/17 10:20 AM  Result Value Ref Range   Free T4 0.94  0.61 - 1.12 ng/dL    Comment: (NOTE) Biotin ingestion may interfere with free T4 tests. If the results are inconsistent with the TSH level, previous test results, or the clinical presentation, then consider biotin interference. If needed, order repeat testing after stopping biotin. Performed at [facility], [address] [place], [place] [address]   VITAMIN D 25 Hydroxy (Vit-D Deficiency, Fractures)     Status: None   Collection Time: 06/14/17 10:20 AM  Result Value Ref Range   Vit D, 25-Hydroxy 48.4 30.0 - 100.0 ng/mL    Comment: (NOTE) Vitamin D deficiency has been defined by the [place] practice guideline as a level of serum 25-OH vitamin D less than 20 ng/mL (1,2). The Endocrine Society went on to further define vitamin D insufficiency as a level between 21 and 29 ng/mL (2). 1. IOM (Institute of Medicine). 2010. Dietary reference   intakes for calcium and D. [place]: The   [employer]. 2. Holick MF, Binkley [employer], Bischoff-Ferrari HA, et al.   Evaluation, treatment, and prevention of vitamin D   deficiency: an Endocrine Society clinical practice   guideline.  JCEM. 2011 Jul; 96(7):1911-30. Performed At: [facility] [place], [place] [id number] Rush Farmer MD SW:[id number] Performed at [facility], [address] [place], [place] [address]   Phenytoin level, total     Status: Abnormal   Collection Time: 06/14/17 10:20 AM  Result Value Ref Range   Phenytoin Lvl 22.5 (H) 10.0 - 20.0 ug/mL    Comment: Performed at [facility], [address] [place], [place] [address]  AFP tumor marker     Status: None   Collection Time: 06/19/17 12:31 PM  Result Value Ref Range   AFP, Serum, Tumor Marker 2.8 0.0 - 8.3 ng/mL    Comment: (NOTE) Roche Diagnostics Electrochemiluminescence Immunoassay (ECLIA) Values obtained with different assay methods or kits cannot be used  interchangeably.  Results cannot be interpreted as absolute evidence of the presence or absence of malignant disease. This test is not interpretable in pregnant females. Performed At: [facility] [place], [place] [id number] Rush Farmer MD [id number] Performed at [facility], [address] [place], [place] [address]   Ferritin     Status: None   Collection Time: 06/19/17 12:31 PM  Result Value Ref Range   Ferritin 37 11 - 307 ng/mL    Comment: Performed at [facility], [address] [place], [place] [address]    Radiology: Mm Digital Screening  Result Date: 06/30/2017 CLINICAL DATA:  Screening. EXAM: DIGITAL SCREENING BILATERAL MAMMOGRAM WITH CAD COMPARISON:  None. ACR Breast Density Category b: There are scattered areas of fibroglandular density. FINDINGS: There are no findings suspicious for malignancy. Images were processed with CAD. IMPRESSION: No mammographic evidence of malignancy. A result letter of this screening mammogram will be mailed directly to the patient. RECOMMENDATION: Screening mammogram in one year. (Code:SM-B-01Y) BI-RADS CATEGORY  1: Negative. Electronically Signed   By: Curlene Dolphin M.D.   On: 06/30/2017 11:06    No results found.  No results found.    Assessment and Plan: Patient Active Problem List   Diagnosis Date Noted  . Hypertension 05/31/2017  . Sleep apnea 05/31/2017  . Spina bifida ([place]) 05/31/2017  . Hemochromatosis 11/28/2014    1. OSA doing well we will continue with CPAP at the current pressures.  Continue to check her down mode she has been compliant 2. Spina Bifeda stable at this time we will continue present management 3. Seizure Disorder no active seizures  General Counseling: I have discussed the findings of the evaluation and examination with Sheppard Evens.  I have also discussed any further diagnostic evaluation thatmay be needed or ordered today. Leidy verbalizes understanding of the  findings of todays visit. We also reviewed her medications today and discussed drug interactions and side effects including but not limited excessive drowsiness and altered mental states. We also discussed that there is always a risk not just to her but also people around her. she has been encouraged to call the office with any questions or concerns that should arise related to todays visit.    Time spent: 6mn  I have personally obtained a history, examined the patient, evaluated laboratory and imaging results, formulated the assessment and plan and placed orders.    SAllyne Gee MD [facility]

## 2017-08-24 NOTE — Patient Instructions (Signed)
Sleep Apnea Sleep apnea is a condition that affects breathing. People with sleep apnea have moments during sleep when their breathing pauses briefly or gets shallow. Sleep apnea can cause these symptoms:  Trouble staying asleep.  Sleepiness or tiredness during the day.  Irritability.  Loud snoring.  Morning headaches.  Trouble concentrating.  Forgetting things.  Less interest in sex.  Being sleepy for no reason.  Mood swings.  Personality changes.  Depression.  Waking up a lot during the night to pee (urinate).  Dry mouth.  Sore throat.  Follow these instructions at home:  Make any changes in your routine that your doctor recommends.  Eat a healthy, well-balanced diet.  Take over-the-counter and prescription medicines only as told by your doctor.  Avoid using alcohol, calming medicines (sedatives), and narcotic medicines.  Take steps to lose weight if you are overweight.  If you were given a machine (device) to use while you sleep, use it only as told by your doctor.  Do not use any tobacco products, such as cigarettes, chewing tobacco, and e-cigarettes. If you need help quitting, ask your doctor.  Keep all follow-up visits as told by your doctor. This is important. Contact a doctor if:  The machine that you were given to use during sleep is uncomfortable or does not seem to be working.  Your symptoms do not get better.  Your symptoms get worse. Get help right away if:  Your chest hurts.  You have trouble breathing in enough air (shortness of breath).  You have an uncomfortable feeling in your back, arms, or stomach.  You have trouble talking.  One side of your body feels weak.  A part of your face is hanging down (drooping). These symptoms may be an emergency. Do not wait to see if the symptoms will go away. Get medical help right away. Call your local emergency services (911 in the [place] Do not drive yourself to the hospital. This information  is not intended to replace advice given to you by your health care provider. Make sure you discuss any questions you have with your health care provider. Document Released: 02/02/2008 Document Revised: 12/20/2015 Document Reviewed: 02/02/2015 Elsevier Interactive Patient Education  [employer].

## 2017-09-13 ENCOUNTER — Ambulatory Visit (INDEPENDENT_AMBULATORY_CARE_PROVIDER_SITE_OTHER): Payer: PRIVATE HEALTH INSURANCE

## 2017-09-13 DIAGNOSIS — G4733 Obstructive sleep apnea (adult) (pediatric): Secondary | ICD-10-CM | POA: Diagnosis not present

## 2017-09-13 NOTE — Progress Notes (Signed)
95 percentile pressure 8 cwp   95th percentile leak 1 min 4 sec   apnea index  /hr  apnea-hypopnea index  21.6 /hr   total days used  >4 hr 30 days  total days used <4 hr 0 days  Total compliance 100 percent  Pt doing great loves machine. Patient AHI is up to 21, possible increase in pressure or maybe an auto setting 5-15 cwp? Pt stated she had problems with billing gave her a name and number.

## 2017-09-14 ENCOUNTER — Telehealth: Payer: Self-pay | Admitting: *Deleted

## 2017-09-14 NOTE — Telephone Encounter (Signed)
Called [facility] to inform them, per Dr. Mike Gip that she is authorizing generic X Jade (Deferasirox) 250 mg for patient.  Spoke with Hassan Buckler, Scientist, research (medical).

## 2017-09-14 NOTE — Telephone Encounter (Signed)
  Ok to from [facility] to generic.  M

## 2017-09-14 NOTE — Telephone Encounter (Signed)
Call from [facility] to see if they can update prescription from [place] 250 mg to Deferasirox.  Just need authorization to send generic medication.  Please advise.  Thanks!

## 2017-09-18 ENCOUNTER — Other Ambulatory Visit: Payer: Self-pay

## 2017-09-18 MED ORDER — METOPROLOL TARTRATE 25 MG PO TABS: 25.0000 mg | ORAL_TABLET | Freq: Two times a day (BID) | ORAL | 2 refills | Status: DC

## 2017-12-12 ENCOUNTER — Ambulatory Visit
Admission: RE | Admit: 2017-12-12 | Discharge: 2017-12-12 | Disposition: A | Discharge status: Home / Self Care | Source: Ambulatory Visit | Attending: Urgent Care | Admitting: Urgent Care

## 2017-12-12 DIAGNOSIS — K8020 Calculus of gallbladder without cholecystitis without obstruction: Secondary | ICD-10-CM | POA: Diagnosis not present

## 2017-12-18 ENCOUNTER — Other Ambulatory Visit

## 2017-12-18 ENCOUNTER — Ambulatory Visit: Admitting: Hematology and Oncology

## 2017-12-19 ENCOUNTER — Other Ambulatory Visit: Payer: Self-pay | Admitting: Nurse Practitioner

## 2017-12-19 MED ORDER — METOPROLOL TARTRATE 25 MG PO TABS: 25.0000 mg | ORAL_TABLET | Freq: Two times a day (BID) | ORAL | 2 refills | Status: DC

## 2017-12-31 NOTE — Progress Notes (Signed)
[place] [facility] day:  01/01/18  Chief Complaint: Christina Stephens is a 42 y.o. female with hemochromatosis who is seen for 1 year assessment.  HPI:  The patient was last seen in the medical oncology clinic on 12/19/2016.  At that time, patient was doing well overall. She denied acute concerns. No RUQ abdominal pain associated with known cholelithiasis. She denies wrist pain; wearing a splint. Exam was stable. Hemoglobin 11.7 and hematocrit 33.5.  Interval RUQ abdominal ultrasound was done on 06/14/2017 that revealed cholelithiasis with no sonographic evidence of cholecystitis. There were no focal hepatic lesions noted. Blood flow towards the liver was observed.   Patient presented to the clinic on 06/19/2017 for her scheduled routine follow up. Patient elected to re-schedule her appointment and attempted to leave prior to being seen. In review of the notes, it appears as if patient sustained a mechanical fall while in the lobby. She struck her chin on the desk. No LOC. Patient sustained minor oral trauma. She was seen in the [facility] by Faythe Casa, NP. Diagnostic plain films of the facial bones were negative for facial fracture. Labs reviewed from PCP. AFP normal in clinic. Ferritin 37.   Routine mammogram on 06/30/2017 revealed no mammographic evidence of malignancy in either breast.  Interval RUQ abdominal ultrasound on 12/12/2017 once again revealed the known cholelithiasis without sonographic evidence of cholecystitis. No ductal dilatation. Liver appears smooth. Normal blood flow towards the liver observed.   In the interim, patient is doing well. She denies any acute concerns today. She feels generally well. Patient denies that she has experienced any B symptoms. She denies any interval infections. Patient advises that she maintains an adequate appetite. She is eating well. Weight today is 124 lb 7 oz (56.4 kg), which compared to her last visit to the  clinic, represents a  2 pound increase.   Patient denies pain in the clinic today.   Past Medical History:  Diagnosis Date  . CPAP (continuous positive airway pressure) dependence   . Hereditary hemochromatosis ([place])   . Hypertension   . Seizures ([place])   . Sleep apnea   . Spina bifida ([place])   . Thyroid disease    hypothyroidism    Past Surgical History:  Procedure Laterality Date  . REDUCTION MAMMAPLASTY Bilateral 1993  . VP STUNT HEAD SURGERYOther]  2000    Family History  Problem Relation Age of Onset  . Ovarian cancer Maternal Aunt   . Hypertension Father   . CAD Father   . Arthritis Sister   . CAD Paternal Grandfather   . Breast cancer Maternal Grandmother     Social History:  reports that she has never smoked. She has never used smokeless tobacco. She reports that she does not drink alcohol or use drugs.  She lives in [place].  The patient is accompanied by her mother today.  Allergies:  Allergies  Allergen Reactions  . Latex     Pt has Spina bifada and was told that if she has surgery she should avoid latex for poss. Reaction/infection.    Current Medications: Current Outpatient Medications  Medication Sig Dispense Refill  . CALCIUM-VITAMIN D PO Take 1 tablet by mouth daily.    Marland Kitchen deferasirox (EXJADE) 250 MG disintegrating tablet Take 1 tablet (250 mg total) by mouth daily before breakfast. 30 tablet 11  . levothyroxine (SYNTHROID, LEVOTHROID) 50 MCG tablet Take 50 mcg by mouth daily.  5  . metoprolol tartrate (LOPRESSOR) 25  MG tablet Take 1 tablet (25 mg total) by mouth 2 (two) times daily. 60 tablet 2  . phenytoin (DILANTIN) 100 MG ER capsule TAKE 2 CAPSULES (200 MG TOTAL) BY MOUTH 2 (TWO) TIMES DAILY.  11  . polyethylene glycol (MIRALAX / GLYCOLAX) packet 1 POWDER PACK IN 12 OZ WATER DAILY FOR CONSTIPATION    . traMADol (ULTRAM) 50 MG tablet Take 1 tablet (50 mg total) by mouth every 6 (six) hours as needed for moderate pain. (Patient not taking: Reported on  01/01/2018) 12 tablet 0   No current facility-administered medications for this visit.    Facility-Administered Medications Ordered in Other Visits  Medication Dose Route Frequency Provider Last Rate Last Dose  . sodium chloride flush (NS) 0.9 % injection 10 mL  10 mL Intravenous PRN Cammie Sickle, MD   10 mL at 02/26/16 0917  . sodium chloride flush (NS) 0.9 % injection 10 mL  10 mL Intravenous PRN Nolon Stalls C, MD   10 mL at 01/01/18 1108    Review of Systems  Constitutional: Negative for diaphoresis, fever, malaise/fatigue and weight loss (up 2 pounds).       Feels "good".  HENT: Negative.   Eyes: Negative.   Respiratory: Negative for cough, hemoptysis, sputum production and shortness of breath.        PMH (+) for OSAH - uses nocturnal PAP therapy  Cardiovascular: Negative for chest pain, palpitations, orthopnea, leg swelling and PND.  Gastrointestinal: Negative for abdominal pain, blood in stool, constipation, diarrhea, melena, nausea and vomiting.  Genitourinary: Negative for dysuria, frequency, hematuria and urgency.  Musculoskeletal: Negative for back pain, falls, joint pain (wrist pain; wears splints) and myalgias.  Skin: Negative for itching and rash.       Port to RIGHT chest   Neurological: Positive for seizures (last was in 2011). Negative for dizziness, tremors, weakness and headaches.       Spina bifida  Endo/Heme/Allergies: Does not bruise/bleed easily.       HYPOthyroidism on levothyroxine  Psychiatric/Behavioral: Negative for depression, memory loss and suicidal ideas. The patient is not nervous/anxious and does not have insomnia.   All other systems reviewed and are negative.  Performance status (ECOG): 2 - Symptomatic, <50% confined to bed  Vital Signs BP 134/87 (BP Location: Left Arm, Patient Position: Sitting)   Pulse 85   Temp (!) 95.5 F (35.3 C) (Tympanic)   Resp 18   Wt 124 lb 7 oz (56.4 kg)   BMI 28.92 kg/m   Physical Exam   Constitutional: She is oriented to person, place, and time and well-developed, well-nourished, and in no distress.  HENT:  Head: Normocephalic and atraumatic.  Long dark hair  Eyes: Pupils are equal, round, and reactive to light. EOM are normal. No scleral icterus.  Brown eyes  Neck: Normal range of motion. Neck supple. No tracheal deviation present. No thyromegaly present.  Cardiovascular: Normal rate, regular rhythm and normal heart sounds. Exam reveals no gallop and no friction rub.  No murmur heard. Pulmonary/Chest: Effort normal and breath sounds normal. No respiratory distress. She has no wheezes. She has no rales.  Port-a-cath to RIGHT chest wall  Abdominal: Soft. Bowel sounds are normal. She exhibits no distension. There is no tenderness.  Musculoskeletal: Normal range of motion. She exhibits deformity (BLE - legs in braces). She exhibits no edema or tenderness.  Lymphadenopathy:    She has no cervical adenopathy.    She has no axillary adenopathy.  Right: No inguinal and no supraclavicular adenopathy present.       Left: No inguinal and no supraclavicular adenopathy present.  Neurological: She is alert and oriented to person, place, and time.  Skin: Skin is warm and dry. No rash noted. No erythema.  Psychiatric: Mood, affect and judgment normal.  Nursing note and vitals reviewed.   Orders Only on 01/01/2018  Component Date Value Ref Range Status  . Sodium 01/01/2018 135  135 - 145 mmol/L Final  . Potassium 01/01/2018 4.2  3.5 - 5.1 mmol/L Final  . Chloride 01/01/2018 101  98 - 111 mmol/L Final  . CO2 01/01/2018 26  22 - 32 mmol/L Final  . Glucose, Bld 01/01/2018 94  70 - 99 mg/dL Final  . BUN 01/01/2018 16  6 - 20 mg/dL Final  . Creatinine, Ser 01/01/2018 0.56  0.44 - 1.00 mg/dL Final  . Calcium 01/01/2018 8.8* 8.9 - 10.3 mg/dL Final  . Total Protein 01/01/2018 7.4  6.5 - 8.1 g/dL Final  . Albumin 01/01/2018 4.1  3.5 - 5.0 g/dL Final  . AST 01/01/2018 30  15 - 41  U/L Final  . ALT 01/01/2018 32  0 - 44 U/L Final  . Alkaline Phosphatase 01/01/2018 69  38 - 126 U/L Final  . Total Bilirubin 01/01/2018 0.5  0.3 - 1.2 mg/dL Final  . GFR calc non Af Amer 01/01/2018 >60  >60 mL/min Final  . GFR calc Af Amer 01/01/2018 >60  >60 mL/min Final   Comment: (NOTE) The eGFR has been calculated using the CKD EPI equation. This calculation has not been validated in all clinical situations. eGFR's persistently <60 mL/min signify possible Chronic Kidney Disease.   Georgiann Hahn gap 01/01/2018 8  5 - 15 Final   Performed at [facility], [address] [place], [place] [address]  . WBC 01/01/2018 6.3  3.6 - 11.0 K/uL Final  . RBC 01/01/2018 3.64* 3.80 - 5.20 MIL/uL Final  . Hemoglobin 01/01/2018 12.5  12.0 - 16.0 g/dL Final  . HCT 01/01/2018 35.8  35.0 - 47.0 % Final  . MCV 01/01/2018 98.4  80.0 - 100.0 fL Final  . MCH 01/01/2018 34.2* 26.0 - 34.0 pg Final  . MCHC 01/01/2018 34.8  32.0 - 36.0 g/dL Final  . RDW 01/01/2018 12.9  11.5 - 14.5 % Final  . Platelets 01/01/2018 275  150 - 440 K/uL Final  . Neutrophils Relative % 01/01/2018 57  % Final  . Neutro Abs 01/01/2018 3.6  1.4 - 6.5 K/uL Final  . Lymphocytes Relative 01/01/2018 30  % Final  . Lymphs Abs 01/01/2018 1.9  1.0 - 3.6 K/uL Final  . Monocytes Relative 01/01/2018 9  % Final  . Monocytes Absolute 01/01/2018 0.6  0.2 - 0.9 K/uL Final  . Eosinophils Relative 01/01/2018 3  % Final  . Eosinophils Absolute 01/01/2018 0.2  0 - 0.7 K/uL Final  . Basophils Relative 01/01/2018 1  % Final  . Basophils Absolute 01/01/2018 0.0  0 - 0.1 K/uL Final   Performed at [facility], [address] [place], [place] [address]    Assessment:  Christina Stephens is a 42 y.o. female with hereditary hemochromatosis diagnosed in 11/2009.  She presented with elevated serum ferritin level of 3157.  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    She began a phlebotomy program in 11/2009.  She was also  started on Exjade (deferasirox).  She was inititally on 20 mg/kg (1250 mg once daily), then  dose decreased to 750 mg daily.  Exjade was held in 11/2012, then resumed at a low dose 250 mg daily in 04/2013.  Ferritin has remained low:  13 on 10/20/2014, 22 on 11/28/2014, 16 on 01/02/2015, 23 on 03/27/2015, 32 on 06/19/2015, 30 on 12/18/2015, 34 on 04/08/2016, 34 on 06/20/2016, 25 on 12/19/2016, and 37 on 06/19/2017.  AFP has remained normal:  1.9 on 05/07/2010, 2.9 on 11/19/2010, 2.5 on 06/17/2011, 2.8 on 03/16/2012, 2.2 on 04/12/2013, 3.2 on 12/20/2013, 2.3 on 01/02/2015, 2.9 on 12/18/2015,  3.2 on 06/20/2016, 2.7 on 12/19/2016, 2.8 on 06/19/2017, and 2.1 on 01/01/2018.  Labs on 06/19/2015 revealed a hematocrit of 36.2, hemoglobin 12.7, and MCV 95.9.  Liver function tests were normal.  Ferritin was 32.  Iron studies revealed an iron saturation of 60%, TIBC 228, and iron 138.  RUQ ultrasound on 08/01/2016, 06/14/2017, and 12/12/2017 revealed multiple gallstones and no focal hepatic abnormality.   She has spina bifida and is s/p shunt.  She has a port-a-cath.  Mammogram done on 06/30/2017 revealed no mammographic evidence of malignancy in either breast.  Symptomatically, she is doing well.  Exam is stable.  Plan: 1. Labs today: CBC with diff, CMP, ferritin, AFP 2. Hereditary hemachromatosis - stable  Labs reviewed. Hemoglobin 12.5 and hematocrit 35.8. Ferritin 34. Goal ferritin </= 50.  Remains on oral chelation therapy (Exjade) daily.   No need for a small volume therapeutic phlebotomy today.   Continue surveillance of AFP and RUQ ultrasounds every 6 months due to increased risk of developing hepatocellular carcinoma.  Schedule RUQ ultrasound on 06/14/2018. 3. Port maintainence - continue flushes every 6-8 weeks.  4. RTC in 6 months for MD assessment, labs (CBC with diff, CMP, ferritin, AFP) and review of ultrasound.   Honor Loh, NP  01/01/2018, 11:51 AM    I saw and evaluated the  patient, participating in the key portions of the service and reviewing pertinent diagnostic studies and records.  I reviewed the nurse practitioner's note and agree with the findings and the plan.  The assessment and plan were discussed with the patient.  A few questions were asked by the patient and answered.   Nolon Stalls, MD 01/01/2018,11:51 AM

## 2018-01-01 ENCOUNTER — Inpatient Hospital Stay

## 2018-01-01 ENCOUNTER — Other Ambulatory Visit: Payer: Self-pay

## 2018-01-01 ENCOUNTER — Inpatient Hospital Stay: Attending: Hematology and Oncology | Admitting: Hematology and Oncology

## 2018-01-01 ENCOUNTER — Encounter: Payer: Self-pay | Admitting: Hematology and Oncology

## 2018-01-01 DIAGNOSIS — Z95828 Presence of other vascular implants and grafts: Secondary | ICD-10-CM

## 2018-01-01 LAB — COMPREHENSIVE METABOLIC PANEL
ALT: 32 U/L (ref 0–44)
AST: 30 U/L (ref 15–41)
Albumin: 4.1 g/dL (ref 3.5–5.0)
Alkaline Phosphatase: 69 U/L (ref 38–126)
Anion gap: 8 (ref 5–15)
BUN: 16 mg/dL (ref 6–20)
CO2: 26 mmol/L (ref 22–32)
Calcium: 8.8 mg/dL — ABNORMAL LOW (ref 8.9–10.3)
Chloride: 101 mmol/L (ref 98–111)
Creatinine, Ser: 0.56 mg/dL (ref 0.44–1.00)
GFR calc Af Amer: >60 mL/min (ref >60)
GFR calc non Af Amer: >60 mL/min (ref >60)
Glucose, Bld: 94 mg/dL (ref 70–99)
Potassium: 4.2 mmol/L (ref 3.5–5.1)
Sodium: 135 mmol/L (ref 135–145)
Total Bilirubin: 0.5 mg/dL (ref 0.3–1.2)
Total Protein: 7.4 g/dL (ref 6.5–8.1)

## 2018-01-01 LAB — CBC WITH DIFFERENTIAL/PLATELET
Basophils Absolute: 0 10*3/uL (ref 0–0.1)
Basophils Relative: 1 %
Eosinophils Absolute: 0.2 10*3/uL (ref 0–0.7)
Eosinophils Relative: 3 %
HCT: 35.8 % (ref 35.0–47.0)
Hemoglobin: 12.5 g/dL (ref 12.0–16.0)
Lymphocytes Relative: 30 %
Lymphs Abs: 1.9 10*3/uL (ref 1.0–3.6)
MCH: 34.2 pg — ABNORMAL HIGH (ref 26.0–34.0)
MCHC: 34.8 g/dL (ref 32.0–36.0)
MCV: 98.4 fL (ref 80.0–100.0)
Monocytes Absolute: 0.6 10*3/uL (ref 0.2–0.9)
Monocytes Relative: 9 %
Neutro Abs: 3.6 10*3/uL (ref 1.4–6.5)
Neutrophils Relative %: 57 %
Platelets: 275 10*3/uL (ref 150–440)
RBC: 3.64 MIL/uL — ABNORMAL LOW (ref 3.80–5.20)
RDW: 12.9 % (ref 11.5–14.5)
WBC: 6.3 10*3/uL (ref 3.6–11.0)

## 2018-01-01 LAB — FERRITIN: Ferritin: 34 ng/mL (ref 11–307)

## 2018-01-01 MED ORDER — SODIUM CHLORIDE 0.9% FLUSH
10.0000 mL | INTRAVENOUS | Status: AC | PRN
  Administered 2018-01-01: 10 mL via INTRAVENOUS
  Filled 2018-01-01: qty 10

## 2018-01-01 MED ORDER — HEPARIN SOD (PORK) LOCK FLUSH 100 UNIT/ML IV SOLN
500.0000 [IU] | Freq: Once | INTRAVENOUS | Status: AC
  Administered 2018-01-01: 500 [IU] via INTRAVENOUS

## 2018-01-01 NOTE — Progress Notes (Signed)
Patient offers no complaints today. 

## 2018-01-02 LAB — AFP TUMOR MARKER: AFP, Serum, Tumor Marker: 2.1 ng/mL (ref 0.0–8.3)

## 2018-01-10 ENCOUNTER — Other Ambulatory Visit: Payer: Self-pay

## 2018-01-10 MED ORDER — METOPROLOL TARTRATE 25 MG PO TABS: 25.0000 mg | ORAL_TABLET | Freq: Two times a day (BID) | ORAL | 2 refills | Status: DC

## 2018-01-26 ENCOUNTER — Ambulatory Visit (INDEPENDENT_AMBULATORY_CARE_PROVIDER_SITE_OTHER): Payer: PRIVATE HEALTH INSURANCE | Admitting: Nurse Practitioner

## 2018-01-26 ENCOUNTER — Encounter: Payer: Self-pay | Admitting: Nurse Practitioner

## 2018-01-26 VITALS — BP 131/75 | HR 69 | Resp 16 | Ht <= 58 in | Wt 125.0 lb

## 2018-01-26 DIAGNOSIS — Z23 Encounter for immunization: Secondary | ICD-10-CM | POA: Diagnosis not present

## 2018-01-26 DIAGNOSIS — I10 Essential (primary) hypertension: Secondary | ICD-10-CM | POA: Diagnosis not present

## 2018-01-26 DIAGNOSIS — E039 Hypothyroidism, unspecified: Secondary | ICD-10-CM

## 2018-01-26 DIAGNOSIS — Q059 Spina bifida, unspecified: Secondary | ICD-10-CM

## 2018-01-26 MED ORDER — LEVOTHYROXINE SODIUM 50 MCG PO TABS: 50.0000 ug | ORAL_TABLET | Freq: Every day | ORAL | 3 refills | Status: DC

## 2018-01-26 NOTE — Progress Notes (Signed)
[facility] [address] [place], [place] [address]  Internal MEDICINE  Office Visit Note  Patient Name: Christina Stephens  [id number]  [id number]  Date of Service: 02/04/2018  Chief Complaint  Patient presents with  . Medical Management of Chronic Issues    medication refill, pt wants to get a rx for a splint    The patient has complaint of left wrist pain which is intermittent. She had been wearing a brace to provide support. The brace has become very tattered and she can no longer use it.  Needs to have thyroid medication refilled. Last check of thyroid panel was normal.   Current Medication: Outpatient Encounter Medications as of 01/26/2018  Medication Sig Note  . CALCIUM-VITAMIN D PO Take 1 tablet by mouth daily.   Marland Kitchen deferasirox (EXJADE) 250 MG disintegrating tablet Take 1 tablet (250 mg total) by mouth daily before breakfast.   . levothyroxine (SYNTHROID, LEVOTHROID) 50 MCG tablet Take 1 tablet (50 mcg total) by mouth daily.   . metoprolol tartrate (LOPRESSOR) 25 MG tablet Take 1 tablet (25 mg total) by mouth 2 (two) times daily.   . phenytoin (DILANTIN) 100 MG ER capsule TAKE 2 CAPSULES (200 MG TOTAL) BY MOUTH 2 (TWO) TIMES DAILY. 01/02/2015: Received from: External Pharmacy  . polyethylene glycol (MIRALAX / GLYCOLAX) packet 1 POWDER PACK IN 12 OZ WATER DAILY FOR CONSTIPATION   . traMADol (ULTRAM) 50 MG tablet Take 1 tablet (50 mg total) by mouth every 6 (six) hours as needed for moderate pain. (Patient not taking: Reported on 01/01/2018)   . [DISCONTINUED] levothyroxine (SYNTHROID, LEVOTHROID) 50 MCG tablet Take 50 mcg by mouth daily. 01/02/2015: Received from: External Pharmacy   Facility-Administered Encounter Medications as of 01/26/2018  Medication  . sodium chloride flush (NS) 0.9 % injection 10 mL  . sodium chloride flush (NS) 0.9 % injection 10 mL    Surgical History: Past Surgical History:  Procedure Laterality Date  . REDUCTION MAMMAPLASTY Bilateral 1993   . VP STUNT HEAD SURGERYOther]  2000    Medical History: Past Medical History:  Diagnosis Date  . CPAP (continuous positive airway pressure) dependence   . Hereditary hemochromatosis ([place])   . Hypertension   . Seizures ([place])   . Sleep apnea   . Spina bifida ([place])   . Thyroid disease    hypothyroidism    Family History: Family History  Problem Relation Age of Onset  . Ovarian cancer Maternal Aunt   . Hypertension Father   . CAD Father   . Arthritis Sister   . CAD Paternal Grandfather   . Breast cancer Maternal Grandmother     Social History   Socioeconomic History  . Marital status: Single    Spouse name: Not on file  . Number of children: Not on file  . Years of education: Not on file  . Highest education level: Not on file  Occupational History  . Not on file  Social Needs  . Financial resource strain: Not on file  . Food insecurity:    Worry: Not on file    Inability: Not on file  . Transportation needs:    Medical: Not on file    Non-medical: Not on file  Tobacco Use  . Smoking status: Never Smoker  . Smokeless tobacco: Never Used  Substance and Sexual Activity  . Alcohol use: No  . Drug use: No  . Sexual activity: Not on file  Lifestyle  . Physical activity:    Days per  week: Not on file    Minutes per session: Not on file  . Stress: Not on file  Relationships  . Social connections:    Talks on phone: Not on file    Gets together: Not on file    Attends religious service: Not on file    Active member of club or organization: Not on file    Attends meetings of clubs or organizations: Not on file    Relationship status: Not on file  . Intimate partner violence:    Fear of current or ex partner: Not on file    Emotionally abused: Not on file    Physically abused: Not on file    Forced sexual activity: Not on file  Other Topics Concern  . Not on file  Social History Narrative  . Not on file    Review of Systems  Constitutional: Negative  for activity change, chills, fatigue and unexpected weight change.  HENT: Negative for congestion, postnasal drip, rhinorrhea, sneezing and sore throat.   Eyes: Negative.  Negative for redness.  Respiratory: Negative for apnea, cough, chest tightness, shortness of breath and wheezing.   Cardiovascular: Negative for chest pain and palpitations.  Gastrointestinal: Negative for abdominal pain, constipation, diarrhea, nausea and vomiting.  Endocrine: Negative for cold intolerance, heat intolerance, polydipsia, polyphagia and polyuria.       Stable hypothyroid  Genitourinary: Negative.  Negative for dysuria and frequency.  Musculoskeletal: Positive for arthralgias and gait problem. Negative for back pain, joint swelling and neck pain.       Left wrist pain  Skin: Negative for rash.  Allergic/Immunologic: Negative.  Negative for environmental allergies.  Neurological: Positive for seizures. Negative for tremors and numbness.  Hematological: Negative for adenopathy. Does not bruise/bleed easily.  Psychiatric/Behavioral: Negative for behavioral problems (Depression), sleep disturbance and suicidal ideas. The patient is not nervous/anxious.      Today's Vitals   01/26/18 1020  BP: 131/75  Pulse: 69  Resp: 16  SpO2: 98%  Weight: 125 lb (56.7 kg)  Height: 4' 7"  (1.397 m)    Physical Exam  Constitutional: She is oriented to person, place, and time. She appears well-developed and well-nourished. No distress.  HENT:  Head: Normocephalic and atraumatic.  Nose: Nose normal.  Mouth/Throat: Oropharynx is clear and moist. No oropharyngeal exudate.  Eyes: Pupils are equal, round, and reactive to light. Conjunctivae and EOM are normal.  Neck: Normal range of motion. Neck supple. No JVD present. No tracheal deviation present. No thyromegaly present.  Cardiovascular: Normal rate, regular rhythm and normal heart sounds. Exam reveals no gallop and no friction rub.  No murmur heard. Pulmonary/Chest:  Effort normal and breath sounds normal. No respiratory distress. She has no wheezes. She has no rales. She exhibits no tenderness.  Abdominal: Soft. Bowel sounds are normal. There is no tenderness.  Musculoskeletal: Normal range of motion.  Lymphadenopathy:    She has no cervical adenopathy.  Neurological: She is alert and oriented to person, place, and time. No cranial nerve deficit.  Patient is at her neurological baseline.   Skin: Skin is warm and dry. She is not diaphoretic.  Psychiatric: She has a normal mood and affect. Her behavior is normal. Judgment and thought content normal.  Nursing note and vitals reviewed.  Assessment/Plan: 1. Acquired hypothyroidism Most recent thyroid panel stable. Continue levothryoxine as prescribed . - levothyroxine (SYNTHROID, LEVOTHROID) 50 MCG tablet; Take 1 tablet (50 mcg total) by mouth daily.  Dispense: 90 tablet; Refill: 3  2.  Essential hypertension Stable. Continue bp medication as prescribed   3. Spina bifida, unspecified hydrocephalus presence, unspecified spinal region ([place]) Stable.   4. Needs flu shot - Flu Vaccine MDCK QUAD PF  General Counseling: Acelyn verbalizes understanding of the findings of todays visit and agrees with plan of treatment. I have discussed any further diagnostic evaluation that may be needed or ordered today. We also reviewed her medications today. she has been encouraged to call the office with any questions or concerns that should arise related to todays visit.  This patient was seen by Leretha Pol FNP Collaboration with Dr [name] as a part of collaborative care agreement  Orders Placed This Encounter  Procedures  . Flu Vaccine MDCK QUAD PF    Meds ordered this encounter  Medications  . levothyroxine (SYNTHROID, LEVOTHROID) 50 MCG tablet    Sig: Take 1 tablet (50 mcg total) by mouth daily.    Dispense:  90 tablet    Refill:  3    Order Specific Question:   Supervising Provider    Answer:   [name] [[id number]]    Time spent: 42 Minutes      Dr [name] Internal medicine

## 2018-02-04 DIAGNOSIS — Z23 Encounter for immunization: Secondary | ICD-10-CM | POA: Insufficient documentation

## 2018-02-19 ENCOUNTER — Inpatient Hospital Stay: Attending: Hematology and Oncology

## 2018-02-19 ENCOUNTER — Other Ambulatory Visit: Payer: Self-pay | Admitting: *Deleted

## 2018-02-19 DIAGNOSIS — Z452 Encounter for adjustment and management of vascular access device: Secondary | ICD-10-CM | POA: Diagnosis not present

## 2018-02-19 DIAGNOSIS — Z95828 Presence of other vascular implants and grafts: Secondary | ICD-10-CM

## 2018-02-19 MED ORDER — HEPARIN SOD (PORK) LOCK FLUSH 100 UNIT/ML IV SOLN
500.0000 [IU] | Freq: Once | INTRAVENOUS | Status: AC
  Administered 2018-02-19: 500 [IU] via INTRAVENOUS

## 2018-02-19 MED ORDER — DEFERASIROX 250 MG PO TBSO: 250.0000 mg | ORAL_TABLET | Freq: Every day | ORAL | 11 refills | Status: DC

## 2018-02-19 MED ORDER — SODIUM CHLORIDE 0.9% FLUSH
10.0000 mL | Freq: Once | INTRAVENOUS | Status: AC
  Administered 2018-02-19: 10 mL via INTRAVENOUS
  Filled 2018-02-19: qty 10

## 2018-02-23 MED ORDER — DEFERASIROX 250 MG PO TBSO: 250.0000 mg | ORAL_TABLET | Freq: Every day | ORAL | 11 refills | Status: DC

## 2018-02-23 NOTE — Addendum Note (Signed)
Addended by: Betti Cruz on: 02/23/2018 09:29 AM   Modules accepted: Orders

## 2018-02-26 ENCOUNTER — Encounter: Payer: Self-pay | Admitting: Internal Medicine

## 2018-02-26 ENCOUNTER — Ambulatory Visit (INDEPENDENT_AMBULATORY_CARE_PROVIDER_SITE_OTHER): Payer: PRIVATE HEALTH INSURANCE | Admitting: Internal Medicine

## 2018-02-26 ENCOUNTER — Telehealth: Payer: Self-pay | Admitting: Nurse Practitioner

## 2018-02-26 VITALS — BP 132/79 | HR 74 | Resp 16 | Ht <= 58 in | Wt 125.0 lb

## 2018-02-26 DIAGNOSIS — Q059 Spina bifida, unspecified: Secondary | ICD-10-CM

## 2018-02-26 DIAGNOSIS — G4733 Obstructive sleep apnea (adult) (pediatric): Secondary | ICD-10-CM

## 2018-02-26 DIAGNOSIS — G40909 Epilepsy, unspecified, not intractable, without status epilepticus: Secondary | ICD-10-CM

## 2018-02-26 DIAGNOSIS — I10 Essential (primary) hypertension: Secondary | ICD-10-CM

## 2018-02-26 DIAGNOSIS — Z9989 Dependence on other enabling machines and devices: Secondary | ICD-10-CM

## 2018-02-26 NOTE — Progress Notes (Signed)
[facility] [place], [place] [address]  Pulmonary Sleep Medicine   Office Visit Note  Patient Name: Christina Stephens DOB: 06-04-1975 MRN [id number]  Date of Service: 02/26/2018  Complaints/HPI: Pt here for follow on OSA.  She is doing well with her cpap compliance.  She does not go a single night without it.  She reports excellent results when wearing it.  Denies major issues.  Her DME company is sending her supplies regularly, and she has so clean machine.  She denies any recent hospitalizations since last visit.    ROS  General: (-) fever, (-) chills, (-) night sweats, (-) weakness Skin: (-) rashes, (-) itching,. Eyes: (-) visual changes, (-) redness, (-) itching. Nose and Sinuses: (-) nasal stuffiness or itchiness, (-) postnasal drip, (-) nosebleeds, (-) sinus trouble. Mouth and Throat: (-) sore throat, (-) hoarseness. Neck: (-) swollen glands, (-) enlarged thyroid, (-) neck pain. Respiratory: - cough, (-) bloody sputum, - shortness of breath, - wheezing. Cardiovascular: - ankle swelling, (-) chest pain. Lymphatic: (-) lymph node enlargement. Neurologic: (-) numbness, (-) tingling. Psychiatric: (-) anxiety, (-) depression   Current Medication: Outpatient Encounter Medications as of 02/26/2018  Medication Sig Note  . CALCIUM-VITAMIN D PO Take 1 tablet by mouth daily.   Marland Kitchen deferasirox (EXJADE) 250 MG disintegrating tablet Take 1 tablet (250 mg total) by mouth daily before breakfast.   . levothyroxine (SYNTHROID, LEVOTHROID) 50 MCG tablet Take 1 tablet (50 mcg total) by mouth daily.   . metoprolol tartrate (LOPRESSOR) 25 MG tablet Take 1 tablet (25 mg total) by mouth 2 (two) times daily.   . phenytoin (DILANTIN) 100 MG ER capsule TAKE 2 CAPSULES (200 MG TOTAL) BY MOUTH 2 (TWO) TIMES DAILY. 01/02/2015: Received from: External Pharmacy  . polyethylene glycol (MIRALAX / GLYCOLAX) packet 1 POWDER PACK IN 12 OZ WATER DAILY FOR CONSTIPATION   . traMADol  (ULTRAM) 50 MG tablet Take 1 tablet (50 mg total) by mouth every 6 (six) hours as needed for moderate pain.    Facility-Administered Encounter Medications as of 02/26/2018  Medication  . sodium chloride flush (NS) 0.9 % injection 10 mL  . sodium chloride flush (NS) 0.9 % injection 10 mL    Surgical History: Past Surgical History:  Procedure Laterality Date  . REDUCTION MAMMAPLASTY Bilateral 1993  . VP STUNT HEAD SURGERYOther]  2000    Medical History: Past Medical History:  Diagnosis Date  . CPAP (continuous positive airway pressure) dependence   . Hereditary hemochromatosis ([place])   . Hypertension   . Seizures ([place])   . Sleep apnea   . Spina bifida ([place])   . Thyroid disease    hypothyroidism    Family History: Family History  Problem Relation Age of Onset  . Ovarian cancer Maternal Aunt   . Hypertension Father   . CAD Father   . Arthritis Sister   . CAD Paternal Grandfather   . Breast cancer Maternal Grandmother     Social History: Social History   Socioeconomic History  . Marital status: Single    Spouse name: Not on file  . Number of children: Not on file  . Years of education: Not on file  . Highest education level: Not on file  Occupational History  . Not on file  Social Needs  . Financial resource strain: Not on file  . Food insecurity:    Worry: Not on file    Inability: Not on file  . Transportation needs:  Medical: Not on file    Non-medical: Not on file  Tobacco Use  . Smoking status: Never Smoker  . Smokeless tobacco: Never Used  Substance and Sexual Activity  . Alcohol use: No  . Drug use: No  . Sexual activity: Not on file  Lifestyle  . Physical activity:    Days per week: Not on file    Minutes per session: Not on file  . Stress: Not on file  Relationships  . Social connections:    Talks on phone: Not on file    Gets together: Not on file    Attends religious service: Not on file    Active member of club or organization: Not on  file    Attends meetings of clubs or organizations: Not on file    Relationship status: Not on file  . Intimate partner violence:    Fear of current or ex partner: Not on file    Emotionally abused: Not on file    Physically abused: Not on file    Forced sexual activity: Not on file  Other Topics Concern  . Not on file  Social History Narrative  . Not on file    Vital Signs: Blood pressure 132/79, pulse 74, resp. rate 16, height _0  (1.397 m), weight 125 lb (56.7 kg), SpO2 100 %.  Examination: General Appearance: The patient is well-developed, well-nourished, and in no distress. Skin: Gross inspection of skin unremarkable. Head: normocephalic, no gross deformities. Eyes: no gross deformities noted. ENT: ears appear grossly normal no exudates. Neck: Supple. No thyromegaly. No LAD. Respiratory: Clear to auscultation. Cardiovascular: Normal S1 and S2 without murmur or rub. Extremities: No cyanosis. pulses are equal. Neurologic: Alert and oriented. No involuntary movements.  LABS: Recent Results (from the past 2160 hour(s))  AFP tumor marker     Status: None   Collection Time: 01/01/18 10:59 AM  Result Value Ref Range   AFP, Serum, Tumor Marker 2.1 0.0 - 8.3 ng/mL    Comment: (NOTE) Roche Diagnostics Electrochemiluminescence Immunoassay (ECLIA) Values obtained with different assay methods or kits cannot be used interchangeably.  Results cannot be interpreted as absolute evidence of the presence or absence of malignant disease. This test is not interpretable in pregnant females. Performed At: [facility] [place], [place] [id number] Rush Farmer MD LT:[id number]   Ferritin     Status: None   Collection Time: 01/01/18 10:59 AM  Result Value Ref Range   Ferritin 34 11 - 307 ng/mL    Comment: Performed at [facility], [address] [place], [place] [address]  Comprehensive metabolic panel     Status: Abnormal   Collection Time:  01/01/18 10:59 AM  Result Value Ref Range   Sodium 135 135 - 145 mmol/L   Potassium 4.2 3.5 - 5.1 mmol/L   Chloride 101 98 - 111 mmol/L   CO2 26 22 - 32 mmol/L   Glucose, Bld 94 70 - 99 mg/dL   BUN 16 6 - 20 mg/dL   Creatinine, Ser 0.56 0.44 - 1.00 mg/dL   Calcium 8.8 (L) 8.9 - 10.3 mg/dL   Total Protein 7.4 6.5 - 8.1 g/dL   Albumin 4.1 3.5 - 5.0 g/dL   AST 30 15 - 41 U/L   ALT 32 0 - 44 U/L   Alkaline Phosphatase 69 38 - 126 U/L   Total Bilirubin 0.5 0.3 - 1.2 mg/dL   GFR calc non Af Amer >60 >60 mL/min   GFR calc  Af Amer >60 >60 mL/min    Comment: (NOTE) The eGFR has been calculated using the CKD EPI equation. This calculation has not been validated in all clinical situations. eGFR's persistently <60 mL/min signify possible Chronic Kidney Disease.    Anion gap 8 5 - 15    Comment: Performed at [facility], [address] [place], [place] [address]  CBC with Differential     Status: Abnormal   Collection Time: 01/01/18 10:59 AM  Result Value Ref Range   WBC 6.3 3.6 - 11.0 K/uL   RBC 3.64 (L) 3.80 - 5.20 MIL/uL   Hemoglobin 12.5 12.0 - 16.0 g/dL   HCT 35.8 35.0 - 47.0 %   MCV 98.4 80.0 - 100.0 fL   MCH 34.2 (H) 26.0 - 34.0 pg   MCHC 34.8 32.0 - 36.0 g/dL   RDW 12.9 11.5 - 14.5 %   Platelets 275 150 - 440 K/uL   Neutrophils Relative % 57 %   Neutro Abs 3.6 1.4 - 6.5 K/uL   Lymphocytes Relative 30 %   Lymphs Abs 1.9 1.0 - 3.6 K/uL   Monocytes Relative 9 %   Monocytes Absolute 0.6 0.2 - 0.9 K/uL   Eosinophils Relative 3 %   Eosinophils Absolute 0.2 0 - 0.7 K/uL   Basophils Relative 1 %   Basophils Absolute 0.0 0 - 0.1 K/uL    Comment: Performed at [facility], [address], [place], [place] [address]    Radiology: US Abdomen Limited Ruq  Result Date: 12/12/2017 CLINICAL DATA:  Hemochromatosis, gallstones. EXAM: ULTRASOUND ABDOMEN LIMITED RIGHT UPPER QUADRANT COMPARISON:  Limited right upper quadrant abdominal ultrasound. FINDINGS: Gallbladder: The  gallbladder is adequately distended. There are echogenic mobile shadowing stones measuring up to 7 mm in diameter. There is no gallbladder wall thickening, pericholecystic fluid, or positive sonographic Murphy's sign. Common bile duct: Diameter: 3 mm Liver: No focal lesion identified. Within normal limits in parenchymal echogenicity. There is no ductal dilation. The surface contour remains smooth. Portal vein is patent on color Doppler imaging with normal direction of blood flow towards the liver. IMPRESSION: Gallstones without sonographic evidence of acute cholecystitis. Normal appearance of the common bile duct and liver. Electronically Signed   By: David  Martinique M.D.   On: 12/12/2017 11:07    No results found.  No results found.    Assessment and Plan: Patient Active Problem List   Diagnosis Date Noted  . Needs flu shot 02/04/2018  . Hypertension 05/31/2017  . Sleep apnea 05/31/2017  . Spina bifida ([place]) 05/31/2017  . Hemochromatosis 11/28/2014    1. OSA on CPAP Patient continues to wear his CPAP every night without difficulty.  She shows 100% compliance on her download.  Continue current pressures as before.  2. Essential hypertension Blood pressures well controlled on current medication regimen.  Continue current therapy  3. Spina bifida, unspecified hydrocephalus presence, unspecified spinal region [facility]) Spina bifida is stable at this time continue current therapy regimen.  4. Seizure disorder [facility]) Patient has not had a seizure in nearly 20 years.  Neurology reports she will need to continue to take her medications for the rest of her life.  General Counseling: I have discussed the findings of the evaluation and examination with Kaavya.  I have also discussed any further diagnostic evaluation thatmay be needed or ordered today. Jacquelyne verbalizes understanding of the findings of todays visit. We also reviewed her medications today and discussed drug interactions and side effects  including but  not limited excessive drowsiness and altered mental states. We also discussed that there is always a risk not just to her but also people around her. she has been encouraged to call the office with any questions or concerns that should arise related to todays visit.    Time spent: 25 This patient was seen by Orson Gear AGNP-C in Collaboration with Dr. Devona Konig as a part of collaborative care agreement.  I have personally obtained a history, examined the patient, evaluated laboratory and imaging results, formulated the assessment and plan and placed orders.    Allyne Gee, MD [facility] Pulmonary and Critical Care Sleep medicine

## 2018-02-26 NOTE — Patient Instructions (Signed)
Sleep Apnea Sleep apnea is a condition that affects breathing. People with sleep apnea have moments during sleep when their breathing pauses briefly or gets shallow. Sleep apnea can cause these symptoms:  Trouble staying asleep.  Sleepiness or tiredness during the day.  Irritability.  Loud snoring.  Morning headaches.  Trouble concentrating.  Forgetting things.  Less interest in sex.  Being sleepy for no reason.  Mood swings.  Personality changes.  Depression.  Waking up a lot during the night to pee (urinate).  Dry mouth.  Sore throat.  Follow these instructions at home:  Make any changes in your routine that your doctor recommends.  Eat a healthy, well-balanced diet.  Take over-the-counter and prescription medicines only as told by your doctor.  Avoid using alcohol, calming medicines (sedatives), and narcotic medicines.  Take steps to lose weight if you are overweight.  If you were given a machine (device) to use while you sleep, use it only as told by your doctor.  Do not use any tobacco products, such as cigarettes, chewing tobacco, and e-cigarettes. If you need help quitting, ask your doctor.  Keep all follow-up visits as told by your doctor. This is important. Contact a doctor if:  The machine that you were given to use during sleep is uncomfortable or does not seem to be working.  Your symptoms do not get better.  Your symptoms get worse. Get help right away if:  Your chest hurts.  You have trouble breathing in enough air (shortness of breath).  You have an uncomfortable feeling in your back, arms, or stomach.  You have trouble talking.  One side of your body feels weak.  A part of your face is hanging down (drooping). These symptoms may be an emergency. Do not wait to see if the symptoms will go away. Get medical help right away. Call your local emergency services (911 in the [place] Do not drive yourself to the hospital. This information  is not intended to replace advice given to you by your health care provider. Make sure you discuss any questions you have with your health care provider. Document Released: 02/02/2008 Document Revised: 12/20/2015 Document Reviewed: 02/02/2015 Elsevier Interactive Patient Education  [employer].

## 2018-02-28 NOTE — Telephone Encounter (Signed)
Hey. Do you have this paperwork? I don't have it on my desk.

## 2018-02-28 NOTE — Telephone Encounter (Signed)
Ok thanks 

## 2018-03-14 ENCOUNTER — Ambulatory Visit: Payer: PRIVATE HEALTH INSURANCE

## 2018-03-26 ENCOUNTER — Telehealth: Payer: Self-pay | Admitting: Nurse Practitioner

## 2018-03-26 NOTE — Telephone Encounter (Signed)
FAXED LMN TO [facility] [phone]

## 2018-04-09 ENCOUNTER — Inpatient Hospital Stay: Attending: Hematology and Oncology

## 2018-04-09 DIAGNOSIS — Z95828 Presence of other vascular implants and grafts: Secondary | ICD-10-CM

## 2018-04-09 DIAGNOSIS — Z452 Encounter for adjustment and management of vascular access device: Secondary | ICD-10-CM | POA: Diagnosis not present

## 2018-04-09 MED ORDER — SODIUM CHLORIDE 0.9% FLUSH
10.0000 mL | INTRAVENOUS | Status: DC | PRN
  Administered 2018-04-09: 10 mL via INTRAVENOUS
  Filled 2018-04-09: qty 10

## 2018-04-09 MED ORDER — HEPARIN SOD (PORK) LOCK FLUSH 100 UNIT/ML IV SOLN
500.0000 [IU] | Freq: Once | INTRAVENOUS | Status: AC
  Administered 2018-04-09: 500 [IU] via INTRAVENOUS
  Filled 2018-04-09: qty 5

## 2018-04-17 ENCOUNTER — Other Ambulatory Visit: Payer: Self-pay

## 2018-04-17 MED ORDER — METOPROLOL TARTRATE 25 MG PO TABS: 25.0000 mg | ORAL_TABLET | Freq: Two times a day (BID) | ORAL | 2 refills | Status: DC

## 2018-05-21 ENCOUNTER — Inpatient Hospital Stay: Attending: Oncology

## 2018-05-21 DIAGNOSIS — Z452 Encounter for adjustment and management of vascular access device: Secondary | ICD-10-CM | POA: Insufficient documentation

## 2018-05-21 DIAGNOSIS — Z95828 Presence of other vascular implants and grafts: Secondary | ICD-10-CM

## 2018-05-21 MED ORDER — SODIUM CHLORIDE 0.9% FLUSH
10.0000 mL | Freq: Once | INTRAVENOUS | Status: AC
  Administered 2018-05-21: 10 mL via INTRAVENOUS
  Filled 2018-05-21: qty 10

## 2018-05-21 MED ORDER — HEPARIN SOD (PORK) LOCK FLUSH 100 UNIT/ML IV SOLN
500.0000 [IU] | Freq: Once | INTRAVENOUS | Status: AC
  Administered 2018-05-21: 500 [IU] via INTRAVENOUS

## 2018-05-28 ENCOUNTER — Ambulatory Visit: Payer: Self-pay | Admitting: Nurse Practitioner

## 2018-06-04 ENCOUNTER — Encounter: Payer: Self-pay | Admitting: Nurse Practitioner

## 2018-06-04 ENCOUNTER — Ambulatory Visit (INDEPENDENT_AMBULATORY_CARE_PROVIDER_SITE_OTHER): Payer: PRIVATE HEALTH INSURANCE | Admitting: Nurse Practitioner

## 2018-06-04 VITALS — BP 130/80 | HR 72 | Resp 16 | Ht <= 58 in | Wt 121.0 lb

## 2018-06-04 DIAGNOSIS — G40909 Epilepsy, unspecified, not intractable, without status epilepticus: Secondary | ICD-10-CM

## 2018-06-04 DIAGNOSIS — Z1239 Encounter for other screening for malignant neoplasm of breast: Secondary | ICD-10-CM

## 2018-06-04 DIAGNOSIS — E039 Hypothyroidism, unspecified: Secondary | ICD-10-CM

## 2018-06-04 DIAGNOSIS — I10 Essential (primary) hypertension: Secondary | ICD-10-CM | POA: Diagnosis not present

## 2018-06-04 NOTE — Progress Notes (Deleted)
[facility] [place], [place] [address]  Internal MEDICINE  Office Visit Note  Patient Name: Christina Stephens  [id number]  [id number]  Date of Service: 06/04/2018   Pt is here for routine health maintenance examination  Chief Complaint  Patient presents with  . Hypertension  . Quality Metric Gaps    AWv with pap     The patient is here for routine follow up visit. She has not concerns or complaints today. Labs done in 12/2017 were stable. Recommended she take increased dose of oral calcium. She will be due for screening mammogram in 06/2018.     Current Medication: Outpatient Encounter Medications as of 06/04/2018  Medication Sig Note  . CALCIUM-VITAMIN D PO Take 1 tablet by mouth daily.   Marland Kitchen deferasirox (EXJADE) 250 MG disintegrating tablet Take 1 tablet (250 mg total) by mouth daily before breakfast.   . levothyroxine (SYNTHROID, LEVOTHROID) 50 MCG tablet Take 1 tablet (50 mcg total) by mouth daily.   . metoprolol tartrate (LOPRESSOR) 25 MG tablet Take 1 tablet (25 mg total) by mouth 2 (two) times daily.   . phenytoin (DILANTIN) 100 MG ER capsule TAKE 2 CAPSULES (200 MG TOTAL) BY MOUTH 2 (TWO) TIMES DAILY. 01/02/2015: Received from: External Pharmacy  . polyethylene glycol (MIRALAX / GLYCOLAX) packet 1 POWDER PACK IN 12 OZ WATER DAILY FOR CONSTIPATION   . traMADol (ULTRAM) 50 MG tablet Take 1 tablet (50 mg total) by mouth every 6 (six) hours as needed for moderate pain.    Facility-Administered Encounter Medications as of 06/04/2018  Medication  . sodium chloride flush (NS) 0.9 % injection 10 mL  . sodium chloride flush (NS) 0.9 % injection 10 mL    Surgical History: Past Surgical History:  Procedure Laterality Date  . REDUCTION MAMMAPLASTY Bilateral 1993  . VP STUNT HEAD SURGERYOther]  2000    Medical History: Past Medical History:  Diagnosis Date  . CPAP (continuous positive airway pressure) dependence   . Hereditary hemochromatosis ([place])    . Hypertension   . Seizures ([place])   . Sleep apnea   . Spina bifida ([place])   . Thyroid disease    hypothyroidism    Family History: Family History  Problem Relation Age of Onset  . Ovarian cancer Maternal Aunt   . Hypertension Father   . CAD Father   . Arthritis Sister   . CAD Paternal Grandfather   . Breast cancer Maternal Grandmother       Review of Systems   Vital Signs: BP 130/80   Pulse 72   Resp 16   Ht 4' 7"  (1.397 m)   Wt 121 lb (54.9 kg)   SpO2 98%   BMI 28.12 kg/m    Physical Exam   LABS: No results found for this or any previous visit (from the past 2160 hour(s)).  Marland KitchenMAMM    Assessment/Plan:   General Counseling: Christina Stephens verbalizes understanding of the findings of todays visit and agrees with plan of treatment. I have discussed any further diagnostic evaluation that may be needed or ordered today. We also reviewed her medications today. she has been encouraged to call the office with any questions or concerns that should arise related to todays visit.    Counseling:    No orders of the defined types were placed in this encounter.   No orders of the defined types were placed in this encounter.   Time spent:*** Minutes      [name], MD  Internal Medicine

## 2018-06-12 DIAGNOSIS — Z124 Encounter for screening for malignant neoplasm of cervix: Secondary | ICD-10-CM | POA: Insufficient documentation

## 2018-06-12 DIAGNOSIS — Z1239 Encounter for other screening for malignant neoplasm of breast: Secondary | ICD-10-CM

## 2018-06-12 DIAGNOSIS — E039 Hypothyroidism, unspecified: Secondary | ICD-10-CM | POA: Insufficient documentation

## 2018-06-12 NOTE — Progress Notes (Signed)
[facility] [place], [place] [address]  Internal MEDICINE  Office Visit Note  Patient Name: Christina Stephens  [id number]  [id number]  Date of Service: 06/12/2018  Chief Complaint  Patient presents with  . Hypertension  . Quality Metric Gaps    AWv with pap    HPI  The patient is here for routine follow up visit. She has not concerns or complaints today. Labs done in 12/2017 were stable. Recommended she take increased dose of oral calcium. She will be due for screening mammogram in 06/2018.    Current Medication: Outpatient Encounter Medications as of 06/04/2018  Medication Sig Note  . CALCIUM-VITAMIN D PO Take 1 tablet by mouth daily.   Marland Kitchen deferasirox (EXJADE) 250 MG disintegrating tablet Take 1 tablet (250 mg total) by mouth daily before breakfast.   . levothyroxine (SYNTHROID, LEVOTHROID) 50 MCG tablet Take 1 tablet (50 mcg total) by mouth daily.   . metoprolol tartrate (LOPRESSOR) 25 MG tablet Take 1 tablet (25 mg total) by mouth 2 (two) times daily.   . phenytoin (DILANTIN) 100 MG ER capsule TAKE 2 CAPSULES (200 MG TOTAL) BY MOUTH 2 (TWO) TIMES DAILY. 01/02/2015: Received from: External Pharmacy  . polyethylene glycol (MIRALAX / GLYCOLAX) packet 1 POWDER PACK IN 12 OZ WATER DAILY FOR CONSTIPATION   . traMADol (ULTRAM) 50 MG tablet Take 1 tablet (50 mg total) by mouth every 6 (six) hours as needed for moderate pain.    Facility-Administered Encounter Medications as of 06/04/2018  Medication  . sodium chloride flush (NS) 0.9 % injection 10 mL  . sodium chloride flush (NS) 0.9 % injection 10 mL    Surgical History: Past Surgical History:  Procedure Laterality Date  . REDUCTION MAMMAPLASTY Bilateral 1993  . VP STUNT HEAD SURGERYOther]  2000    Medical History: Past Medical History:  Diagnosis Date  . CPAP (continuous positive airway pressure) dependence   . Hereditary hemochromatosis ([place])   . Hypertension   . Seizures ([place])   . Sleep apnea   .  Spina bifida ([place])   . Thyroid disease    hypothyroidism    Family History: Family History  Problem Relation Age of Onset  . Ovarian cancer Maternal Aunt   . Hypertension Father   . CAD Father   . Arthritis Sister   . CAD Paternal Grandfather   . Breast cancer Maternal Grandmother     Social History   Socioeconomic History  . Marital status: Single    Spouse name: Not on file  . Number of children: Not on file  . Years of education: Not on file  . Highest education level: Not on file  Occupational History  . Not on file  Social Needs  . Financial resource strain: Not on file  . Food insecurity:    Worry: Not on file    Inability: Not on file  . Transportation needs:    Medical: Not on file    Non-medical: Not on file  Tobacco Use  . Smoking status: Never Smoker  . Smokeless tobacco: Never Used  Substance and Sexual Activity  . Alcohol use: No  . Drug use: No  . Sexual activity: Not on file  Lifestyle  . Physical activity:    Days per week: Not on file    Minutes per session: Not on file  . Stress: Not on file  Relationships  . Social connections:    Talks on phone: Not on file    Gets  together: Not on file    Attends religious service: Not on file    Active member of club or organization: Not on file    Attends meetings of clubs or organizations: Not on file    Relationship status: Not on file  . Intimate partner violence:    Fear of current or ex partner: Not on file    Emotionally abused: Not on file    Physically abused: Not on file    Forced sexual activity: Not on file  Other Topics Concern  . Not on file  Social History Narrative  . Not on file      Review of Systems  Constitutional: Negative for activity change, chills, fatigue and unexpected weight change.  HENT: Negative for congestion, postnasal drip, rhinorrhea, sneezing and sore throat.   Respiratory: Negative for apnea, cough, chest tightness, shortness of breath and wheezing.    Cardiovascular: Negative for chest pain and palpitations.  Gastrointestinal: Negative for abdominal pain, constipation, diarrhea, nausea and vomiting.  Endocrine: Negative for cold intolerance, heat intolerance, polydipsia and polyuria.       Stable hypothyroid  Musculoskeletal: Positive for arthralgias and gait problem. Negative for back pain, joint swelling and neck pain.  Skin: Negative for rash.  Allergic/Immunologic: Negative.  Negative for environmental allergies.  Neurological: Positive for seizures. Negative for tremors and numbness.  Hematological: Negative for adenopathy. Does not bruise/bleed easily.  Psychiatric/Behavioral: Negative for behavioral problems (Depression), sleep disturbance and suicidal ideas. The patient is not nervous/anxious.     Today's Vitals   06/04/18 1012  BP: 130/80  Pulse: 72  Resp: 16  SpO2: 98%  Weight: 121 lb (54.9 kg)  Height: 4' 7"  (1.397 m)    Physical Exam Vitals signs and nursing note reviewed.  Constitutional:      General: She is not in acute distress.    Appearance: Normal appearance. She is well-developed. She is not diaphoretic.  HENT:     Head: Normocephalic and atraumatic.     Nose: Nose normal.     Mouth/Throat:     Pharynx: No oropharyngeal exudate.  Eyes:     Conjunctiva/sclera: Conjunctivae normal.     Pupils: Pupils are equal, round, and reactive to light.  Neck:     Musculoskeletal: Normal range of motion and neck supple.     Thyroid: No thyromegaly.     Vascular: No JVD.     Trachea: No tracheal deviation.  Cardiovascular:     Rate and Rhythm: Normal rate and regular rhythm.     Heart sounds: Normal heart sounds. No murmur. No friction rub. No gallop.   Pulmonary:     Effort: Pulmonary effort is normal. No respiratory distress.     Breath sounds: Normal breath sounds. No wheezing or rales.  Chest:     Chest wall: No tenderness.  Abdominal:     General: Bowel sounds are normal.     Palpations: Abdomen is  soft.     Tenderness: There is no abdominal tenderness.  Musculoskeletal: Normal range of motion.  Lymphadenopathy:     Cervical: No cervical adenopathy.  Skin:    General: Skin is warm and dry.  Neurological:     Mental Status: She is alert and oriented to person, place, and time.     Cranial Nerves: No cranial nerve deficit.     Comments: Patient is at her neurological baseline.   Psychiatric:        Behavior: Behavior normal.  Thought Content: Thought content normal.        Judgment: Judgment normal.   Assessment/Plan: 1. Essential hypertension bp stable. Continue metoprolol as prescribed   2. Acquired hypothyroidism thyroie panel stable. Continue levothyroxine as prescribed   3. Seizure disorder ([place]) Continue dilantin as prescribed   4. Screening for breast cancer - MM DIGITAL SCREENING BILATERAL; Future  General Counseling: Jaelie verbalizes understanding of the findings of todays visit and agrees with plan of treatment. I have discussed any further diagnostic evaluation that may be needed or ordered today. We also reviewed her medications today. she has been encouraged to call the office with any questions or concerns that should arise related to todays visit.  This patient was seen by Leretha Pol FNP Collaboration with Dr [name] as a part of collaborative care agreement  Orders Placed This Encounter  Procedures  . MM DIGITAL SCREENING BILATERAL     Time spent: 28 Minutes      Dr [name] Internal medicine

## 2018-06-14 ENCOUNTER — Ambulatory Visit: Admitting: Hematology and Oncology

## 2018-06-14 ENCOUNTER — Other Ambulatory Visit

## 2018-06-20 ENCOUNTER — Ambulatory Visit
Admission: RE | Admit: 2018-06-20 | Discharge: 2018-06-20 | Disposition: A | Discharge status: Home / Self Care | Source: Ambulatory Visit | Attending: Urgent Care | Admitting: Urgent Care

## 2018-06-29 ENCOUNTER — Other Ambulatory Visit: Payer: Self-pay

## 2018-07-01 NOTE — Progress Notes (Signed)
[place]  [phone] [phone]  ID: Christina Stephens OB: 1975-07-26  MR#: [id number]  UUV#:[id number]  Patient Care Team: [name], MD as PCP - General (Internal Medicine)  CHIEF COMPLAINT: Hereditary hemochromatosis.  INTERVAL HISTORY: Patient is a 43 year old female was previously seen by Dr. Mike Gip who is transferring care.  She returns to clinic today for routine six-month follow-up and laboratory work.  She continues to feel well and is at her baseline.  She has no neurologic complaints.  She denies any recent fevers or illnesses.  She has a good appetite and denies weight loss.  She has no chest pain or shortness of breath.  She denies any nausea, vomiting, constipation, or diarrhea.  She has no urinary complaints.  Patient feels at her baseline offers no specific complaints today.  REVIEW OF SYSTEMS:   Review of Systems  Constitutional: Negative.  Negative for fever, malaise/fatigue and weight loss.  Respiratory: Negative.  Negative for cough, hemoptysis and shortness of breath.   Cardiovascular: Negative.  Negative for chest pain and leg swelling.  Gastrointestinal: Negative.  Negative for abdominal pain.  Genitourinary: Negative.  Negative for dysuria.  Musculoskeletal: Negative.  Negative for back pain.  Skin: Negative.  Negative for rash.  Neurological: Negative.  Negative for dizziness, focal weakness and weakness.  Psychiatric/Behavioral: Negative.  The patient is not nervous/anxious.     As per HPI. Otherwise, a complete review of systems is negative.  PAST MEDICAL HISTORY: Past Medical History:  Diagnosis Date  . CPAP (continuous positive airway pressure) dependence   . Hereditary hemochromatosis ([place])   . Hypertension   . Seizures ([place])   . Sleep apnea   . Spina bifida ([place])   . Thyroid disease    hypothyroidism    PAST SURGICAL HISTORY: Past Surgical History:  Procedure Laterality Date  . REDUCTION  MAMMAPLASTY Bilateral 1993  . VP STUNT HEAD SURGERYOther]  2000    FAMILY HISTORY: Family History  Problem Relation Age of Onset  . Ovarian cancer Maternal Aunt   . Hypertension Father   . CAD Father   . Arthritis Sister   . CAD Paternal Grandfather   . Breast cancer Maternal Grandmother     ADVANCED DIRECTIVES (Y/N):  N  HEALTH MAINTENANCE: Social History   Tobacco Use  . Smoking status: Never Smoker  . Smokeless tobacco: Never Used  Substance Use Topics  . Alcohol use: No  . Drug use: No     Colonoscopy:  PAP:  Bone density:  Lipid panel:  Allergies  Allergen Reactions  . Latex     Pt has Spina bifada and was told that if she has surgery she should avoid latex for poss. Reaction/infection.    Current Outpatient Medications  Medication Sig Dispense Refill  . CALCIUM-VITAMIN D PO Take 1 tablet by mouth daily.    Marland Kitchen deferasirox (EXJADE) 250 MG disintegrating tablet Take 1 tablet (250 mg total) by mouth daily before breakfast. 30 tablet 11  . levothyroxine (SYNTHROID, LEVOTHROID) 50 MCG tablet Take 1 tablet (50 mcg total) by mouth daily. 90 tablet 3  . metoprolol tartrate (LOPRESSOR) 25 MG tablet Take 1 tablet (25 mg total) by mouth 2 (two) times daily. 60 tablet 2  . phenytoin (DILANTIN) 100 MG ER capsule TAKE 2 CAPSULES (200 MG TOTAL) BY MOUTH 2 (TWO) TIMES DAILY.  11  . polyethylene glycol (MIRALAX / GLYCOLAX) packet 1 POWDER PACK IN 12 OZ WATER DAILY FOR CONSTIPATION    .  traMADol (ULTRAM) 50 MG tablet Take 1 tablet (50 mg total) by mouth every 6 (six) hours as needed for moderate pain. 12 tablet 0   No current facility-administered medications for this visit.    Facility-Administered Medications Ordered in Other Visits  Medication Dose Route Frequency Provider Last Rate Last Dose  . sodium chloride flush (NS) 0.9 % injection 10 mL  10 mL Intravenous PRN Cammie Sickle, MD   10 mL at 02/26/16 0917  . sodium chloride flush (NS) 0.9 % injection 10 mL  10 mL  Intravenous PRN Nolon Stalls C, MD   10 mL at 01/01/18 1108    OBJECTIVE: Vitals:   07/03/18 1016  BP: (!) 143/73  Pulse: 80  Temp: 98.5 F (36.9 C)     Body mass index is 27.89 kg/m.    ECOG FS:0 - Asymptomatic  General: Well-developed, well-nourished, no acute distress. Eyes: Pink conjunctiva, anicteric sclera. HEENT: Normocephalic, moist mucous membranes, clear oropharnyx. Lungs: Clear to auscultation bilaterally. Heart: Regular rate and rhythm. No rubs, murmurs, or gallops. Abdomen: Soft, nontender, nondistended. No organomegaly noted, normoactive bowel sounds. Musculoskeletal: No edema, cyanosis, or clubbing.  Braces on bilateral legs. Neuro: Alert, answering all questions appropriately. Cranial nerves grossly intact. Skin: No rashes or petechiae noted. Psych: Normal affect. Lymphatics: No cervical, calvicular, axillary or inguinal LAD.   LAB RESULTS:  Lab Results  Component Value Date   NA 136 07/03/2018   K 3.8 07/03/2018   CL 102 07/03/2018   CO2 27 07/03/2018   GLUCOSE 114 (H) 07/03/2018   BUN 13 07/03/2018   CREATININE 0.52 07/03/2018   CALCIUM 9.0 07/03/2018   PROT 7.8 07/03/2018   ALBUMIN 4.1 07/03/2018   AST 25 07/03/2018   ALT 21 07/03/2018   ALKPHOS 67 07/03/2018   BILITOT 0.4 07/03/2018   GFRNONAA >60 07/03/2018   GFRAA >60 07/03/2018    Lab Results  Component Value Date   WBC 6.1 07/03/2018   NEUTROABS 3.5 07/03/2018   HGB 12.1 07/03/2018   HCT 35.4 (L) 07/03/2018   MCV 97.0 07/03/2018   PLT 296 07/03/2018   Lab Results  Component Value Date   IRON 138 06/19/2015   TIBC 228 (L) 06/19/2015   IRONPCTSAT 60 (H) 06/19/2015   Lab Results  Component Value Date   FERRITIN 37 07/03/2018     STUDIES: US Abdomen Limited Ruq  Result Date: 06/20/2018 CLINICAL DATA:  Hemochromatosis. EXAM: ULTRASOUND ABDOMEN LIMITED RIGHT UPPER QUADRANT COMPARISON:  Ultrasound of December 12, 2017. FINDINGS: Gallbladder: Cholelithiasis is noted with  largest gallstone measuring 1 cm. No gallbladder wall thickening or pericholecystic fluid is noted. No sonographic Murphy's sign is noted. Common bile duct: Diameter: 2.5 mm which is within normal limits. Liver: No focal lesion identified. Within normal limits in parenchymal echogenicity. Portal vein is patent on color Doppler imaging with normal direction of blood flow towards the liver. IMPRESSION: Cholelithiasis without evidence of cholecystitis. No other abnormality seen in the right upper quadrant of the abdomen. Electronically Signed   By: Marijo Conception, M.D.   On: 06/20/2018 14:33    ASSESSMENT: Hereditary hemochromatosis.   PLAN:    1. Hereditary hemochromatosis: Originally diagnosed in July 2011 with homozygous C282Y mutation.  Previously, patient had a difficult time with phlebotomy and was initiated on Exjade for iron chelation.  Her current dose is 250 mg daily which is been stable and unchanged since December 2014.  Her most recent hemoglobin continues to be within normal limits.  Ferritin  is 37 today.  AFP is normal and unchanged at 2.3.  Her most recent abdominal ultrasound on June 20, 2018 did not reveal any suspicious abnormalities in her liver.  No intervention is needed at this time.  Continue Exjade as prescribed.  Return to clinic in 6 months with repeat laboratory work and further evaluation. 2.  Port maintenance: Continue port flushes every 6 to 8 weeks. 3.  Spina bifida: Chronic and unchanged.   Patient expressed understanding and was in agreement with this plan. She also understands that She can call clinic at any time with any questions, concerns, or complaints.   Cancer Staging No matching staging information was found for the patient.  Lloyd Huger, MD   07/05/2018 10:13 AM

## 2018-07-03 ENCOUNTER — Inpatient Hospital Stay (HOSPITAL_BASED_OUTPATIENT_CLINIC_OR_DEPARTMENT_OTHER): Admitting: Oncology

## 2018-07-03 ENCOUNTER — Inpatient Hospital Stay: Attending: Oncology | Admitting: *Deleted

## 2018-07-03 ENCOUNTER — Other Ambulatory Visit: Payer: Self-pay

## 2018-07-03 DIAGNOSIS — Z95828 Presence of other vascular implants and grafts: Secondary | ICD-10-CM | POA: Diagnosis not present

## 2018-07-03 DIAGNOSIS — Q059 Spina bifida, unspecified: Secondary | ICD-10-CM | POA: Insufficient documentation

## 2018-07-03 LAB — CBC WITH DIFFERENTIAL/PLATELET
Abs Immature Granulocytes: 0.01 10*3/uL (ref 0.00–0.07)
Basophils Absolute: 0 10*3/uL (ref 0.0–0.1)
Basophils Relative: 1 %
Eosinophils Absolute: 0.1 10*3/uL (ref 0.0–0.5)
Eosinophils Relative: 2 %
HCT: 35.4 % — ABNORMAL LOW (ref 36.0–46.0)
Hemoglobin: 12.1 g/dL (ref 12.0–15.0)
Immature Granulocytes: 0 %
Lymphocytes Relative: 32 %
Lymphs Abs: 1.9 10*3/uL (ref 0.7–4.0)
MCH: 33.2 pg (ref 26.0–34.0)
MCHC: 34.2 g/dL (ref 30.0–36.0)
MCV: 97 fL (ref 80.0–100.0)
Monocytes Absolute: 0.6 10*3/uL (ref 0.1–1.0)
Monocytes Relative: 9 %
Neutro Abs: 3.5 10*3/uL (ref 1.7–7.7)
Neutrophils Relative %: 56 %
Platelets: 296 10*3/uL (ref 150–400)
RBC: 3.65 MIL/uL — ABNORMAL LOW (ref 3.87–5.11)
RDW: 12.2 % (ref 11.5–15.5)
WBC: 6.1 10*3/uL (ref 4.0–10.5)
nRBC: 0 % (ref 0.0–0.2)

## 2018-07-03 LAB — COMPREHENSIVE METABOLIC PANEL
ALT: 21 U/L (ref 0–44)
AST: 25 U/L (ref 15–41)
Albumin: 4.1 g/dL (ref 3.5–5.0)
Alkaline Phosphatase: 67 U/L (ref 38–126)
Anion gap: 7 (ref 5–15)
BUN: 13 mg/dL (ref 6–20)
CO2: 27 mmol/L (ref 22–32)
Calcium: 9 mg/dL (ref 8.9–10.3)
Chloride: 102 mmol/L (ref 98–111)
Creatinine, Ser: 0.52 mg/dL (ref 0.44–1.00)
GFR calc Af Amer: >60 mL/min (ref >60)
GFR calc non Af Amer: >60 mL/min (ref >60)
Glucose, Bld: 114 mg/dL — ABNORMAL HIGH (ref 70–99)
Potassium: 3.8 mmol/L (ref 3.5–5.1)
Sodium: 136 mmol/L (ref 135–145)
Total Bilirubin: 0.4 mg/dL (ref 0.3–1.2)
Total Protein: 7.8 g/dL (ref 6.5–8.1)

## 2018-07-03 LAB — FERRITIN: Ferritin: 37 ng/mL (ref 11–307)

## 2018-07-03 MED ORDER — HEPARIN SOD (PORK) LOCK FLUSH 100 UNIT/ML IV SOLN
500.0000 [IU] | Freq: Once | INTRAVENOUS | Status: AC
  Administered 2018-07-03: 500 [IU] via INTRAVENOUS

## 2018-07-03 MED ORDER — SODIUM CHLORIDE 0.9% FLUSH
10.0000 mL | Freq: Once | INTRAVENOUS | Status: AC
  Administered 2018-07-03: 10 mL via INTRAVENOUS
  Filled 2018-07-03: qty 10

## 2018-07-03 NOTE — Progress Notes (Signed)
Patient is here today to follow up on her Hereditary hemochromatosis. Patient stated that she had been doing well with no complaints.

## 2018-07-04 LAB — AFP TUMOR MARKER: AFP, Serum, Tumor Marker: 2.3 ng/mL (ref 0.0–8.3)

## 2018-07-13 ENCOUNTER — Other Ambulatory Visit: Payer: Self-pay

## 2018-07-13 MED ORDER — METOPROLOL TARTRATE 25 MG PO TABS: 25.0000 mg | ORAL_TABLET | Freq: Two times a day (BID) | ORAL | 2 refills | Status: DC

## 2018-07-18 ENCOUNTER — Ambulatory Visit: Payer: PRIVATE HEALTH INSURANCE

## 2018-07-26 ENCOUNTER — Encounter

## 2018-08-08 ENCOUNTER — Ambulatory Visit (INDEPENDENT_AMBULATORY_CARE_PROVIDER_SITE_OTHER): Payer: PRIVATE HEALTH INSURANCE

## 2018-08-08 ENCOUNTER — Ambulatory Visit: Payer: PRIVATE HEALTH INSURANCE

## 2018-08-08 ENCOUNTER — Other Ambulatory Visit: Payer: Self-pay

## 2018-08-08 DIAGNOSIS — G4733 Obstructive sleep apnea (adult) (pediatric): Secondary | ICD-10-CM

## 2018-08-08 NOTE — Progress Notes (Signed)
95 percentile pressure 8   95th percentile leak 0.41     apnea-hypopnea index  27.1 /hr   total days used  >4 hr 90 days  total days used <4 hr 0 days  Total compliance 100 percent  Would like to try auto cpap starting 6-12 cmH2o to see if pressure can help the AHI. I will speak to Dr. Humphrey Rolls or adam to approve change if this doesn't help I would suggest new sleep study.

## 2018-08-14 ENCOUNTER — Inpatient Hospital Stay: Attending: Oncology

## 2018-08-14 ENCOUNTER — Other Ambulatory Visit: Payer: Self-pay

## 2018-08-14 DIAGNOSIS — Z95828 Presence of other vascular implants and grafts: Secondary | ICD-10-CM

## 2018-08-14 DIAGNOSIS — Z452 Encounter for adjustment and management of vascular access device: Secondary | ICD-10-CM | POA: Diagnosis present

## 2018-08-14 MED ORDER — HEPARIN SOD (PORK) LOCK FLUSH 100 UNIT/ML IV SOLN
500.0000 [IU] | Freq: Once | INTRAVENOUS | Status: AC
  Administered 2018-08-14: 500 [IU] via INTRAVENOUS

## 2018-08-14 MED ORDER — SODIUM CHLORIDE 0.9% FLUSH
10.0000 mL | Freq: Once | INTRAVENOUS | Status: AC
  Administered 2018-08-14: 10 mL via INTRAVENOUS
  Filled 2018-08-14: qty 10

## 2018-09-03 ENCOUNTER — Encounter: Payer: Self-pay | Admitting: Adult Health

## 2018-09-03 ENCOUNTER — Other Ambulatory Visit: Payer: Self-pay

## 2018-09-03 ENCOUNTER — Ambulatory Visit (INDEPENDENT_AMBULATORY_CARE_PROVIDER_SITE_OTHER): Payer: PRIVATE HEALTH INSURANCE | Admitting: Adult Health

## 2018-09-03 VITALS — BP 138/92 | HR 78 | Resp 16 | Ht <= 58 in | Wt 120.0 lb

## 2018-09-03 DIAGNOSIS — G4733 Obstructive sleep apnea (adult) (pediatric): Secondary | ICD-10-CM | POA: Diagnosis not present

## 2018-09-03 DIAGNOSIS — Z9989 Dependence on other enabling machines and devices: Secondary | ICD-10-CM | POA: Diagnosis not present

## 2018-09-03 DIAGNOSIS — I10 Essential (primary) hypertension: Secondary | ICD-10-CM

## 2018-09-03 DIAGNOSIS — G40909 Epilepsy, unspecified, not intractable, without status epilepticus: Secondary | ICD-10-CM

## 2018-09-03 NOTE — Patient Instructions (Signed)
CPAP and BPAP Information  CPAP and BPAP are methods of helping a person breathe with the use of air pressure. CPAP stands for "continuous positive airway pressure." BPAP stands for "bi-level positive airway pressure." In both methods, air is blown through your nose or mouth and into your air passages to help you breathe well.  CPAP and BPAP use different amounts of pressure to blow air. With CPAP, the amount of pressure stays the same while you breathe in and out. With BPAP, the amount of pressure is increased when you breathe in (inhale) so that you can take larger breaths. Your health care provider will recommend whether CPAP or BPAP would be more helpful for you.  Why are CPAP and BPAP treatments used?  CPAP or BPAP can be helpful if you have:  · Sleep apnea.  · Chronic obstructive pulmonary disease (COPD).  · Heart failure.  · Medical conditions that weaken the muscles of the chest including muscular dystrophy, or neurological diseases such as amyotrophic lateral sclerosis (ALS).  · Other problems that cause breathing to be weak, abnormal, or difficult.  CPAP is most commonly used for obstructive sleep apnea (OSA) to keep the airways from collapsing when the muscles relax during sleep.  How is CPAP or BPAP administered?  Both CPAP and BPAP are provided by a small machine with a flexible plastic tube that attaches to a plastic mask. You wear the mask. Air is blown through the mask into your nose or mouth. The amount of pressure that is used to blow the air can be adjusted on the machine. Your health care provider will determine the pressure setting that should be used based on your individual needs.  When should CPAP or BPAP be used?  In most cases, the mask only needs to be worn during sleep. Generally, the mask needs to be worn throughout the night and during any daytime naps. People with certain medical conditions may also need to wear the mask at other times when they are awake. Follow instructions from your  health care provider about when to use the machine.  What are some tips for using the mask?    · Because the mask needs to be snug, some people feel trapped or closed-in (claustrophobic) when first using the mask. If you feel this way, you may need to get used to the mask. One way to do this is by holding the mask loosely over your nose or mouth and then gradually applying the mask more snugly. You can also gradually increase the amount of time that you use the mask.  · Masks are available in various types and sizes. Some fit over your mouth and nose while others fit over just your nose. If your mask does not fit well, talk with your health care provider about getting a different one.  · If you are using a mask that fits over your nose and you tend to breathe through your mouth, a chin strap may be applied to help keep your mouth closed.  · The CPAP and BPAP machines have alarms that may sound if the mask comes off or develops a leak.  · If you have trouble with the mask, it is very important that you talk with your health care provider about finding a way to make the mask easier to tolerate. Do not stop using the mask. Stopping the use of the mask could have a negative impact on your health.  What are some tips for using the machine?  ·   Place your CPAP or BPAP machine on a secure table or stand near an electrical outlet.  · Know where the on/off switch is located on the machine.  · Follow instructions from your health care provider about how to set the pressure on your machine and when you should use it.  · Do not eat or drink while the CPAP or BPAP machine is on. Food or fluids could get pushed into your lungs by the pressure of the CPAP or BPAP.  · Do not smoke. Tobacco smoke residue can damage the machine.  · For home use, CPAP and BPAP machines can be rented or purchased through home health care companies. Many different brands of machines are available. Renting a machine before purchasing may help you find out  which particular machine works well for you.  · Keep the CPAP or BPAP machine and attachments clean. Ask your health care provider for specific instructions.  Get help right away if:  · You have redness or open areas around your nose or mouth where the mask fits.  · You have trouble using the CPAP or BPAP machine.  · You cannot tolerate wearing the CPAP or BPAP mask.  · You have pain, discomfort, and bloating in your abdomen.  Summary  · CPAP and BPAP are methods of helping a person breathe with the use of air pressure.  · Both CPAP and BPAP are provided by a small machine with a flexible plastic tube that attaches to a plastic mask.  · If you have trouble with the mask, it is very important that you talk with your health care provider about finding a way to make the mask easier to tolerate.  This information is not intended to replace advice given to you by your health care provider. Make sure you discuss any questions you have with your health care provider.  Document Released: 01/22/2004 Document Revised: 12/26/2017 Document Reviewed: 03/14/2016  Elsevier Interactive Patient Education © 2019 Elsevier Inc[employer]

## 2018-09-03 NOTE — Progress Notes (Signed)
[facility] [place], [place] [address]  Pulmonary Sleep Medicine   Office Visit Note  Patient Name: Christina Stephens DOB: May 20, 1975 MRN [id number]  Date of Service: 09/03/2018  Complaints/HPI: Pt is here for pulmonary follow up. Her last CPAP download shows 90 days greater than 4 hours.  She denies any issues with her mask or seal.  She has been cleaning her machine and replacing the tubing and mask seal as directed.  She reports relief of symptoms and denies daytime fatigue, needing to nap, falling asleep while driving, hemoptysis or sinus issues.   ROS  General: (-) fever, (-) chills, (-) night sweats, (-) weakness Skin: (-) rashes, (-) itching,. Eyes: (-) visual changes, (-) redness, (-) itching. Nose and Sinuses: (-) nasal stuffiness or itchiness, (-) postnasal drip, (-) nosebleeds, (-) sinus trouble. Mouth and Throat: (-) sore throat, (-) hoarseness. Neck: (-) swollen glands, (-) enlarged thyroid, (-) neck pain. Respiratory: - cough, (-) bloody sputum, - shortness of breath, - wheezing. Cardiovascular: - ankle swelling, (-) chest pain. Lymphatic: (-) lymph node enlargement. Neurologic: (-) numbness, (-) tingling. Psychiatric: (-) anxiety, (-) depression   Current Medication: Outpatient Encounter Medications as of 09/03/2018  Medication Sig Note  . CALCIUM-VITAMIN D PO Take 1 tablet by mouth daily.   Marland Kitchen deferasirox (EXJADE) 250 MG disintegrating tablet Take 1 tablet (250 mg total) by mouth daily before breakfast.   . levothyroxine (SYNTHROID, LEVOTHROID) 50 MCG tablet Take 1 tablet (50 mcg total) by mouth daily.   . metoprolol tartrate (LOPRESSOR) 25 MG tablet Take 1 tablet (25 mg total) by mouth 2 (two) times daily.   . phenytoin (DILANTIN) 100 MG ER capsule TAKE 2 CAPSULES (200 MG TOTAL) BY MOUTH 2 (TWO) TIMES DAILY. 01/02/2015: Received from: External Pharmacy  . polyethylene glycol (MIRALAX / GLYCOLAX) packet 1 POWDER PACK IN 12 OZ WATER DAILY FOR  CONSTIPATION   . traMADol (ULTRAM) 50 MG tablet Take 1 tablet (50 mg total) by mouth every 6 (six) hours as needed for moderate pain.    Facility-Administered Encounter Medications as of 09/03/2018  Medication  . sodium chloride flush (NS) 0.9 % injection 10 mL  . sodium chloride flush (NS) 0.9 % injection 10 mL    Surgical History: Past Surgical History:  Procedure Laterality Date  . REDUCTION MAMMAPLASTY Bilateral 1993  . VP STUNT HEAD SURGERYOther]  2000    Medical History: Past Medical History:  Diagnosis Date  . CPAP (continuous positive airway pressure) dependence   . Hereditary hemochromatosis ([place])   . Hypertension   . Seizures ([place])   . Sleep apnea   . Spina bifida ([place])   . Thyroid disease    hypothyroidism    Family History: Family History  Problem Relation Age of Onset  . Ovarian cancer Maternal Aunt   . Hypertension Father   . CAD Father   . Arthritis Sister   . CAD Paternal Grandfather   . Breast cancer Maternal Grandmother     Social History: Social History   Socioeconomic History  . Marital status: Single    Spouse name: Not on file  . Number of children: Not on file  . Years of education: Not on file  . Highest education level: Not on file  Occupational History  . Not on file  Social Needs  . Financial resource strain: Not on file  . Food insecurity:    Worry: Not on file    Inability: Not on file  . Transportation needs:  Medical: Not on file    Non-medical: Not on file  Tobacco Use  . Smoking status: Never Smoker  . Smokeless tobacco: Never Used  Substance and Sexual Activity  . Alcohol use: No  . Drug use: No  . Sexual activity: Not on file  Lifestyle  . Physical activity:    Days per week: Not on file    Minutes per session: Not on file  . Stress: Not on file  Relationships  . Social connections:    Talks on phone: Not on file    Gets together: Not on file    Attends religious service: Not on file    Active member of  club or organization: Not on file    Attends meetings of clubs or organizations: Not on file    Relationship status: Not on file  . Intimate partner violence:    Fear of current or ex partner: Not on file    Emotionally abused: Not on file    Physically abused: Not on file    Forced sexual activity: Not on file  Other Topics Concern  . Not on file  Social History Narrative  . Not on file    Vital Signs: Blood pressure (!) 138/92, pulse 78, resp. rate 16, height 4' 7"  (1.397 m), weight 120 lb (54.4 kg), SpO2 98 %.  Examination: General Appearance: The patient is well-developed, well-nourished, and in no distress. Skin: Gross inspection of skin unremarkable. Head: normocephalic, no gross deformities. Eyes: no gross deformities noted. ENT: ears appear grossly normal no exudates. Neck: Supple. No thyromegaly. No LAD. Respiratory: clear bilateraly. Cardiovascular: Normal S1 and S2 without murmur or rub. Extremities: No cyanosis. pulses are equal. Neurologic: Alert and oriented. No involuntary movements.  LABS: Recent Results (from the past 2160 hour(s))  AFP tumor marker     Status: None   Collection Time: 07/03/18  9:44 AM  Result Value Ref Range   AFP, Serum, Tumor Marker 2.3 0.0 - 8.3 ng/mL    Comment: (NOTE) Roche Diagnostics Electrochemiluminescence Immunoassay (ECLIA) Values obtained with different assay methods or kits cannot be used interchangeably.  Results cannot be interpreted as absolute evidence of the presence or absence of malignant disease. This test is not interpretable in pregnant females. Performed At: [facility] [place], [place] [id number] Rush Farmer MD WU:[id number]   Ferritin     Status: None   Collection Time: 07/03/18  9:44 AM  Result Value Ref Range   Ferritin 37 11 - 307 ng/mL    Comment: Performed at [facility], [address] [place], [place] [address]  Comprehensive metabolic panel     Status: Abnormal    Collection Time: 07/03/18  9:44 AM  Result Value Ref Range   Sodium 136 135 - 145 mmol/L   Potassium 3.8 3.5 - 5.1 mmol/L   Chloride 102 98 - 111 mmol/L   CO2 27 22 - 32 mmol/L   Glucose, Bld 114 (H) 70 - 99 mg/dL   BUN 13 6 - 20 mg/dL   Creatinine, Ser 0.52 0.44 - 1.00 mg/dL   Calcium 9.0 8.9 - 10.3 mg/dL   Total Protein 7.8 6.5 - 8.1 g/dL   Albumin 4.1 3.5 - 5.0 g/dL   AST 25 15 - 41 U/L   ALT 21 0 - 44 U/L   Alkaline Phosphatase 67 38 - 126 U/L   Total Bilirubin 0.4 0.3 - 1.2 mg/dL   GFR calc non Af Amer >60 >60 mL/min  GFR calc Af Amer >60 >60 mL/min   Anion gap 7 5 - 15    Comment: Performed at [facility], [address] , [place] [address]  CBC with Differential     Status: Abnormal   Collection Time: 07/03/18  9:44 AM  Result Value Ref Range   WBC 6.1 4.0 - 10.5 K/uL   RBC 3.65 (L) 3.87 - 5.11 MIL/uL   Hemoglobin 12.1 12.0 - 15.0 g/dL   HCT 35.4 (L) 36.0 - 46.0 %   MCV 97.0 80.0 - 100.0 fL   MCH 33.2 26.0 - 34.0 pg   MCHC 34.2 30.0 - 36.0 g/dL   RDW 12.2 11.5 - 15.5 %   Platelets 296 150 - 400 K/uL   nRBC 0.0 0.0 - 0.2 %   Neutrophils Relative % 56 %   Neutro Abs 3.5 1.7 - 7.7 K/uL   Lymphocytes Relative 32 %   Lymphs Abs 1.9 0.7 - 4.0 K/uL   Monocytes Relative 9 %   Monocytes Absolute 0.6 0.1 - 1.0 K/uL   Eosinophils Relative 2 %   Eosinophils Absolute 0.1 0.0 - 0.5 K/uL   Basophils Relative 1 %   Basophils Absolute 0.0 0.0 - 0.1 K/uL   Immature Granulocytes 0 %   Abs Immature Granulocytes 0.01 0.00 - 0.07 K/uL    Comment: Performed at [facility], [address] [place], [place] [address]    Radiology: US Abdomen Limited Ruq  Result Date: 06/20/2018 CLINICAL DATA:  Hemochromatosis. EXAM: ULTRASOUND ABDOMEN LIMITED RIGHT UPPER QUADRANT COMPARISON:  Ultrasound of December 12, 2017. FINDINGS: Gallbladder: Cholelithiasis is noted with largest gallstone measuring 1 cm. No gallbladder wall thickening or pericholecystic fluid is noted.  No sonographic Murphy's sign is noted. Common bile duct: Diameter: 2.5 mm which is within normal limits. Liver: No focal lesion identified. Within normal limits in parenchymal echogenicity. Portal vein is patent on color Doppler imaging with normal direction of blood flow towards the liver. IMPRESSION: Cholelithiasis without evidence of cholecystitis. No other abnormality seen in the right upper quadrant of the abdomen. Electronically Signed   By: Marijo Conception, M.D.   On: 06/20/2018 14:33    No results found.  No results found.    Assessment and Plan: Patient Active Problem List   Diagnosis Date Noted  . Acquired hypothyroidism 06/12/2018  . Seizure disorder ([place]) 06/12/2018  . Screening for breast cancer 06/12/2018  . Needs flu shot 02/04/2018  . Essential hypertension 05/31/2017  . Sleep apnea 05/31/2017  . Spina bifida ([place]) 05/31/2017  . Hemochromatosis 11/28/2014   1. OSA on CPAP Stable, good compliance.  Continue therapy.   2. Essential hypertension bp rechecked. 126/86  Continue present management.  3. Seizure disorder ([place]) Stable, continue present management.   General Counseling: I have discussed the findings of the evaluation and examination with Loye.  I have also discussed any further diagnostic evaluation thatmay be needed or ordered today. Kimble verbalizes understanding of the findings of todays visit. We also reviewed her medications today and discussed drug interactions and side effects including but not limited excessive drowsiness and altered mental states. We also discussed that there is always a risk not just to her but also people around her. she has been encouraged to call the office with any questions or concerns that should arise related to todays visit.    Time spent: 25 This patient was seen by Orson Gear AGNP-C in Collaboration with Dr. Devona Konig as a part of  collaborative care agreement.   I have personally obtained a history, examined the  patient, evaluated laboratory and imaging results, formulated the assessment and plan and placed orders.    Allyne Gee, MD [facility] Pulmonary and Critical Care Sleep medicine

## 2018-09-10 ENCOUNTER — Ambulatory Visit
Admission: RE | Admit: 2018-09-10 | Discharge: 2018-09-10 | Disposition: A | Discharge status: Home / Self Care | Source: Ambulatory Visit | Attending: Nurse Practitioner | Admitting: Nurse Practitioner

## 2018-09-10 ENCOUNTER — Other Ambulatory Visit: Payer: Self-pay

## 2018-09-10 ENCOUNTER — Encounter

## 2018-09-10 DIAGNOSIS — Z1239 Encounter for other screening for malignant neoplasm of breast: Secondary | ICD-10-CM

## 2018-09-10 DIAGNOSIS — Z1231 Encounter for screening mammogram for malignant neoplasm of breast: Secondary | ICD-10-CM | POA: Insufficient documentation

## 2018-09-12 ENCOUNTER — Ambulatory Visit: Payer: PRIVATE HEALTH INSURANCE

## 2018-09-13 ENCOUNTER — Ambulatory Visit (INDEPENDENT_AMBULATORY_CARE_PROVIDER_SITE_OTHER): Payer: PRIVATE HEALTH INSURANCE | Admitting: Nurse Practitioner

## 2018-09-13 ENCOUNTER — Encounter: Payer: Self-pay | Admitting: Nurse Practitioner

## 2018-09-13 ENCOUNTER — Other Ambulatory Visit: Payer: Self-pay

## 2018-09-13 VITALS — BP 143/80 | HR 83 | Resp 16 | Ht <= 58 in | Wt 120.0 lb

## 2018-09-13 DIAGNOSIS — G40909 Epilepsy, unspecified, not intractable, without status epilepticus: Secondary | ICD-10-CM | POA: Diagnosis not present

## 2018-09-13 DIAGNOSIS — I10 Essential (primary) hypertension: Secondary | ICD-10-CM

## 2018-09-13 DIAGNOSIS — E039 Hypothyroidism, unspecified: Secondary | ICD-10-CM | POA: Diagnosis not present

## 2018-09-13 DIAGNOSIS — Q059 Spina bifida, unspecified: Secondary | ICD-10-CM | POA: Diagnosis not present

## 2018-09-13 DIAGNOSIS — Z124 Encounter for screening for malignant neoplasm of cervix: Secondary | ICD-10-CM

## 2018-09-13 DIAGNOSIS — Z0001 Encounter for general adult medical examination with abnormal findings: Secondary | ICD-10-CM | POA: Diagnosis not present

## 2018-09-13 DIAGNOSIS — R300 Dysuria: Secondary | ICD-10-CM

## 2018-09-13 MED ORDER — LEVOTHYROXINE SODIUM 50 MCG PO TABS: 50.0000 ug | ORAL_TABLET | Freq: Every day | ORAL | 3 refills | Status: DC

## 2018-09-13 NOTE — Progress Notes (Signed)
Pt blood pressure elevated, informed provider, pt mentioned that her uncle passed away this morning.

## 2018-09-13 NOTE — Progress Notes (Signed)
[facility] [place], [place] [address]  Internal MEDICINE  Office Visit Note  Patient Name: Christina Stephens  [id number]  [id number]  Date of Service: 10/01/2018   Pt is here for routine health maintenance examination  Chief Complaint  Patient presents with  . Medicare Wellness  . Hypertension  . Sleep Apnea  . Hypothyroidism  . Gynecologic Exam     The patient is here for health maintenance exam and pap smear today. She is due to have routine, fasting labs drawn. She had screening mammogram 09/10/2018 and it was negative. She has no concerns or complaints today.     Current Medication: Outpatient Encounter Medications as of 09/13/2018  Medication Sig Note  . CALCIUM-VITAMIN D PO Take 1 tablet by mouth daily.   Marland Kitchen deferasirox (EXJADE) 250 MG disintegrating tablet Take 1 tablet (250 mg total) by mouth daily before breakfast.   . levothyroxine (SYNTHROID) 50 MCG tablet Take 1 tablet (50 mcg total) by mouth daily.   . metoprolol tartrate (LOPRESSOR) 25 MG tablet Take 1 tablet (25 mg total) by mouth 2 (two) times daily.   . phenytoin (DILANTIN) 100 MG ER capsule TAKE 2 CAPSULES (200 MG TOTAL) BY MOUTH 2 (TWO) TIMES DAILY. 01/02/2015: Received from: External Pharmacy  . phenytoin (DILANTIN) 100 MG ER capsule Take 1 capsule by mouth 2 (two) times a day.   . polyethylene glycol (MIRALAX / GLYCOLAX) packet 1 POWDER PACK IN 12 OZ WATER DAILY FOR CONSTIPATION   . traMADol (ULTRAM) 50 MG tablet Take 1 tablet (50 mg total) by mouth every 6 (six) hours as needed for moderate pain.   Marland Kitchen VITAMIN C, CALCIUM ASCORBATE, PO Take 500 mg by mouth.   . [DISCONTINUED] levothyroxine (SYNTHROID, LEVOTHROID) 50 MCG tablet Take 1 tablet (50 mcg total) by mouth daily.    Facility-Administered Encounter Medications as of 09/13/2018  Medication  . sodium chloride flush (NS) 0.9 % injection 10 mL  . sodium chloride flush (NS) 0.9 % injection 10 mL    Surgical History: Past Surgical  History:  Procedure Laterality Date  . REDUCTION MAMMAPLASTY Bilateral 1993  . VP STUNT HEAD SURGERYOther]  2000    Medical History: Past Medical History:  Diagnosis Date  . CPAP (continuous positive airway pressure) dependence   . Hereditary hemochromatosis ([place])   . Hypertension   . Seizures ([place])   . Sleep apnea   . Spina bifida ([place])   . Thyroid disease    hypothyroidism    Family History: Family History  Problem Relation Age of Onset  . Ovarian cancer Maternal Aunt   . Hypertension Father   . CAD Father   . Arthritis Sister   . CAD Paternal Grandfather   . Breast cancer Maternal Grandmother       Review of Systems  Constitutional: Negative for activity change, chills, fatigue and unexpected weight change.  HENT: Negative for congestion, postnasal drip, rhinorrhea, sneezing and sore throat.   Respiratory: Negative for apnea, cough, chest tightness, shortness of breath and wheezing.   Cardiovascular: Negative for chest pain and palpitations.  Gastrointestinal: Negative for abdominal pain, constipation, diarrhea, nausea and vomiting.  Endocrine: Negative for cold intolerance, heat intolerance, polydipsia and polyuria.       Stable hypothyroid  Musculoskeletal: Positive for arthralgias and gait problem. Negative for back pain, joint swelling and neck pain.  Skin: Negative for rash.  Allergic/Immunologic: Negative.  Negative for environmental allergies.  Neurological: Positive for seizures. Negative for tremors and numbness.  Hematological: Negative for adenopathy. Does not bruise/bleed easily.  Psychiatric/Behavioral: Negative for behavioral problems (Depression), sleep disturbance and suicidal ideas. The patient is not nervous/anxious.      Today's Vitals   09/13/18 1154  BP: (!) 143/80  Pulse: 83  Resp: 16  SpO2: 99%  Weight: 120 lb (54.4 kg)  Height: 4' 7"  (1.397 m)   Body mass index is 27.89 kg/m.  Physical Exam Vitals signs and nursing note  reviewed.  Constitutional:      General: She is not in acute distress.    Appearance: Normal appearance. She is well-developed. She is not diaphoretic.  HENT:     Head: Normocephalic and atraumatic.     Nose: Nose normal.     Mouth/Throat:     Pharynx: No oropharyngeal exudate.  Eyes:     Conjunctiva/sclera: Conjunctivae normal.     Pupils: Pupils are equal, round, and reactive to light.  Neck:     Musculoskeletal: Normal range of motion and neck supple. No muscular tenderness.     Thyroid: No thyromegaly.     Vascular: No JVD.     Trachea: No tracheal deviation.  Cardiovascular:     Rate and Rhythm: Normal rate and regular rhythm.     Pulses: Normal pulses.     Heart sounds: Normal heart sounds. No murmur. No friction rub. No gallop.   Pulmonary:     Effort: Pulmonary effort is normal. No respiratory distress.     Breath sounds: Normal breath sounds. No wheezing or rales.  Chest:     Chest wall: No tenderness.     Breasts:        Right: Normal. No swelling, bleeding, inverted nipple, mass, nipple discharge, skin change or tenderness.        Left: No swelling, bleeding, inverted nipple, mass, nipple discharge, skin change or tenderness.  Abdominal:     General: Bowel sounds are normal.     Palpations: Abdomen is soft.     Tenderness: There is no abdominal tenderness.     Hernia: There is no hernia in the right inguinal area or left inguinal area.  Genitourinary:    General: Normal vulva.     Exam position: Supine.     Labia:        Right: No tenderness, lesion or injury.        Left: No tenderness, lesion or injury.      Vagina: Normal. No vaginal discharge, erythema or tenderness.     Cervix: No cervical motion tenderness, discharge or erythema.     Uterus: Normal.      Adnexa: Right adnexa normal and left adnexa normal.     Comments: No tenderness, masses, or organomeglay present during bimanual exam . Musculoskeletal: Normal range of motion.  Lymphadenopathy:      Cervical: No cervical adenopathy.     Lower Body: No right inguinal adenopathy. No left inguinal adenopathy.  Skin:    General: Skin is warm and dry.  Neurological:     Mental Status: She is alert and oriented to person, place, and time. Mental status is at baseline.     Cranial Nerves: No cranial nerve deficit.     Comments: Patient is at her neurological baseline.   Psychiatric:        Behavior: Behavior normal.        Thought Content: Thought content normal.        Judgment: Judgment normal.    Depression screen [facility] 2/9 09/13/2018 06/04/2018 01/26/2018  05/30/2017  Decreased Interest 0 0 0 0  Down, Depressed, Hopeless 0 0 0 0  PHQ - 2 Score 0 0 0 0    Functional Status Survey: Is the patient deaf or have difficulty hearing?: No Does the patient have difficulty seeing, even when wearing glasses/contacts?: No Does the patient have difficulty concentrating, remembering, or making decisions?: No Does the patient have difficulty walking or climbing stairs?: Yes Does the patient have difficulty dressing or bathing?: No Does the patient have difficulty doing errands alone such as visiting a doctor's office or shopping?: No  MMSE - [place] Exam 09/13/2018  Orientation to time 5  Orientation to Place 5  Registration 3  Attention/ Calculation 5  Recall 3  Language- name 2 objects 2  Language- repeat 1  Language- follow 3 step command 3  Language- read & follow direction 1  Write a sentence 1  Copy design 1  Total score 30    Fall Risk  09/13/2018 06/04/2018 01/26/2018 05/30/2017  Falls in the past year? 0 - No No  Number falls in past yr: - 0 - -  Injury with Fall? - 0 - -      LABS: Recent Results (from the past 2160 hour(s))  UA/M w/rflx Culture, Routine     Status: Abnormal   Collection Time: 09/13/18 11:40 AM  Result Value Ref Range   Specific Gravity, UA 1.011 1.005 - 1.030   pH, UA 7.5 5.0 - 7.5   Color, UA Yellow Yellow   Appearance Ur Turbid (A) Clear    Leukocytes,UA 3+ (A) Negative   Protein,UA Trace Negative/Trace   Glucose, UA Negative Negative   Ketones, UA Negative Negative   RBC, UA 2+ (A) Negative   Bilirubin, UA Negative Negative   Urobilinogen, Ur 0.2 0.2 - 1.0 mg/dL   Nitrite, UA Negative Negative   Microscopic Examination See below:     Comment: Microscopic was indicated and was performed.   Urinalysis Reflex Comment     Comment: This specimen has reflexed to a Urine Culture.  Microscopic Examination     Status: Abnormal   Collection Time: 09/13/18 11:40 AM  Result Value Ref Range   WBC, UA >30 (A) 0 - 5 /hpf   RBC 0-2 0 - 2 /hpf   Epithelial Cells (non renal) 0-10 0 - 10 /hpf   Casts None seen None seen /lpf   Mucus, UA Present Not Estab.   Bacteria, UA Many (A) None seen/Few  Urine Culture, Reflex     Status: None   Collection Time: 09/13/18 11:40 AM  Result Value Ref Range   Urine Culture, Routine Final report    Organism ID, Bacteria Comment     Comment: Mixed urogenital flora 50,000-100,000 colony forming units per mL   Pap IG and HPV (high risk) DNA detection     Status: None   Collection Time: 09/13/18 12:05 PM  Result Value Ref Range   Interpretation NILM     Comment: NEGATIVE FOR INTRAEPITHELIAL LESION OR MALIGNANCY.   Category NIL     Comment: Negative for Intraepithelial Lesion   Adequacy ENDO     Comment: Satisfactory for evaluation. Endocervical and/or squamous metaplastic cells (endocervical component) are present.    Clinician Provided ICD10 Comment     Comment: Z12.4   Performed by: Comment     Comment: Kitty Lindenthal, Cytotechnologist (ASCP)   Note: Comment     Comment: The Pap smear is a screening test designed to aid  in the detection of premalignant and malignant conditions of the uterine cervix.  It is not a diagnostic procedure and should not be used as the sole means of detecting cervical cancer.  Both false-positive and false-negative reports do occur.    Test Methodology Comment      Comment: This liquid based ThinPrep(R) pap test was screened with the use of an image guided system.    HPV, high-risk Negative Negative    Comment: This nucleic acid amplification high-risk HPV test detects thirteen high-risk types (16,18,31,33,35,39,45,51,52,56,58,59,68) without differentiation.     Assessment/Plan: 1. Encounter for general adult medical examination with abnormal findings Annual health maintenance exam today   2. Acquired hypothyroidism Will check thyroid panel and adjust levothyroxine as indicated.  - levothyroxine (SYNTHROID) 50 MCG tablet; Take 1 tablet (50 mcg total) by mouth daily.  Dispense: 90 tablet; Refill: 3  3. Essential hypertension Stable. Continue bp medication as prescribed   4. Spina bifida, unspecified hydrocephalus presence, unspecified spinal region ([place]) Stable.   5. Seizure disorder ([place]) Stable.   6. Routine cervical smear - Pap IG and HPV (high risk) DNA detection  7. Dysuria - UA/M w/rflx Culture, Routine  General Counseling: Wandalee verbalizes understanding of the findings of todays visit and agrees with plan of treatment. I have discussed any further diagnostic evaluation that may be needed or ordered today. We also reviewed her medications today. she has been encouraged to call the office with any questions or concerns that should arise related to todays visit.    Counseling:  This patient was seen by Leretha Pol FNP Collaboration with Dr [name] as a part of collaborative care agreement  Orders Placed This Encounter  Procedures  . Microscopic Examination  . Urine Culture, Reflex  . UA/M w/rflx Culture, Routine     Time spent: [place], MD  Internal Medicine

## 2018-09-15 LAB — MICROSCOPIC EXAMINATION
Casts: NONE SEEN /lpf
WBC, UA: >30 /hpf — AB (ref 0–5)

## 2018-09-15 LAB — UA/M W/RFLX CULTURE, ROUTINE
Bilirubin, UA: NEGATIVE
Glucose, UA: NEGATIVE
Ketones, UA: NEGATIVE
Nitrite, UA: NEGATIVE
Specific Gravity, UA: 1.011 (ref 1.005–1.030)
Urobilinogen, Ur: 0.2 mg/dL (ref 0.2–1.0)
pH, UA: 7.5 (ref 5.0–7.5)

## 2018-09-15 LAB — URINE CULTURE, REFLEX

## 2018-09-21 LAB — PAP IG AND HPV HIGH-RISK: HPV, high-risk: NEGATIVE

## 2018-09-25 ENCOUNTER — Other Ambulatory Visit: Payer: Self-pay

## 2018-09-25 ENCOUNTER — Inpatient Hospital Stay: Attending: Oncology

## 2018-09-25 DIAGNOSIS — Z95828 Presence of other vascular implants and grafts: Secondary | ICD-10-CM

## 2018-09-25 DIAGNOSIS — Z452 Encounter for adjustment and management of vascular access device: Secondary | ICD-10-CM | POA: Diagnosis present

## 2018-09-25 MED ORDER — HEPARIN SOD (PORK) LOCK FLUSH 100 UNIT/ML IV SOLN
500.0000 [IU] | Freq: Once | INTRAVENOUS | Status: AC
  Administered 2018-09-25: 500 [IU] via INTRAVENOUS

## 2018-09-25 MED ORDER — SODIUM CHLORIDE 0.9% FLUSH
10.0000 mL | Freq: Once | INTRAVENOUS | Status: AC
  Administered 2018-09-25: 10 mL via INTRAVENOUS
  Filled 2018-09-25: qty 10

## 2018-10-01 DIAGNOSIS — R300 Dysuria: Secondary | ICD-10-CM | POA: Insufficient documentation

## 2018-10-04 ENCOUNTER — Telehealth: Payer: Self-pay

## 2018-10-04 NOTE — Telephone Encounter (Signed)
Called pt to ask if pt had any symptoms of an UTI, but pt phone goes straight to a message saying a vm box is not set up.

## 2018-11-06 ENCOUNTER — Other Ambulatory Visit: Payer: Self-pay | Admitting: Nurse Practitioner

## 2018-11-06 ENCOUNTER — Other Ambulatory Visit: Payer: Self-pay

## 2018-11-06 ENCOUNTER — Inpatient Hospital Stay: Attending: Oncology

## 2018-11-06 DIAGNOSIS — Z452 Encounter for adjustment and management of vascular access device: Secondary | ICD-10-CM | POA: Insufficient documentation

## 2018-11-06 DIAGNOSIS — Z95828 Presence of other vascular implants and grafts: Secondary | ICD-10-CM

## 2018-11-06 MED ORDER — SODIUM CHLORIDE 0.9% FLUSH
10.0000 mL | Freq: Once | INTRAVENOUS | Status: AC
  Administered 2018-11-06: 10 mL via INTRAVENOUS
  Filled 2018-11-06: qty 10

## 2018-11-06 MED ORDER — HEPARIN SOD (PORK) LOCK FLUSH 100 UNIT/ML IV SOLN
500.0000 [IU] | Freq: Once | INTRAVENOUS | Status: AC
  Administered 2018-11-06: 10:00:00 500 [IU] via INTRAVENOUS

## 2018-11-06 MED ORDER — METOPROLOL TARTRATE 25 MG PO TABS: 25.0000 mg | ORAL_TABLET | Freq: Two times a day (BID) | ORAL | 2 refills | Status: DC

## 2018-11-07 ENCOUNTER — Ambulatory Visit: Payer: PRIVATE HEALTH INSURANCE

## 2018-12-12 ENCOUNTER — Ambulatory Visit (INDEPENDENT_AMBULATORY_CARE_PROVIDER_SITE_OTHER): Payer: PRIVATE HEALTH INSURANCE

## 2018-12-12 ENCOUNTER — Other Ambulatory Visit: Payer: Self-pay

## 2018-12-12 DIAGNOSIS — G4733 Obstructive sleep apnea (adult) (pediatric): Secondary | ICD-10-CM

## 2018-12-12 NOTE — Progress Notes (Signed)
95 percentile pressure 11.8    95th percentile leak 0.24   apnea- hypopnea index 12.4 /hr    total days used  >4 hr 90 days  total days used <4 hr 0 days  Total compliance 100 percent  She is doing some better still high AHI and maxing out pressure would like to do an auto 6-14 cwp and will check reading.

## 2018-12-31 NOTE — Progress Notes (Signed)
[facility]  Telephone:([phone]) [phone] Fax:(336) [phone]  ID: Christina Stephens OB: [birth year]  MR#: [id number]  GYI#:[id number]  Patient Care Team: [name], MD as PCP - General (Internal Medicine)  CHIEF COMPLAINT: Hereditary hemochromatosis.  INTERVAL HISTORY: Patient returns to clinic today for repeat laboratory work and further evaluation.  She continues to tolerate Exjade well without significant side effects.  She currently feels well and is at her baseline. She has no neurologic complaints.  She denies any recent fevers or illnesses.  She has a good appetite and denies weight loss.  She denies any chest pain, shortness of breath, cough, or hemoptysis.  She denies any nausea, vomiting, constipation, or diarrhea.  She has no urinary complaints.  Patient offers no specific complaints today.  REVIEW OF SYSTEMS:   Review of Systems  Constitutional: Negative.  Negative for fever, malaise/fatigue and weight loss.  Respiratory: Negative.  Negative for cough, hemoptysis and shortness of breath.   Cardiovascular: Negative.  Negative for chest pain and leg swelling.  Gastrointestinal: Negative.  Negative for abdominal pain.  Genitourinary: Negative.  Negative for dysuria.  Musculoskeletal: Negative.  Negative for back pain.  Skin: Negative.  Negative for rash.  Neurological: Negative.  Negative for dizziness, focal weakness and weakness.  Psychiatric/Behavioral: Negative.  The patient is not nervous/anxious.     As per HPI. Otherwise, a complete review of systems is negative.  PAST MEDICAL HISTORY: Past Medical History:  Diagnosis Date  . CPAP (continuous positive airway pressure) dependence   . Hereditary hemochromatosis ([place])   . Hypertension   . Seizures ([place])   . Sleep apnea   . Spina bifida ([place])   . Thyroid disease    hypothyroidism    PAST SURGICAL HISTORY: Past Surgical History:  Procedure Laterality Date  . REDUCTION MAMMAPLASTY Bilateral 1993   . VP STUNT HEAD SURGERYOther]  2000    FAMILY HISTORY: Family History  Problem Relation Age of Onset  . Ovarian cancer Maternal Aunt   . Hypertension Father   . CAD Father   . Arthritis Sister   . CAD Paternal Grandfather   . Breast cancer Maternal Grandmother     ADVANCED DIRECTIVES (Y/N):  N  HEALTH MAINTENANCE: Social History   Tobacco Use  . Smoking status: Never Smoker  . Smokeless tobacco: Never Used  Substance Use Topics  . Alcohol use: No  . Drug use: No     Colonoscopy:  PAP:  Bone density:  Lipid panel:  Allergies  Allergen Reactions  . Latex     Pt has Spina bifada and was told that if she has surgery she should avoid latex for poss. Reaction/infection.    Current Outpatient Medications  Medication Sig Dispense Refill  . CALCIUM-VITAMIN D PO Take 1 tablet by mouth daily.    Marland Kitchen deferasirox (EXJADE) 250 MG disintegrating tablet Take 1 tablet (250 mg total) by mouth daily before breakfast. 30 tablet 11  . levothyroxine (SYNTHROID) 50 MCG tablet Take 1 tablet (50 mcg total) by mouth daily. 90 tablet 3  . metoprolol tartrate (LOPRESSOR) 25 MG tablet Take 1 tablet (25 mg total) by mouth 2 (two) times daily. 60 tablet 2  . phenytoin (DILANTIN) 100 MG ER capsule TAKE 2 CAPSULES (200 MG TOTAL) BY MOUTH 2 (TWO) TIMES DAILY.  11  . polyethylene glycol (MIRALAX / GLYCOLAX) packet 1 POWDER PACK IN 12 OZ WATER DAILY FOR CONSTIPATION    . traMADol (ULTRAM) 50 MG tablet Take 1 tablet (50 mg  total) by mouth every 6 (six) hours as needed for moderate pain. 12 tablet 0   No current facility-administered medications for this visit.    Facility-Administered Medications Ordered in Other Visits  Medication Dose Route Frequency Provider Last Rate Last Dose  . sodium chloride flush (NS) 0.9 % injection 10 mL  10 mL Intravenous PRN Cammie Sickle, MD   10 mL at 02/26/16 0917  . sodium chloride flush (NS) 0.9 % injection 10 mL  10 mL Intravenous PRN Nolon Stalls C, MD    10 mL at 01/01/18 1108    OBJECTIVE: Vitals:   01/03/19 1425  BP: 133/79  Pulse: (!) 106  Resp: 18     Body mass index is 28.61 kg/m.    ECOG FS:0 - Asymptomatic  General: Well-developed, well-nourished, no acute distress. Eyes: Pink conjunctiva, anicteric sclera. HEENT: Normocephalic, moist mucous membranes. Lungs: Clear to auscultation bilaterally. Heart: Regular rate and rhythm. No rubs, murmurs, or gallops. Abdomen: Soft, nontender, nondistended. No organomegaly noted, normoactive bowel sounds. Musculoskeletal: No edema, cyanosis, or clubbing.  Braces on bilateral legs. Neuro: Alert, answering all questions appropriately. Cranial nerves grossly intact. Skin: No rashes or petechiae noted. Psych: Normal affect.  LAB RESULTS:  Lab Results  Component Value Date   NA 137 01/03/2019   K 3.8 01/03/2019   CL 104 01/03/2019   CO2 28 01/03/2019   GLUCOSE 102 (H) 01/03/2019   BUN 15 01/03/2019   CREATININE 0.53 01/03/2019   CALCIUM 9.1 01/03/2019   PROT 7.8 01/03/2019   ALBUMIN 4.2 01/03/2019   AST 19 01/03/2019   ALT 19 01/03/2019   ALKPHOS 72 01/03/2019   BILITOT 0.3 01/03/2019   GFRNONAA >60 01/03/2019   GFRAA >60 01/03/2019    Lab Results  Component Value Date   WBC 6.0 01/03/2019   NEUTROABS 2.9 01/03/2019   HGB 11.4 (L) 01/03/2019   HCT 33.1 (L) 01/03/2019   MCV 96.2 01/03/2019   PLT 234 01/03/2019   Lab Results  Component Value Date   IRON 171 (H) 01/03/2019   TIBC 205 (L) 01/03/2019   IRONPCTSAT 84 (H) 01/03/2019   Lab Results  Component Value Date   FERRITIN 47 01/03/2019     STUDIES: No results found.  ASSESSMENT: Hereditary hemochromatosis.   PLAN:    1. Hereditary hemochromatosis: Originally diagnosed in July 2011 with homozygous C282Y mutation.  Previously, patient had a difficult time with phlebotomy and was initiated on Exjade for iron chelation.  Her current dose is 250 mg daily which is been stable and unchanged since December 2014.   AFP is normal and unchanged at 2.3.  Her most recent abdominal ultrasound on June 20, 2018 did not reveal any suspicious abnormalities in her liver.  Although patient's iron saturation is 84%, her ferritin level remains within normal limits at 47.  She wishes to try a trial off of Exjade.  Patient will return to clinic in 3 months with repeat laboratory and video assisted telemedicine visit.  If her iron stores trend up over that time we will reinitiate Exjade.   2.  Port maintenance: Continue port flushes every 6 to 8 weeks. 3.  Spina bifida: Chronic and unchanged. 4.  Anemia: Mild, monitor.  I spent a total of 20 minutes face-to-face with the patient of which greater than 50% of the visit was spent in counseling and coordination of care as detailed above.   Patient expressed understanding and was in agreement with this plan. She also understands that She  can call clinic at any time with any questions, concerns, or complaints.    Lloyd Huger, MD   01/04/2019 6:30 AM

## 2019-01-02 ENCOUNTER — Other Ambulatory Visit: Payer: Self-pay

## 2019-01-03 ENCOUNTER — Other Ambulatory Visit: Payer: Self-pay

## 2019-01-03 ENCOUNTER — Inpatient Hospital Stay: Attending: Oncology

## 2019-01-03 ENCOUNTER — Encounter: Payer: Self-pay | Admitting: Oncology

## 2019-01-03 ENCOUNTER — Inpatient Hospital Stay (HOSPITAL_BASED_OUTPATIENT_CLINIC_OR_DEPARTMENT_OTHER): Admitting: Oncology

## 2019-01-03 DIAGNOSIS — G4730 Sleep apnea, unspecified: Secondary | ICD-10-CM | POA: Insufficient documentation

## 2019-01-03 DIAGNOSIS — Q059 Spina bifida, unspecified: Secondary | ICD-10-CM | POA: Insufficient documentation

## 2019-01-03 DIAGNOSIS — I10 Essential (primary) hypertension: Secondary | ICD-10-CM | POA: Diagnosis not present

## 2019-01-03 DIAGNOSIS — Z79899 Other long term (current) drug therapy: Secondary | ICD-10-CM | POA: Insufficient documentation

## 2019-01-03 DIAGNOSIS — E039 Hypothyroidism, unspecified: Secondary | ICD-10-CM | POA: Insufficient documentation

## 2019-01-03 DIAGNOSIS — Z803 Family history of malignant neoplasm of breast: Secondary | ICD-10-CM | POA: Insufficient documentation

## 2019-01-03 DIAGNOSIS — D649 Anemia, unspecified: Secondary | ICD-10-CM | POA: Diagnosis not present

## 2019-01-03 DIAGNOSIS — Z95828 Presence of other vascular implants and grafts: Secondary | ICD-10-CM

## 2019-01-03 LAB — COMPREHENSIVE METABOLIC PANEL
ALT: 19 U/L (ref 0–44)
AST: 19 U/L (ref 15–41)
Albumin: 4.2 g/dL (ref 3.5–5.0)
Alkaline Phosphatase: 72 U/L (ref 38–126)
Anion gap: 5 (ref 5–15)
BUN: 15 mg/dL (ref 6–20)
CO2: 28 mmol/L (ref 22–32)
Calcium: 9.1 mg/dL (ref 8.9–10.3)
Chloride: 104 mmol/L (ref 98–111)
Creatinine, Ser: 0.53 mg/dL (ref 0.44–1.00)
GFR calc Af Amer: >60 mL/min (ref >60)
GFR calc non Af Amer: >60 mL/min (ref >60)
Glucose, Bld: 102 mg/dL — ABNORMAL HIGH (ref 70–99)
Potassium: 3.8 mmol/L (ref 3.5–5.1)
Sodium: 137 mmol/L (ref 135–145)
Total Bilirubin: 0.3 mg/dL (ref 0.3–1.2)
Total Protein: 7.8 g/dL (ref 6.5–8.1)

## 2019-01-03 LAB — CBC WITH DIFFERENTIAL/PLATELET
Abs Immature Granulocytes: 0.02 10*3/uL (ref 0.00–0.07)
Basophils Absolute: 0 10*3/uL (ref 0.0–0.1)
Basophils Relative: 1 %
Eosinophils Absolute: 0.2 10*3/uL (ref 0.0–0.5)
Eosinophils Relative: 3 %
HCT: 33.1 % — ABNORMAL LOW (ref 36.0–46.0)
Hemoglobin: 11.4 g/dL — ABNORMAL LOW (ref 12.0–15.0)
Immature Granulocytes: 0 %
Lymphocytes Relative: 37 %
Lymphs Abs: 2.2 10*3/uL (ref 0.7–4.0)
MCH: 33.1 pg (ref 26.0–34.0)
MCHC: 34.4 g/dL (ref 30.0–36.0)
MCV: 96.2 fL (ref 80.0–100.0)
Monocytes Absolute: 0.6 10*3/uL (ref 0.1–1.0)
Monocytes Relative: 11 %
Neutro Abs: 2.9 10*3/uL (ref 1.7–7.7)
Neutrophils Relative %: 48 %
Platelets: 234 10*3/uL (ref 150–400)
RBC: 3.44 MIL/uL — ABNORMAL LOW (ref 3.87–5.11)
RDW: 11.9 % (ref 11.5–15.5)
WBC: 6 10*3/uL (ref 4.0–10.5)
nRBC: 0 % (ref 0.0–0.2)

## 2019-01-03 LAB — FERRITIN: Ferritin: 47 ng/mL (ref 11–307)

## 2019-01-03 LAB — IRON AND TIBC
Iron: 171 ug/dL — ABNORMAL HIGH (ref 28–170)
Saturation Ratios: 84 % — ABNORMAL HIGH (ref 10.4–31.8)
TIBC: 205 ug/dL — ABNORMAL LOW (ref 250–450)
UIBC: 34 ug/dL

## 2019-01-03 MED ORDER — SODIUM CHLORIDE 0.9% FLUSH
10.0000 mL | Freq: Once | INTRAVENOUS | Status: AC
  Administered 2019-01-03: 10 mL via INTRAVENOUS
  Filled 2019-01-03: qty 10

## 2019-01-03 MED ORDER — HEPARIN SOD (PORK) LOCK FLUSH 100 UNIT/ML IV SOLN
500.0000 [IU] | Freq: Once | INTRAVENOUS | Status: AC
  Administered 2019-01-03: 14:00:00 500 [IU] via INTRAVENOUS

## 2019-01-03 NOTE — Progress Notes (Signed)
Patient denies any concerns today.  

## 2019-01-04 LAB — AFP TUMOR MARKER: AFP, Serum, Tumor Marker: 2.3 ng/mL (ref 0.0–8.3)

## 2019-01-29 ENCOUNTER — Other Ambulatory Visit: Payer: Self-pay

## 2019-01-29 MED ORDER — METOPROLOL TARTRATE 25 MG PO TABS: 25.0000 mg | ORAL_TABLET | Freq: Two times a day (BID) | ORAL | 2 refills | Status: DC

## 2019-03-04 ENCOUNTER — Encounter: Payer: Self-pay | Admitting: Internal Medicine

## 2019-03-04 ENCOUNTER — Ambulatory Visit (INDEPENDENT_AMBULATORY_CARE_PROVIDER_SITE_OTHER): Payer: PRIVATE HEALTH INSURANCE | Admitting: Internal Medicine

## 2019-03-04 ENCOUNTER — Other Ambulatory Visit: Payer: Self-pay

## 2019-03-04 VITALS — BP 140/69 | HR 81 | Temp 97.7°F | Resp 16 | Ht <= 58 in | Wt 123.0 lb

## 2019-03-04 DIAGNOSIS — I10 Essential (primary) hypertension: Secondary | ICD-10-CM

## 2019-03-04 DIAGNOSIS — G40909 Epilepsy, unspecified, not intractable, without status epilepticus: Secondary | ICD-10-CM | POA: Diagnosis not present

## 2019-03-04 DIAGNOSIS — Z23 Encounter for immunization: Secondary | ICD-10-CM

## 2019-03-04 DIAGNOSIS — Q059 Spina bifida, unspecified: Secondary | ICD-10-CM

## 2019-03-04 DIAGNOSIS — G4733 Obstructive sleep apnea (adult) (pediatric): Secondary | ICD-10-CM

## 2019-03-04 DIAGNOSIS — Z9989 Dependence on other enabling machines and devices: Secondary | ICD-10-CM

## 2019-03-04 MED ORDER — METOPROLOL TARTRATE 25 MG PO TABS: 25.0000 mg | ORAL_TABLET | Freq: Two times a day (BID) | ORAL | 2 refills | Status: DC

## 2019-03-04 NOTE — Progress Notes (Signed)
[facility] [place], [place] [address]  Pulmonary Sleep Medicine   Office Visit Note  Patient Name: Christina Stephens DOB: April 17, 1976 MRN [id number]  Date of Service: 03/04/2019  Complaints/HPI: Patient is on her CPAP she is doing well.  Been compliant with his CPAP as prescribed.  She has no issues as far as issues with usage of the CPAP device.  Her only other concern today was need for her blood pressure medications which I went ahead and filled for her.  The patient has had no active seizure she has had no admissions to the hospital.  ROS  General: (-) fever, (-) chills, (-) night sweats, (-) weakness Skin: (-) rashes, (-) itching,. Eyes: (-) visual changes, (-) redness, (-) itching. Nose and Sinuses: (-) nasal stuffiness or itchiness, (-) postnasal drip, (-) nosebleeds, (-) sinus trouble. Mouth and Throat: (-) sore throat, (-) hoarseness. Neck: (-) swollen glands, (-) enlarged thyroid, (-) neck pain. Respiratory: - cough, (-) bloody sputum, - shortness of breath, - wheezing. Cardiovascular: - ankle swelling, (-) chest pain. Lymphatic: (-) lymph node enlargement. Neurologic: (-) numbness, (-) tingling. Psychiatric: (-) anxiety, (-) depression   Current Medication: Outpatient Encounter Medications as of 03/04/2019  Medication Sig Note  . CALCIUM-VITAMIN D PO Take 1 tablet by mouth daily.   Marland Kitchen deferasirox (EXJADE) 250 MG disintegrating tablet Take 1 tablet (250 mg total) by mouth daily before breakfast.   . levothyroxine (SYNTHROID) 50 MCG tablet Take 1 tablet (50 mcg total) by mouth daily.   . metoprolol tartrate (LOPRESSOR) 25 MG tablet Take 1 tablet (25 mg total) by mouth 2 (two) times daily.   . phenytoin (DILANTIN) 100 MG ER capsule TAKE 2 CAPSULES (200 MG TOTAL) BY MOUTH 2 (TWO) TIMES DAILY. 01/02/2015: Received from: External Pharmacy  . polyethylene glycol (MIRALAX / GLYCOLAX) packet 1 POWDER PACK IN 12 OZ WATER DAILY FOR CONSTIPATION   .  traMADol (ULTRAM) 50 MG tablet Take 1 tablet (50 mg total) by mouth every 6 (six) hours as needed for moderate pain.    Facility-Administered Encounter Medications as of 03/04/2019  Medication  . sodium chloride flush (NS) 0.9 % injection 10 mL  . sodium chloride flush (NS) 0.9 % injection 10 mL    Surgical History: Past Surgical History:  Procedure Laterality Date  . REDUCTION MAMMAPLASTY Bilateral 1993  . VP STUNT HEAD SURGERYOther]  2000    Medical History: Past Medical History:  Diagnosis Date  . CPAP (continuous positive airway pressure) dependence   . Hereditary hemochromatosis ([place])   . Hypertension   . Seizures ([place])   . Sleep apnea   . Spina bifida ([place])   . Thyroid disease    hypothyroidism    Family History: Family History  Problem Relation Age of Onset  . Ovarian cancer Maternal Aunt   . Hypertension Father   . CAD Father   . Arthritis Sister   . CAD Paternal Grandfather   . Breast cancer Maternal Grandmother     Social History: Social History   Socioeconomic History  . Marital status: Single    Spouse name: Not on file  . Number of children: Not on file  . Years of education: Not on file  . Highest education level: Not on file  Occupational History  . Not on file  Social Needs  . Financial resource strain: Not on file  . Food insecurity    Worry: Not on file    Inability: Not on file  .  Transportation needs    Medical: Not on file    Non-medical: Not on file  Tobacco Use  . Smoking status: Never Smoker  . Smokeless tobacco: Never Used  Substance and Sexual Activity  . Alcohol use: No  . Drug use: No  . Sexual activity: Not on file  Lifestyle  . Physical activity    Days per week: Not on file    Minutes per session: Not on file  . Stress: Not on file  Relationships  . Social Herbalist on phone: Not on file    Gets together: Not on file    Attends religious service: Not on file    Active member of club or organization:  Not on file    Attends meetings of clubs or organizations: Not on file    Relationship status: Not on file  . Intimate partner violence    Fear of current or ex partner: Not on file    Emotionally abused: Not on file    Physically abused: Not on file    Forced sexual activity: Not on file  Other Topics Concern  . Not on file  Social History Narrative  . Not on file    Vital Signs: Blood pressure 140/69, pulse 81, temperature 97.7 F (36.5 C), resp. rate 16, height 4' 7"  (1.397 m), weight 123 lb (55.8 kg), SpO2 97 %.  Examination: General Appearance: The patient is well-developed, well-nourished, and in no distress. Skin: Gross inspection of skin unremarkable. Head: normocephalic, no gross deformities. Eyes: no gross deformities noted. ENT: ears appear grossly normal no exudates. Neck: Supple. No thyromegaly. No LAD. Respiratory: no rhonchi noted . Cardiovascular: Normal S1 and S2 without murmur or rub. Extremities: No cyanosis. pulses are equal. Neurologic: Alert and oriented. No involuntary movements.  LABS: Recent Results (from the past 2160 hour(s))  AFP tumor marker     Status: None   Collection Time: 01/03/19  2:05 PM  Result Value Ref Range   AFP, Serum, Tumor Marker 2.3 0.0 - 8.3 ng/mL    Comment: (NOTE) Roche Diagnostics Electrochemiluminescence Immunoassay (ECLIA) Values obtained with different assay methods or kits cannot be used interchangeably.  Results cannot be interpreted as absolute evidence of the presence or absence of malignant disease. This test is not interpretable in pregnant females. Performed At: [facility] [place], [place] [id number] Rush Farmer MD IH:[id number]   Iron and TIBC     Status: Abnormal   Collection Time: 01/03/19  2:05 PM  Result Value Ref Range   Iron 171 (H) 28 - 170 ug/dL   TIBC 205 (L) 250 - 450 ug/dL   Saturation Ratios 84 (H) 10.4 - 31.8 %   UIBC 34 ug/dL    Comment: Performed at [facility], [address] [place], [place] [address]  Ferritin     Status: None   Collection Time: 01/03/19  2:05 PM  Result Value Ref Range   Ferritin 47 11 - 307 ng/mL    Comment: Performed at [facility], [address] [place], [place] [address]  CBC with Differential/Platelet     Status: Abnormal   Collection Time: 01/03/19  2:05 PM  Result Value Ref Range   WBC 6.0 4.0 - 10.5 K/uL   RBC 3.44 (L) 3.87 - 5.11 MIL/uL   Hemoglobin 11.4 (L) 12.0 - 15.0 g/dL   HCT 33.1 (L) 36.0 - 46.0 %   MCV 96.2 80.0 - 100.0 fL   MCH  33.1 26.0 - 34.0 pg   MCHC 34.4 30.0 - 36.0 g/dL   RDW 11.9 11.5 - 15.5 %   Platelets 234 150 - 400 K/uL   nRBC 0.0 0.0 - 0.2 %   Neutrophils Relative % 48 %   Neutro Abs 2.9 1.7 - 7.7 K/uL   Lymphocytes Relative 37 %   Lymphs Abs 2.2 0.7 - 4.0 K/uL   Monocytes Relative 11 %   Monocytes Absolute 0.6 0.1 - 1.0 K/uL   Eosinophils Relative 3 %   Eosinophils Absolute 0.2 0.0 - 0.5 K/uL   Basophils Relative 1 %   Basophils Absolute 0.0 0.0 - 0.1 K/uL   Immature Granulocytes 0 %   Abs Immature Granulocytes 0.02 0.00 - 0.07 K/uL    Comment: Performed at [facility], [address] [place], [place] [address]  Comprehensive metabolic panel     Status: Abnormal   Collection Time: 01/03/19  2:05 PM  Result Value Ref Range   Sodium 137 135 - 145 mmol/L   Potassium 3.8 3.5 - 5.1 mmol/L   Chloride 104 98 - 111 mmol/L   CO2 28 22 - 32 mmol/L   Glucose, Bld 102 (H) 70 - 99 mg/dL   BUN 15 6 - 20 mg/dL   Creatinine, Ser 0.53 0.44 - 1.00 mg/dL   Calcium 9.1 8.9 - 10.3 mg/dL   Total Protein 7.8 6.5 - 8.1 g/dL   Albumin 4.2 3.5 - 5.0 g/dL   AST 19 15 - 41 U/L   ALT 19 0 - 44 U/L   Alkaline Phosphatase 72 38 - 126 U/L   Total Bilirubin 0.3 0.3 - 1.2 mg/dL   GFR calc non Af Amer >60 >60 mL/min   GFR calc Af Amer >60 >60 mL/min   Anion gap 5 5 - 15    Comment: Performed at [facility], [address] [place], [place] [address]     Radiology: Mm 3d Screen Breast Bilateral  Result Date: 09/10/2018 CLINICAL DATA:  Screening. EXAM: DIGITAL SCREENING BILATERAL MAMMOGRAM WITH TOMO AND CAD COMPARISON:  Previous exam(s). ACR Breast Density Category b: There are scattered areas of fibroglandular density. FINDINGS: There are no findings suspicious for malignancy. Images were processed with CAD. IMPRESSION: No mammographic evidence of malignancy. A result letter of this screening mammogram will be mailed directly to the patient. RECOMMENDATION: Screening mammogram in one year. (Code:SM-B-01Y) BI-RADS CATEGORY  1: Negative. Electronically Signed   By: Lajean Manes M.D.   On: 09/10/2018 09:58    No results found.  No results found.    Assessment and Plan: Patient Active Problem List   Diagnosis Date Noted  . Dysuria 10/01/2018  . Acquired hypothyroidism 06/12/2018  . Routine cervical smear 06/12/2018  . Needs flu shot 02/04/2018  . Essential hypertension 05/31/2017  . Sleep apnea 05/31/2017  . Spina bifida ([place]) 05/31/2017  . Hemochromatosis 11/28/2014  . Word finding difficulty 02/28/2013  . Seizure disorder ([place]) 02/04/2013  . Neurogenic bladder 02/04/2013  . Left ankle sprain 09/29/2011  . Left leg pain 09/29/2011    1. OSA on CPAP therapy should be continued on current settings.  She will continue to get downloads as per her schedule 2. Spina Bifeda stable supportive care 3. Seizure disorder no active seizure noted at this time. 4. Hypertension prescription for metoprolol 25 mg twice daily was given today  General Counseling: I have discussed the findings of the evaluation and examination with Sheppard Evens.  I have also discussed any  further diagnostic evaluation thatmay be needed or ordered today. Ahliya verbalizes understanding of the findings of todays visit. We also reviewed her medications today and discussed drug interactions and side effects including but not limited excessive drowsiness and altered mental  states. We also discussed that there is always a risk not just to her but also people around her. she has been encouraged to call the office with any questions or concerns that should arise related to todays visit.    Time spent: 66mn  I have personally obtained a history, examined the patient, evaluated laboratory and imaging results, formulated the assessment and plan and placed orders.    SAllyne Gee MD FUnited Regional Health Care SystemPulmonary and Critical Care Sleep medicine

## 2019-03-04 NOTE — Patient Instructions (Signed)

## 2019-03-19 ENCOUNTER — Other Ambulatory Visit: Payer: Self-pay

## 2019-03-19 ENCOUNTER — Ambulatory Visit (INDEPENDENT_AMBULATORY_CARE_PROVIDER_SITE_OTHER): Payer: PRIVATE HEALTH INSURANCE | Admitting: Nurse Practitioner

## 2019-03-19 ENCOUNTER — Encounter: Payer: Self-pay | Admitting: Nurse Practitioner

## 2019-03-19 VITALS — BP 140/77 | HR 71 | Temp 98.1°F | Resp 16 | Ht <= 58 in | Wt 123.0 lb

## 2019-03-19 DIAGNOSIS — G40909 Epilepsy, unspecified, not intractable, without status epilepticus: Secondary | ICD-10-CM | POA: Diagnosis not present

## 2019-03-19 DIAGNOSIS — I10 Essential (primary) hypertension: Secondary | ICD-10-CM | POA: Diagnosis not present

## 2019-03-19 DIAGNOSIS — E039 Hypothyroidism, unspecified: Secondary | ICD-10-CM

## 2019-03-19 NOTE — Progress Notes (Signed)
[facility] [place], [place] [address]  Internal MEDICINE  Office Visit Note  Patient Name: Christina Stephens  [id number]  [id number]  Date of Service: 04/05/2019  Chief Complaint  Patient presents with  . Hypertension  . Hypothyroidism    The patient is here for routine follow up visit. She has no concerns or complaints today. She had labs done prior to this visit. All were good. She does have iron deficiency anemia for which she sees hematology. Her pap was normal and her mammogram was negative.       Current Medication: Outpatient Encounter Medications as of 03/19/2019  Medication Sig Note  . CALCIUM-VITAMIN D PO Take 1 tablet by mouth daily.   Marland Kitchen levothyroxine (SYNTHROID) 50 MCG tablet Take 1 tablet (50 mcg total) by mouth daily.   . metoprolol tartrate (LOPRESSOR) 25 MG tablet Take 1 tablet (25 mg total) by mouth 2 (two) times daily.   . phenytoin (DILANTIN) 100 MG ER capsule TAKE 2 CAPSULES (200 MG TOTAL) BY MOUTH 2 (TWO) TIMES DAILY. 01/02/2015: Received from: External Pharmacy  . polyethylene glycol (MIRALAX / GLYCOLAX) packet 1 POWDER PACK IN 12 OZ WATER DAILY FOR CONSTIPATION   . traMADol (ULTRAM) 50 MG tablet Take 1 tablet (50 mg total) by mouth every 6 (six) hours as needed for moderate pain.   Marland Kitchen deferasirox (EXJADE) 250 MG disintegrating tablet Take 1 tablet (250 mg total) by mouth daily before breakfast. (Patient not taking: Reported on 03/19/2019)    Facility-Administered Encounter Medications as of 03/19/2019  Medication  . sodium chloride flush (NS) 0.9 % injection 10 mL  . sodium chloride flush (NS) 0.9 % injection 10 mL    Surgical History: Past Surgical History:  Procedure Laterality Date  . REDUCTION MAMMAPLASTY Bilateral 1993  . VP STUNT HEAD SURGERYOther]  2000    Medical History: Past Medical History:  Diagnosis Date  . CPAP (continuous positive airway pressure) dependence   . Hereditary hemochromatosis ([place])   .  Hypertension   . Seizures ([place])   . Sleep apnea   . Spina bifida ([place])   . Thyroid disease    hypothyroidism    Family History: Family History  Problem Relation Age of Onset  . Ovarian cancer Maternal Aunt   . Hypertension Father   . CAD Father   . Arthritis Sister   . CAD Paternal Grandfather   . Breast cancer Maternal Grandmother     Social History   Socioeconomic History  . Marital status: Single    Spouse name: Not on file  . Number of children: Not on file  . Years of education: Not on file  . Highest education level: Not on file  Occupational History  . Not on file  Social Needs  . Financial resource strain: Not on file  . Food insecurity    Worry: Not on file    Inability: Not on file  . Transportation needs    Medical: Not on file    Non-medical: Not on file  Tobacco Use  . Smoking status: Never Smoker  . Smokeless tobacco: Never Used  Substance and Sexual Activity  . Alcohol use: No  . Drug use: No  . Sexual activity: Not on file  Lifestyle  . Physical activity    Days per week: Not on file    Minutes per session: Not on file  . Stress: Not on file  Relationships  . Social Herbalist on phone: Not  on file    Gets together: Not on file    Attends religious service: Not on file    Active member of club or organization: Not on file    Attends meetings of clubs or organizations: Not on file    Relationship status: Not on file  . Intimate partner violence    Fear of current or ex partner: Not on file    Emotionally abused: Not on file    Physically abused: Not on file    Forced sexual activity: Not on file  Other Topics Concern  . Not on file  Social History Narrative  . Not on file      Review of Systems  Constitutional: Negative for activity change, chills, fatigue and unexpected weight change.  HENT: Negative for congestion, postnasal drip, rhinorrhea, sneezing and sore throat.   Respiratory: Negative for apnea, cough, chest  tightness, shortness of breath and wheezing.   Cardiovascular: Negative for chest pain and palpitations.  Gastrointestinal: Negative for abdominal pain, constipation, diarrhea, nausea and vomiting.  Endocrine: Negative for cold intolerance, heat intolerance, polydipsia and polyuria.       Stable hypothyroid  Musculoskeletal: Positive for arthralgias and gait problem. Negative for back pain, joint swelling and neck pain.  Skin: Negative for rash.  Allergic/Immunologic: Negative.  Negative for environmental allergies.  Neurological: Positive for seizures. Negative for tremors and numbness.  Hematological: Negative for adenopathy. Does not bruise/bleed easily.  Psychiatric/Behavioral: Negative for behavioral problems (Depression), sleep disturbance and suicidal ideas. The patient is not nervous/anxious.    Today's Vitals   03/19/19 0944  BP: 140/77  Pulse: 71  Resp: 16  Temp: 98.1 F (36.7 C)  SpO2: 98%  Weight: 123 lb (55.8 kg)  Height: 4' 7"  (1.397 m)   Body mass index is 28.59 kg/m.  Physical Exam Vitals signs and nursing note reviewed.  Constitutional:      General: She is not in acute distress.    Appearance: Normal appearance. She is well-developed. She is not diaphoretic.  HENT:     Head: Normocephalic and atraumatic.     Nose: Nose normal.     Mouth/Throat:     Pharynx: No oropharyngeal exudate.  Eyes:     Conjunctiva/sclera: Conjunctivae normal.     Pupils: Pupils are equal, round, and reactive to light.  Neck:     Musculoskeletal: Normal range of motion and neck supple.     Thyroid: No thyromegaly.     Vascular: No JVD.     Trachea: No tracheal deviation.  Cardiovascular:     Rate and Rhythm: Normal rate and regular rhythm.     Heart sounds: Normal heart sounds. No murmur. No friction rub. No gallop.   Pulmonary:     Effort: Pulmonary effort is normal. No respiratory distress.     Breath sounds: Normal breath sounds. No wheezing or rales.  Chest:     Chest  wall: No tenderness.  Abdominal:     General: Bowel sounds are normal.     Palpations: Abdomen is soft.     Tenderness: There is no abdominal tenderness.  Musculoskeletal: Normal range of motion.  Lymphadenopathy:     Cervical: No cervical adenopathy.  Skin:    General: Skin is warm and dry.  Neurological:     Mental Status: She is alert and oriented to person, place, and time.     Cranial Nerves: No cranial nerve deficit.     Comments: Patient is at her neurological baseline.  Psychiatric:        Behavior: Behavior normal.        Thought Content: Thought content normal.        Judgment: Judgment normal.    Assessment/Plan:  1. Essential hypertension Stable. Continue bp medication as prescribed   2. Acquired hypothyroidism Stable. Continue levothyroxine as prescribed   3. Hereditary hemochromatosis ([place]) Regular visits with hematology as scheduled.   4. Seizure disorder ([place]) Continue dilantin as prescribed   General Counseling: Ardelia verbalizes understanding of the findings of todays visit and agrees with plan of treatment. I have discussed any further diagnostic evaluation that may be needed or ordered today. We also reviewed her medications today. she has been encouraged to call the office with any questions or concerns that should arise related to todays visit.  This patient was seen by Leretha Pol FNP Collaboration with Dr [name] as a part of collaborative care agreement   Time spent: 101 Minutes      Dr [name] Internal medicine

## 2019-04-03 NOTE — Progress Notes (Signed)
[place]  Telephone:(336) [phone] Fax:(336) [phone]  ID: Salem Senate OB: [birth year]  MR#: [id number]  QMG#:[id number]  Patient Care Team: [name], MD as PCP - General (Internal Medicine)  I connected with Salem Senate on 04/10/19 at  2:15 PM EST by video enabled telemedicine visit and verified that I am speaking with the correct person using two identifiers.   I discussed the limitations, risks, security and privacy concerns of performing an evaluation and management service by telemedicine and the availability of in-person appointments. I also discussed with the patient that there may be a patient responsible charge related to this service. The patient expressed understanding and agreed to proceed.   Other persons participating in the visit and their role in the encounter: Patient, MD  Patient's location: Home Provider's location: Clinic  CHIEF COMPLAINT: Hereditary hemochromatosis.  INTERVAL HISTORY: He was initially started as video enabled telemedicine, but due to technical difficulties was transitioned to telephone only.  Patient currently feels well and is at her baseline. She has no neurologic complaints.  She denies any recent fevers or illnesses.  She has a good appetite and denies weight loss.  She denies any chest pain, shortness of breath, cough, or hemoptysis.  She denies any nausea, vomiting, constipation, or diarrhea.  She has no urinary complaints.  Patient offers no specific complaints today.  REVIEW OF SYSTEMS:   Review of Systems  Constitutional: Negative.  Negative for fever, malaise/fatigue and weight loss.  Respiratory: Negative.  Negative for cough, hemoptysis and shortness of breath.   Cardiovascular: Negative.  Negative for chest pain and leg swelling.  Gastrointestinal: Negative.  Negative for abdominal pain.  Genitourinary: Negative.  Negative for dysuria.  Musculoskeletal: Negative.  Negative for back pain.  Skin: Negative.   Negative for rash.  Neurological: Negative.  Negative for dizziness, focal weakness and weakness.  Psychiatric/Behavioral: Negative.  The patient is not nervous/anxious.     As per HPI. Otherwise, a complete review of systems is negative.  PAST MEDICAL HISTORY: Past Medical History:  Diagnosis Date  . CPAP (continuous positive airway pressure) dependence   . Hereditary hemochromatosis ([place])   . Hypertension   . Seizures ([place])   . Sleep apnea   . Spina bifida ([place])   . Thyroid disease    hypothyroidism    PAST SURGICAL HISTORY: Past Surgical History:  Procedure Laterality Date  . REDUCTION MAMMAPLASTY Bilateral 1993  . VP STUNT HEAD SURGERYOther]  2000    FAMILY HISTORY: Family History  Problem Relation Age of Onset  . Ovarian cancer Maternal Aunt   . Hypertension Father   . CAD Father   . Arthritis Sister   . CAD Paternal Grandfather   . Breast cancer Maternal Grandmother     ADVANCED DIRECTIVES (Y/N):  N  HEALTH MAINTENANCE: Social History   Tobacco Use  . Smoking status: Never Smoker  . Smokeless tobacco: Never Used  Substance Use Topics  . Alcohol use: No  . Drug use: No     Colonoscopy:  PAP:  Bone density:  Lipid panel:  Allergies  Allergen Reactions  . Latex     Pt has Spina bifada and was told that if she has surgery she should avoid latex for poss. Reaction/infection.    Current Outpatient Medications  Medication Sig Dispense Refill  . CALCIUM-VITAMIN D PO Take 1 tablet by mouth daily.    Marland Kitchen deferasirox (EXJADE) 250 MG disintegrating tablet Take 1 tablet (250 mg total) by mouth  daily before breakfast. (Patient not taking: Reported on 03/19/2019) 30 tablet 11  . levothyroxine (SYNTHROID) 50 MCG tablet Take 1 tablet (50 mcg total) by mouth daily. 90 tablet 3  . metoprolol tartrate (LOPRESSOR) 25 MG tablet Take 1 tablet (25 mg total) by mouth 2 (two) times daily. 60 tablet 2  . phenytoin (DILANTIN) 100 MG ER capsule TAKE 2 CAPSULES (200 MG  TOTAL) BY MOUTH 2 (TWO) TIMES DAILY.  11  . polyethylene glycol (MIRALAX / GLYCOLAX) packet 1 POWDER PACK IN 12 OZ WATER DAILY FOR CONSTIPATION    . traMADol (ULTRAM) 50 MG tablet Take 1 tablet (50 mg total) by mouth every 6 (six) hours as needed for moderate pain. 12 tablet 0   No current facility-administered medications for this visit.    Facility-Administered Medications Ordered in Other Visits  Medication Dose Route Frequency Provider Last Rate Last Dose  . sodium chloride flush (NS) 0.9 % injection 10 mL  10 mL Intravenous PRN Cammie Sickle, MD   10 mL at 02/26/16 0917  . sodium chloride flush (NS) 0.9 % injection 10 mL  10 mL Intravenous PRN Lequita Asal, MD   10 mL at 01/01/18 1108    OBJECTIVE: There were no vitals filed for this visit.   There is no height or weight on file to calculate BMI.    ECOG FS:0 - Asymptomatic  General: Well-developed, well-nourished, no acute distress. HEENT: Normocephalic. Neuro: Alert, answering all questions appropriately. Cranial nerves grossly intact. Psych: Normal affect.  LAB RESULTS:  Lab Results  Component Value Date   NA 135 04/08/2019   K 3.9 04/08/2019   CL 101 04/08/2019   CO2 26 04/08/2019   GLUCOSE 110 (H) 04/08/2019   BUN 17 04/08/2019   CREATININE 0.53 04/08/2019   CALCIUM 8.9 04/08/2019   PROT 7.6 04/08/2019   ALBUMIN 4.1 04/08/2019   AST 25 04/08/2019   ALT 27 04/08/2019   ALKPHOS 59 04/08/2019   BILITOT 0.4 04/08/2019   GFRNONAA >60 04/08/2019   GFRAA >60 04/08/2019    Lab Results  Component Value Date   WBC 6.7 04/08/2019   NEUTROABS 3.7 04/08/2019   HGB 11.5 (L) 04/08/2019   HCT 34.8 (L) 04/08/2019   MCV 99.4 04/08/2019   PLT 270 04/08/2019   Lab Results  Component Value Date   IRON 186 (H) 04/08/2019   TIBC 210 (L) 04/08/2019   IRONPCTSAT 89 (H) 04/08/2019   Lab Results  Component Value Date   FERRITIN 53 04/08/2019     STUDIES: No results found.  ASSESSMENT: Hereditary  hemochromatosis.   PLAN:    1. Hereditary hemochromatosis: Originally diagnosed in July 2011 with homozygous C282Y mutation.  Previously, patient had a difficult time with phlebotomy and was initiated on Exjade for iron chelation.  Her current dose is 250 mg daily which is been stable and unchanged since December 2014.  AFP is normal and unchanged at 2.4.  Her most recent abdominal ultrasound on June 20, 2018 did not reveal any suspicious abnormalities in her liver.  Exjade was discontinued in approximately September 2020.  Her iron saturation is trended up slightly to 89%.  Her ferritin remains decreased, but has trended up to 53.  Will continue to hold Exjade, but if her iron stores trend up again we will likely reinitiate treatment.  Return to clinic in 3 months with repeat laboratory work and video assisted telemedicine visit.   2.  Port maintenance: Continue port flushes every 6 to 8  weeks. 3.  Spina bifida: Chronic and unchanged. 4.  Anemia: Chronic and unchanged.  I provided 15 minutes of face-to-face video visit time during this encounter, and > 50% was spent counseling as documented under my assessment & plan.    Patient expressed understanding and was in agreement with this plan. She also understands that She can call clinic at any time with any questions, concerns, or complaints.    Lloyd Huger, MD   04/10/2019 7:03 AM

## 2019-04-08 ENCOUNTER — Inpatient Hospital Stay: Attending: Oncology

## 2019-04-08 ENCOUNTER — Other Ambulatory Visit: Payer: Self-pay

## 2019-04-08 DIAGNOSIS — Z95828 Presence of other vascular implants and grafts: Secondary | ICD-10-CM

## 2019-04-08 LAB — IRON AND TIBC
Iron: 186 ug/dL — ABNORMAL HIGH (ref 28–170)
Saturation Ratios: 89 % — ABNORMAL HIGH (ref 10.4–31.8)
TIBC: 210 ug/dL — ABNORMAL LOW (ref 250–450)
UIBC: 24 ug/dL

## 2019-04-08 LAB — CBC WITH DIFFERENTIAL/PLATELET
Abs Immature Granulocytes: 0.02 K/uL (ref 0.00–0.07)
Basophils Absolute: 0 K/uL (ref 0.0–0.1)
Basophils Relative: 1 %
Eosinophils Absolute: 0.1 K/uL (ref 0.0–0.5)
Eosinophils Relative: 2 %
HCT: 34.8 % — ABNORMAL LOW (ref 36.0–46.0)
Hemoglobin: 11.5 g/dL — ABNORMAL LOW (ref 12.0–15.0)
Immature Granulocytes: 0 %
Lymphocytes Relative: 33 %
Lymphs Abs: 2.2 K/uL (ref 0.7–4.0)
MCH: 32.9 pg (ref 26.0–34.0)
MCHC: 33 g/dL (ref 30.0–36.0)
MCV: 99.4 fL (ref 80.0–100.0)
Monocytes Absolute: 0.6 K/uL (ref 0.1–1.0)
Monocytes Relative: 9 %
Neutro Abs: 3.7 K/uL (ref 1.7–7.7)
Neutrophils Relative %: 55 %
Platelets: 270 K/uL (ref 150–400)
RBC: 3.5 MIL/uL — ABNORMAL LOW (ref 3.87–5.11)
RDW: 12.2 % (ref 11.5–15.5)
WBC: 6.7 K/uL (ref 4.0–10.5)
nRBC: 0 % (ref 0.0–0.2)

## 2019-04-08 LAB — COMPREHENSIVE METABOLIC PANEL WITH GFR
ALT: 27 U/L (ref 0–44)
AST: 25 U/L (ref 15–41)
Albumin: 4.1 g/dL (ref 3.5–5.0)
Alkaline Phosphatase: 59 U/L (ref 38–126)
Anion gap: 8 (ref 5–15)
BUN: 17 mg/dL (ref 6–20)
CO2: 26 mmol/L (ref 22–32)
Calcium: 8.9 mg/dL (ref 8.9–10.3)
Chloride: 101 mmol/L (ref 98–111)
Creatinine, Ser: 0.53 mg/dL (ref 0.44–1.00)
GFR calc Af Amer: >60 mL/min
GFR calc non Af Amer: >60 mL/min
Glucose, Bld: 110 mg/dL — ABNORMAL HIGH (ref 70–99)
Potassium: 3.9 mmol/L (ref 3.5–5.1)
Sodium: 135 mmol/L (ref 135–145)
Total Bilirubin: 0.4 mg/dL (ref 0.3–1.2)
Total Protein: 7.6 g/dL (ref 6.5–8.1)

## 2019-04-08 LAB — FERRITIN: Ferritin: 53 ng/mL (ref 11–307)

## 2019-04-08 MED ORDER — HEPARIN SOD (PORK) LOCK FLUSH 100 UNIT/ML IV SOLN
500.0000 [IU] | Freq: Once | INTRAVENOUS | Status: AC
  Administered 2019-04-08: 15:00:00 500 [IU] via INTRAVENOUS

## 2019-04-08 MED ORDER — SODIUM CHLORIDE 0.9% FLUSH
10.0000 mL | Freq: Once | INTRAVENOUS | Status: AC
  Administered 2019-04-08: 10 mL via INTRAVENOUS
  Filled 2019-04-08: qty 10

## 2019-04-09 ENCOUNTER — Inpatient Hospital Stay: Attending: Oncology | Admitting: Oncology

## 2019-04-09 LAB — AFP TUMOR MARKER: AFP, Serum, Tumor Marker: 2.4 ng/mL (ref 0.0–8.3)

## 2019-04-10 ENCOUNTER — Other Ambulatory Visit: Payer: Self-pay

## 2019-04-10 DIAGNOSIS — I10 Essential (primary) hypertension: Secondary | ICD-10-CM

## 2019-04-10 MED ORDER — METOPROLOL TARTRATE 25 MG PO TABS: 25.0000 mg | ORAL_TABLET | Freq: Two times a day (BID) | ORAL | 2 refills | Status: DC

## 2019-06-10 ENCOUNTER — Telehealth: Payer: Self-pay

## 2019-06-10 NOTE — Telephone Encounter (Signed)
Confirmed cpap clinic appt in office 06/12/19

## 2019-06-12 ENCOUNTER — Other Ambulatory Visit: Payer: Self-pay

## 2019-06-12 ENCOUNTER — Ambulatory Visit (INDEPENDENT_AMBULATORY_CARE_PROVIDER_SITE_OTHER): Payer: PRIVATE HEALTH INSURANCE

## 2019-06-12 DIAGNOSIS — G4733 Obstructive sleep apnea (adult) (pediatric): Secondary | ICD-10-CM

## 2019-06-12 DIAGNOSIS — Z9989 Dependence on other enabling machines and devices: Secondary | ICD-10-CM

## 2019-06-12 NOTE — Progress Notes (Signed)
95 percentile pressure 13.3   95th percentile leak 0.12     apnea-hypopnea index  19.7 /hr   total days used  >4 hr 30 days  total days used <4 hr 0 days  Total compliance 100 percent  No problems or questions at this time

## 2019-07-08 ENCOUNTER — Other Ambulatory Visit: Payer: Self-pay

## 2019-07-08 ENCOUNTER — Inpatient Hospital Stay: Attending: Oncology | Admitting: *Deleted

## 2019-07-08 DIAGNOSIS — Z803 Family history of malignant neoplasm of breast: Secondary | ICD-10-CM | POA: Diagnosis not present

## 2019-07-08 DIAGNOSIS — D649 Anemia, unspecified: Secondary | ICD-10-CM | POA: Insufficient documentation

## 2019-07-08 DIAGNOSIS — Q059 Spina bifida, unspecified: Secondary | ICD-10-CM | POA: Insufficient documentation

## 2019-07-08 DIAGNOSIS — Z79899 Other long term (current) drug therapy: Secondary | ICD-10-CM | POA: Insufficient documentation

## 2019-07-08 DIAGNOSIS — Z8261 Family history of arthritis: Secondary | ICD-10-CM | POA: Insufficient documentation

## 2019-07-08 DIAGNOSIS — Z8041 Family history of malignant neoplasm of ovary: Secondary | ICD-10-CM | POA: Insufficient documentation

## 2019-07-08 DIAGNOSIS — Z95828 Presence of other vascular implants and grafts: Secondary | ICD-10-CM

## 2019-07-08 DIAGNOSIS — E039 Hypothyroidism, unspecified: Secondary | ICD-10-CM | POA: Insufficient documentation

## 2019-07-08 DIAGNOSIS — I10 Essential (primary) hypertension: Secondary | ICD-10-CM | POA: Diagnosis not present

## 2019-07-08 DIAGNOSIS — Z8249 Family history of ischemic heart disease and other diseases of the circulatory system: Secondary | ICD-10-CM | POA: Insufficient documentation

## 2019-07-08 DIAGNOSIS — G4730 Sleep apnea, unspecified: Secondary | ICD-10-CM | POA: Insufficient documentation

## 2019-07-08 LAB — COMPREHENSIVE METABOLIC PANEL WITH GFR
ALT: 25 U/L (ref 0–44)
AST: 22 U/L (ref 15–41)
Albumin: 3.9 g/dL (ref 3.5–5.0)
Alkaline Phosphatase: 57 U/L (ref 38–126)
Anion gap: 9 (ref 5–15)
BUN: 22 mg/dL — ABNORMAL HIGH (ref 6–20)
CO2: 26 mmol/L (ref 22–32)
Calcium: 8.7 mg/dL — ABNORMAL LOW (ref 8.9–10.3)
Chloride: 102 mmol/L (ref 98–111)
Creatinine, Ser: 0.58 mg/dL (ref 0.44–1.00)
GFR calc Af Amer: >60 mL/min
GFR calc non Af Amer: >60 mL/min
Glucose, Bld: 159 mg/dL — ABNORMAL HIGH (ref 70–99)
Potassium: 4.1 mmol/L (ref 3.5–5.1)
Sodium: 137 mmol/L (ref 135–145)
Total Bilirubin: 0.3 mg/dL (ref 0.3–1.2)
Total Protein: 7.2 g/dL (ref 6.5–8.1)

## 2019-07-08 LAB — CBC WITH DIFFERENTIAL/PLATELET
Abs Immature Granulocytes: 0.01 10*3/uL (ref 0.00–0.07)
Basophils Absolute: 0 10*3/uL (ref 0.0–0.1)
Basophils Relative: 1 %
Eosinophils Absolute: 0.2 10*3/uL (ref 0.0–0.5)
Eosinophils Relative: 2 %
HCT: 34.3 % — ABNORMAL LOW (ref 36.0–46.0)
Hemoglobin: 11.5 g/dL — ABNORMAL LOW (ref 12.0–15.0)
Immature Granulocytes: 0 %
Lymphocytes Relative: 32 %
Lymphs Abs: 2.1 10*3/uL (ref 0.7–4.0)
MCH: 33 pg (ref 26.0–34.0)
MCHC: 33.5 g/dL (ref 30.0–36.0)
MCV: 98.3 fL (ref 80.0–100.0)
Monocytes Absolute: 0.6 10*3/uL (ref 0.1–1.0)
Monocytes Relative: 8 %
Neutro Abs: 3.7 10*3/uL (ref 1.7–7.7)
Neutrophils Relative %: 57 %
Platelets: 245 10*3/uL (ref 150–400)
RBC: 3.49 MIL/uL — ABNORMAL LOW (ref 3.87–5.11)
RDW: 12 % (ref 11.5–15.5)
WBC: 6.5 10*3/uL (ref 4.0–10.5)
nRBC: 0 % (ref 0.0–0.2)

## 2019-07-08 LAB — IRON AND TIBC
Iron: 176 ug/dL — ABNORMAL HIGH (ref 28–170)
Saturation Ratios: 91 % — ABNORMAL HIGH (ref 10.4–31.8)
TIBC: 193 ug/dL — ABNORMAL LOW (ref 250–450)
UIBC: 17 ug/dL

## 2019-07-08 MED ORDER — HEPARIN SOD (PORK) LOCK FLUSH 100 UNIT/ML IV SOLN
500.0000 [IU] | Freq: Once | INTRAVENOUS | Status: AC
  Administered 2019-07-08: 500 [IU] via INTRAVENOUS
  Filled 2019-07-08: qty 5

## 2019-07-08 MED ORDER — SODIUM CHLORIDE 0.9% FLUSH
10.0000 mL | Freq: Once | INTRAVENOUS | Status: AC
  Administered 2019-07-08: 10 mL via INTRAVENOUS
  Filled 2019-07-08: qty 10

## 2019-07-09 LAB — AFP TUMOR MARKER: AFP, Serum, Tumor Marker: 2.2 ng/mL (ref 0.0–8.3)

## 2019-07-14 NOTE — Progress Notes (Signed)
[place]  Telephone:(336) [phone] Fax:(336) [phone]  ID: Salem Senate OB: 1975/11/04  MR#: [id number]  FOY#:[id number]  Patient Care Team: [name], MD as PCP - General (Internal Medicine)  CHIEF COMPLAINT: Hereditary hemochromatosis.  INTERVAL HISTORY: Patient returns to clinic today for repeat laboratory work and further evaluation.  She continues to feel well and is at her baseline.  She has no neurologic complaints.  She denies any recent fevers or illnesses.  She has a good appetite and denies weight loss.  She denies any chest pain, shortness of breath, cough, or hemoptysis.  She denies any nausea, vomiting, constipation, or diarrhea.  She has no urinary complaints.  Patient offers no specific complaints today.  REVIEW OF SYSTEMS:   Review of Systems  Constitutional: Negative.  Negative for fever, malaise/fatigue and weight loss.  Respiratory: Negative.  Negative for cough, hemoptysis and shortness of breath.   Cardiovascular: Negative.  Negative for chest pain and leg swelling.  Gastrointestinal: Negative.  Negative for abdominal pain.  Genitourinary: Negative.  Negative for dysuria.  Musculoskeletal: Negative.  Negative for back pain.  Skin: Negative.  Negative for rash.  Neurological: Negative.  Negative for dizziness, focal weakness and weakness.  Psychiatric/Behavioral: Negative.  The patient is not nervous/anxious.     As per HPI. Otherwise, a complete review of systems is negative.  PAST MEDICAL HISTORY: Past Medical History:  Diagnosis Date  . CPAP (continuous positive airway pressure) dependence   . Hereditary hemochromatosis ([place])   . Hypertension   . Seizures ([place])   . Sleep apnea   . Spina bifida ([place])   . Thyroid disease    hypothyroidism    PAST SURGICAL HISTORY: Past Surgical History:  Procedure Laterality Date  . REDUCTION MAMMAPLASTY Bilateral 1993  . VP STUNT HEAD SURGERYOther]  2000    FAMILY HISTORY: Family  History  Problem Relation Age of Onset  . Ovarian cancer Maternal Aunt   . Hypertension Father   . CAD Father   . Arthritis Sister   . CAD Paternal Grandfather   . Breast cancer Maternal Grandmother     ADVANCED DIRECTIVES (Y/N):  N  HEALTH MAINTENANCE: Social History   Tobacco Use  . Smoking status: Never Smoker  . Smokeless tobacco: Never Used  Substance Use Topics  . Alcohol use: No  . Drug use: No     Colonoscopy:  PAP:  Bone density:  Lipid panel:  Allergies  Allergen Reactions  . Latex     Pt has Spina bifada and was told that if she has surgery she should avoid latex for poss. Reaction/infection.    Current Outpatient Medications  Medication Sig Dispense Refill  . Calcium Carbonate-Vitamin D 600-200 MG-UNIT TABS Take by mouth.    Marland Kitchen CALCIUM-VITAMIN D PO Take 1 tablet by mouth daily.    Marland Kitchen levothyroxine (SYNTHROID) 50 MCG tablet Take 1 tablet (50 mcg total) by mouth daily. 90 tablet 3  . metoprolol tartrate (LOPRESSOR) 25 MG tablet Take 1 tablet (25 mg total) by mouth 2 (two) times daily. 60 tablet 2  . phenytoin (DILANTIN) 100 MG ER capsule TAKE 2 CAPSULES (200 MG TOTAL) BY MOUTH 2 (TWO) TIMES DAILY.  11  . polyethylene glycol (MIRALAX / GLYCOLAX) packet 1 POWDER PACK IN 12 OZ WATER DAILY FOR CONSTIPATION    . traMADol (ULTRAM) 50 MG tablet Take 1 tablet (50 mg total) by mouth every 6 (six) hours as needed for moderate pain. 12 tablet 0  . deferasirox (  EXJADE) 250 MG disintegrating tablet Take 1 tablet (250 mg total) by mouth daily before breakfast. (Patient not taking: Reported on 03/19/2019) 30 tablet 11   No current facility-administered medications for this visit.   Facility-Administered Medications Ordered in Other Visits  Medication Dose Route Frequency Provider Last Rate Last Admin  . sodium chloride flush (NS) 0.9 % injection 10 mL  10 mL Intravenous PRN Cammie Sickle, MD   10 mL at 02/26/16 0917  . sodium chloride flush (NS) 0.9 % injection 10  mL  10 mL Intravenous PRN Nolon Stalls C, MD   10 mL at 01/01/18 1108    OBJECTIVE: Vitals:   07/15/19 0847  BP: 140/76  Pulse: 96  Resp: 18  Temp: (!) 96.4 F (35.8 C)  SpO2: 100%     Body mass index is 28.59 kg/m.    ECOG FS:0 - Asymptomatic  General: Well-developed, well-nourished, no acute distress. Eyes: Pink conjunctiva, anicteric sclera. HEENT: Normocephalic, moist mucous membranes. Lungs: No audible wheezing or coughing. Heart: Regular rate and rhythm. Abdomen: Soft, nontender, no obvious distention. Musculoskeletal: No edema, cyanosis, or clubbing. Neuro: Alert, answering all questions appropriately. Cranial nerves grossly intact. Skin: No rashes or petechiae noted. Psych: Normal affect.  LAB RESULTS:  Lab Results  Component Value Date   NA 137 07/08/2019   K 4.1 07/08/2019   CL 102 07/08/2019   CO2 26 07/08/2019   GLUCOSE 159 (H) 07/08/2019   BUN 22 (H) 07/08/2019   CREATININE 0.58 07/08/2019   CALCIUM 8.7 (L) 07/08/2019   PROT 7.2 07/08/2019   ALBUMIN 3.9 07/08/2019   AST 22 07/08/2019   ALT 25 07/08/2019   ALKPHOS 57 07/08/2019   BILITOT 0.3 07/08/2019   GFRNONAA >60 07/08/2019   GFRAA >60 07/08/2019    Lab Results  Component Value Date   WBC 6.5 07/08/2019   NEUTROABS 3.7 07/08/2019   HGB 11.5 (L) 07/08/2019   HCT 34.3 (L) 07/08/2019   MCV 98.3 07/08/2019   PLT 245 07/08/2019   Lab Results  Component Value Date   IRON 176 (H) 07/08/2019   TIBC 193 (L) 07/08/2019   IRONPCTSAT 91 (H) 07/08/2019   Lab Results  Component Value Date   FERRITIN 53 04/08/2019     STUDIES: No results found.  ASSESSMENT: Hereditary hemochromatosis.   PLAN:    1. Hereditary hemochromatosis: Originally diagnosed in July 2011 with homozygous C282Y mutation.  Previously, patient had a difficult time with phlebotomy and was initiated on Exjade for iron chelation.  She previously was taking 250 mg daily which is been stable and unchanged from December  2014 through September 2020.  AFP is normal and unchanged at 2.4.  Her most recent abdominal ultrasound on June 20, 2018 did not reveal any suspicious abnormalities in her liver.  Although patient's iron saturation continues to slowly trend up at 91%, the remainder of her iron is stable and within normal limits.  After lengthy discussion with the patient, it was agreed to continue to hold Exjade, but would reinitiate treatment if the remainder of her iron panel began to trend up as well.  Return to clinic in 3 months for laboratory work only and then in 6 months for laboratory work and further evaluation. 2.  Port maintenance: Continue port flushes every 6 to 8 weeks. 3.  Spina bifida: Chronic and unchanged. 4.  Anemia: Chronic and unchanged.  I spent a total of 20 minutes reviewing chart data, face-to-face evaluation with the patient, counseling and coordination  of care as detailed above.   Patient expressed understanding and was in agreement with this plan. She also understands that She can call clinic at any time with any questions, concerns, or complaints.    Lloyd Huger, MD   07/15/2019 1:16 PM

## 2019-07-15 ENCOUNTER — Other Ambulatory Visit: Payer: Self-pay

## 2019-07-15 ENCOUNTER — Inpatient Hospital Stay (HOSPITAL_BASED_OUTPATIENT_CLINIC_OR_DEPARTMENT_OTHER): Admitting: Oncology

## 2019-07-15 ENCOUNTER — Telehealth: Admitting: Oncology

## 2019-07-15 NOTE — Progress Notes (Signed)
Patient is coming in today for follow up she is doing well no questions or complaints

## 2019-08-29 ENCOUNTER — Telehealth: Payer: Self-pay

## 2019-08-29 NOTE — Telephone Encounter (Signed)
Confirmed appointment on 09/02/2019 and screened for covid. [facility]

## 2019-09-02 ENCOUNTER — Ambulatory Visit (INDEPENDENT_AMBULATORY_CARE_PROVIDER_SITE_OTHER): Payer: PRIVATE HEALTH INSURANCE | Admitting: Internal Medicine

## 2019-09-02 ENCOUNTER — Encounter: Payer: Self-pay | Admitting: Internal Medicine

## 2019-09-02 ENCOUNTER — Other Ambulatory Visit: Payer: Self-pay

## 2019-09-02 VITALS — BP 125/65 | HR 65 | Temp 97.7°F | Resp 16 | Ht <= 58 in | Wt 123.0 lb

## 2019-09-02 DIAGNOSIS — I10 Essential (primary) hypertension: Secondary | ICD-10-CM | POA: Diagnosis not present

## 2019-09-02 DIAGNOSIS — G4733 Obstructive sleep apnea (adult) (pediatric): Secondary | ICD-10-CM | POA: Diagnosis not present

## 2019-09-02 DIAGNOSIS — G40909 Epilepsy, unspecified, not intractable, without status epilepticus: Secondary | ICD-10-CM | POA: Diagnosis not present

## 2019-09-02 DIAGNOSIS — Z9989 Dependence on other enabling machines and devices: Secondary | ICD-10-CM | POA: Diagnosis not present

## 2019-09-02 NOTE — Progress Notes (Signed)
[facility] [place], [place] [address]  Pulmonary Sleep Medicine   Office Visit Note  Patient Name: Christina Stephens DOB: 1975/08/06 MRN [id number]  Date of Service: 09/02/2019  Complaints/HPI: Pt is here for pulmonary follow up.  She has a history of osa.  Pt reports good compliance with CPAP therapy. Cleaning machine by hand, and changing filters and tubing as directed. Denies headaches, sinus issues, palpitations, or hemoptysis.  Her last compliance shows 100%.  She denies any symptoms or snoring.    ROS  General: (-) fever, (-) chills, (-) night sweats, (-) weakness Skin: (-) rashes, (-) itching,. Eyes: (-) visual changes, (-) redness, (-) itching. Nose and Sinuses: (-) nasal stuffiness or itchiness, (-) postnasal drip, (-) nosebleeds, (-) sinus trouble. Mouth and Throat: (-) sore throat, (-) hoarseness. Neck: (-) swollen glands, (-) enlarged thyroid, (-) neck pain. Respiratory: - cough, (-) bloody sputum, - shortness of breath, - wheezing. Cardiovascular: - ankle swelling, (-) chest pain. Lymphatic: (-) lymph node enlargement. Neurologic: (-) numbness, (-) tingling. Psychiatric: (-) anxiety, (-) depression   Current Medication: Outpatient Encounter Medications as of 09/02/2019  Medication Sig Note  . Calcium Carbonate-Vitamin D 600-200 MG-UNIT TABS Take by mouth.   Marland Kitchen CALCIUM-VITAMIN D PO Take 1 tablet by mouth daily.   Marland Kitchen levothyroxine (SYNTHROID) 50 MCG tablet Take 1 tablet (50 mcg total) by mouth daily.   . metoprolol tartrate (LOPRESSOR) 25 MG tablet Take 1 tablet (25 mg total) by mouth 2 (two) times daily.   . phenytoin (DILANTIN) 100 MG ER capsule TAKE 2 CAPSULES (200 MG TOTAL) BY MOUTH 2 (TWO) TIMES DAILY. 01/02/2015: Received from: External Pharmacy  . polyethylene glycol (MIRALAX / GLYCOLAX) packet 1 POWDER PACK IN 12 OZ WATER DAILY FOR CONSTIPATION   . [DISCONTINUED] deferasirox (EXJADE) 250 MG disintegrating tablet Take 1 tablet (250 mg  total) by mouth daily before breakfast. (Patient not taking: Reported on 03/19/2019)   . [DISCONTINUED] traMADol (ULTRAM) 50 MG tablet Take 1 tablet (50 mg total) by mouth every 6 (six) hours as needed for moderate pain.    Facility-Administered Encounter Medications as of 09/02/2019  Medication  . sodium chloride flush (NS) 0.9 % injection 10 mL  . sodium chloride flush (NS) 0.9 % injection 10 mL    Surgical History: Past Surgical History:  Procedure Laterality Date  . REDUCTION MAMMAPLASTY Bilateral 1993  . VP STUNT HEAD SURGERYOther]  2000    Medical History: Past Medical History:  Diagnosis Date  . CPAP (continuous positive airway pressure) dependence   . Hereditary hemochromatosis ([place])   . Hypertension   . Seizures ([place])   . Sleep apnea   . Spina bifida ([place])   . Thyroid disease    hypothyroidism    Family History: Family History  Problem Relation Age of Onset  . Ovarian cancer Maternal Aunt   . Hypertension Father   . CAD Father   . Arthritis Sister   . CAD Paternal Grandfather   . Breast cancer Maternal Grandmother     Social History: Social History   Socioeconomic History  . Marital status: Single    Spouse name: Not on file  . Number of children: Not on file  . Years of education: Not on file  . Highest education level: Not on file  Occupational History  . Not on file  Tobacco Use  . Smoking status: Never Smoker  . Smokeless tobacco: Never Used  Substance and Sexual Activity  . Alcohol use: No  .  Drug use: No  . Sexual activity: Not on file  Other Topics Concern  . Not on file  Social History Narrative  . Not on file   Social Determinants of Health   Financial Resource Strain:   . Difficulty of Paying Living Expenses:   Food Insecurity:   . Worried About Charity fundraiser in the Last Year:   . Arboriculturist in the Last Year:   Transportation Needs:   . Film/video editor (Medical):   Marland Kitchen Lack of Transportation (Non-Medical):    Physical Activity:   . Days of Exercise per Week:   . Minutes of Exercise per Session:   Stress:   . Feeling of Stress :   Social Connections:   . Frequency of Communication with Friends and Family:   . Frequency of Social Gatherings with Friends and Family:   . Attends Religious Services:   . Active Member of Clubs or Organizations:   . Attends Archivist Meetings:   Marland Kitchen Marital Status:   Intimate Partner Violence:   . Fear of Current or Ex-Partner:   . Emotionally Abused:   Marland Kitchen Physically Abused:   . Sexually Abused:     Vital Signs: Blood pressure 125/65, pulse 65, temperature 97.7 F (36.5 C), resp. rate 16, height 4' 7"  (1.397 m), weight 123 lb (55.8 kg), SpO2 100 %.  Examination: General Appearance: The patient is well-developed, well-nourished, and in no distress. Skin: Gross inspection of skin unremarkable. Head: normocephalic, no gross deformities. Eyes: no gross deformities noted. ENT: ears appear grossly normal no exudates. Neck: Supple. No thyromegaly. No LAD. Respiratory: clear bilaterally. Cardiovascular: Normal S1 and S2 without murmur or rub. Extremities: No cyanosis. pulses are equal. Neurologic: Alert and oriented. No involuntary movements.  LABS: Recent Results (from the past 2160 hour(s))  Iron and TIBC     Status: Abnormal   Collection Time: 07/08/19  2:33 PM  Result Value Ref Range   Iron 176 (H) 28 - 170 ug/dL   TIBC 193 (L) 250 - 450 ug/dL   Saturation Ratios 91 (H) 10.4 - 31.8 %   UIBC 17 ug/dL    Comment: Performed at [facility], [address] [place], [place] [address]  AFP tumor marker     Status: None   Collection Time: 07/08/19  2:33 PM  Result Value Ref Range   AFP, Serum, Tumor Marker 2.2 0.0 - 8.3 ng/mL    Comment: (NOTE) Roche Diagnostics Electrochemiluminescence Immunoassay (ECLIA) Values obtained with different assay methods or kits cannot be used interchangeably.  Results cannot be interpreted as  absolute evidence of the presence or absence of malignant disease. This test is not interpretable in pregnant females. Performed At: [facility] [place], [place] [id number] Rush Farmer MD WV:[id number]   Comprehensive metabolic panel     Status: Abnormal   Collection Time: 07/08/19  2:33 PM  Result Value Ref Range   Sodium 137 135 - 145 mmol/L   Potassium 4.1 3.5 - 5.1 mmol/L   Chloride 102 98 - 111 mmol/L   CO2 26 22 - 32 mmol/L   Glucose, Bld 159 (H) 70 - 99 mg/dL    Comment: Glucose reference range applies only to samples taken after fasting for at least 8 hours.   BUN 22 (H) 6 - 20 mg/dL   Creatinine, Ser 0.58 0.44 - 1.00 mg/dL   Calcium 8.7 (L) 8.9 - 10.3 mg/dL   Total Protein 7.2 6.5 -  8.1 g/dL   Albumin 3.9 3.5 - 5.0 g/dL   AST 22 15 - 41 U/L   ALT 25 0 - 44 U/L   Alkaline Phosphatase 57 38 - 126 U/L   Total Bilirubin 0.3 0.3 - 1.2 mg/dL   GFR calc non Af Amer >60 >60 mL/min   GFR calc Af Amer >60 >60 mL/min   Anion gap 9 5 - 15    Comment: Performed at [facility], [address] [place], [place] [address]  CBC with Differential/Platelet     Status: Abnormal   Collection Time: 07/08/19  2:33 PM  Result Value Ref Range   WBC 6.5 4.0 - 10.5 K/uL   RBC 3.49 (L) 3.87 - 5.11 MIL/uL   Hemoglobin 11.5 (L) 12.0 - 15.0 g/dL   HCT 34.3 (L) 36.0 - 46.0 %   MCV 98.3 80.0 - 100.0 fL   MCH 33.0 26.0 - 34.0 pg   MCHC 33.5 30.0 - 36.0 g/dL   RDW 12.0 11.5 - 15.5 %   Platelets 245 150 - 400 K/uL   nRBC 0.0 0.0 - 0.2 %   Neutrophils Relative % 57 %   Neutro Abs 3.7 1.7 - 7.7 K/uL   Lymphocytes Relative 32 %   Lymphs Abs 2.1 0.7 - 4.0 K/uL   Monocytes Relative 8 %   Monocytes Absolute 0.6 0.1 - 1.0 K/uL   Eosinophils Relative 2 %   Eosinophils Absolute 0.2 0.0 - 0.5 K/uL   Basophils Relative 1 %   Basophils Absolute 0.0 0.0 - 0.1 K/uL   Immature Granulocytes 0 %   Abs Immature Granulocytes 0.01 0.00 - 0.07 K/uL    Comment: Performed at  [facility], [address], [place], [place] [address]    Radiology: MM 3D SCREEN BREAST BILATERAL  Result Date: 09/10/2018 CLINICAL DATA:  Screening. EXAM: DIGITAL SCREENING BILATERAL MAMMOGRAM WITH TOMO AND CAD COMPARISON:  Previous exam(s). ACR Breast Density Category b: There are scattered areas of fibroglandular density. FINDINGS: There are no findings suspicious for malignancy. Images were processed with CAD. IMPRESSION: No mammographic evidence of malignancy. A result letter of this screening mammogram will be mailed directly to the patient. RECOMMENDATION: Screening mammogram in one year. (Code:SM-B-01Y) BI-RADS CATEGORY  1: Negative. Electronically Signed   By: Lajean Manes M.D.   On: 09/10/2018 09:58    No results found.  No results found.    Assessment and Plan: Patient Active Problem List   Diagnosis Date Noted  . Dysuria 10/01/2018  . Acquired hypothyroidism 06/12/2018  . Routine cervical smear 06/12/2018  . Needs flu shot 02/04/2018  . Essential hypertension 05/31/2017  . Sleep apnea 05/31/2017  . Spina bifida ([employer]) 05/31/2017  . Hemochromatosis 11/28/2014  . Word finding difficulty 02/28/2013  . Seizure disorder ([place]) 02/04/2013  . Neurogenic bladder 02/04/2013  . Left ankle sprain 09/29/2011  . Left leg pain 09/29/2011    1. OSA on CPAP Continue cpap therapy as directed.  Patient has excellent compliance and results with therapy.   2. Essential hypertension Controlled, continue to follow pcp management.   3. Seizure disorder ([place]) No seizures in 20 years.  She continues to take medications and denies any issues.   General Counseling: I have discussed the findings of the evaluation and examination with Tarina.  I have also discussed any further diagnostic evaluation thatmay be needed or ordered today. Reina verbalizes understanding of the findings of todays visit. We also reviewed her medications today and discussed drug  interactions and side effects  including but not limited excessive drowsiness and altered mental states. We also discussed that there is always a risk not just to her but also people around her. she has been encouraged to call the office with any questions or concerns that should arise related to todays visit.  No orders of the defined types were placed in this encounter.    Time spent: 25 This patient was seen by Orson Gear AGNP-C in Collaboration with Dr. Devona Konig as a part of collaborative care agreement.    I have personally obtained a history, examined the patient, evaluated laboratory and imaging results, formulated the assessment and plan and placed orders.    Allyne Gee, MD [facility] Pulmonary and Critical Care Sleep medicine

## 2019-09-12 ENCOUNTER — Other Ambulatory Visit: Payer: Self-pay

## 2019-09-12 DIAGNOSIS — E039 Hypothyroidism, unspecified: Secondary | ICD-10-CM

## 2019-09-12 MED ORDER — LEVOTHYROXINE SODIUM 50 MCG PO TABS: 50.0000 ug | ORAL_TABLET | Freq: Every day | ORAL | 3 refills | Status: DC

## 2019-09-17 ENCOUNTER — Telehealth: Payer: Self-pay

## 2019-09-17 NOTE — Telephone Encounter (Signed)
No vm set up was unable to lmom to confirm for 09-19-19 ov.

## 2019-09-17 NOTE — Telephone Encounter (Signed)
Confirmed appointment on 09/19/2019 and screened for covid. klh

## 2019-09-19 ENCOUNTER — Encounter: Payer: Self-pay | Admitting: Nurse Practitioner

## 2019-09-19 ENCOUNTER — Ambulatory Visit (INDEPENDENT_AMBULATORY_CARE_PROVIDER_SITE_OTHER): Payer: PRIVATE HEALTH INSURANCE | Admitting: Nurse Practitioner

## 2019-09-19 ENCOUNTER — Other Ambulatory Visit: Payer: Self-pay

## 2019-09-19 VITALS — BP 131/68 | HR 70 | Temp 97.5°F | Resp 16 | Ht <= 58 in | Wt 123.0 lb

## 2019-09-19 DIAGNOSIS — Z0001 Encounter for general adult medical examination with abnormal findings: Secondary | ICD-10-CM | POA: Diagnosis not present

## 2019-09-19 DIAGNOSIS — E039 Hypothyroidism, unspecified: Secondary | ICD-10-CM | POA: Diagnosis not present

## 2019-09-19 DIAGNOSIS — G40909 Epilepsy, unspecified, not intractable, without status epilepticus: Secondary | ICD-10-CM

## 2019-09-19 DIAGNOSIS — Z1231 Encounter for screening mammogram for malignant neoplasm of breast: Secondary | ICD-10-CM | POA: Diagnosis not present

## 2019-09-19 DIAGNOSIS — R300 Dysuria: Secondary | ICD-10-CM

## 2019-09-19 NOTE — Progress Notes (Signed)
[facility] [place], [place] [address]  Internal MEDICINE  Office Visit Note  Patient Name: Christina Stephens  [id number]  [id number]  Date of Service: 09/25/2019   Pt is here for routine health maintenance examination  Chief Complaint  Patient presents with  . Annual Exam  . Hypertension  . Hypothyroidism     The patient is here for annual health maintenance exam. She states that she is doing well without concerns or complaints. She is due to have routine, fasting labs done. Should also check dilantin level. She is due to have screening mammogram as well.     Current Medication: Outpatient Encounter Medications as of 09/19/2019  Medication Sig Note  . Calcium Carbonate-Vitamin D 600-200 MG-UNIT TABS Take by mouth.   Marland Kitchen CALCIUM-VITAMIN D PO Take 1 tablet by mouth daily.   Marland Kitchen levothyroxine (SYNTHROID) 50 MCG tablet Take 1 tablet (50 mcg total) by mouth daily.   . phenytoin (DILANTIN) 100 MG ER capsule TAKE 2 CAPSULES (200 MG TOTAL) BY MOUTH 2 (TWO) TIMES DAILY. 01/02/2015: Received from: External Pharmacy  . polyethylene glycol (MIRALAX / GLYCOLAX) packet 1 POWDER PACK IN 12 OZ WATER DAILY FOR CONSTIPATION   . [DISCONTINUED] metoprolol tartrate (LOPRESSOR) 25 MG tablet Take 1 tablet (25 mg total) by mouth 2 (two) times daily.    Facility-Administered Encounter Medications as of 09/19/2019  Medication  . sodium chloride flush (NS) 0.9 % injection 10 mL  . sodium chloride flush (NS) 0.9 % injection 10 mL    Surgical History: Past Surgical History:  Procedure Laterality Date  . REDUCTION MAMMAPLASTY Bilateral 1993  . VP STUNT HEAD SURGERYOther]  2000    Medical History: Past Medical History:  Diagnosis Date  . CPAP (continuous positive airway pressure) dependence   . Hereditary hemochromatosis ([place])   . Hypertension   . Seizures ([place])   . Sleep apnea   . Spina bifida ([place])   . Thyroid disease    hypothyroidism    Family History: Family  History  Problem Relation Age of Onset  . Ovarian cancer Maternal Aunt   . Hypertension Father   . CAD Father   . Arthritis Sister   . CAD Paternal Grandfather   . Breast cancer Maternal Grandmother       Review of Systems  Constitutional: Negative for activity change, chills, fatigue and unexpected weight change.  HENT: Negative for congestion, postnasal drip, rhinorrhea, sneezing and sore throat.   Respiratory: Negative for apnea, cough, chest tightness, shortness of breath and wheezing.   Cardiovascular: Negative for chest pain and palpitations.  Gastrointestinal: Negative for abdominal pain, constipation, diarrhea, nausea and vomiting.  Endocrine: Negative for cold intolerance, heat intolerance, polydipsia and polyuria.       Stable hypothyroid  Musculoskeletal: Positive for arthralgias and gait problem. Negative for back pain, joint swelling and neck pain.  Skin: Negative for rash.  Allergic/Immunologic: Negative for environmental allergies.  Neurological: Positive for seizures. Negative for dizziness, tremors, numbness and headaches.  Hematological: Negative for adenopathy. Does not bruise/bleed easily.  Psychiatric/Behavioral: Negative for behavioral problems (Depression), sleep disturbance and suicidal ideas. The patient is not nervous/anxious.      Today's Vitals   09/19/19 0950  BP: 131/68  Pulse: 70  Resp: 16  Temp: (!) 97.5 F (36.4 C)  SpO2: 100%  Height: 4' 7"  (1.397 m)   Body mass index is 28.59 kg/m.  Physical Exam Vitals and nursing note reviewed.  Constitutional:  General: She is not in acute distress.    Appearance: Normal appearance. She is well-developed. She is not diaphoretic.  HENT:     Head: Normocephalic and atraumatic.     Nose: Nose normal.     Mouth/Throat:     Pharynx: No oropharyngeal exudate.  Eyes:     Conjunctiva/sclera: Conjunctivae normal.     Pupils: Pupils are equal, round, and reactive to light.  Neck:     Thyroid: No  thyromegaly.     Vascular: No JVD.     Trachea: No tracheal deviation.  Cardiovascular:     Rate and Rhythm: Normal rate and regular rhythm.     Pulses: Normal pulses.     Heart sounds: Normal heart sounds. No murmur. No friction rub. No gallop.   Pulmonary:     Effort: Pulmonary effort is normal. No respiratory distress.     Breath sounds: Normal breath sounds. No wheezing or rales.  Chest:     Chest wall: No tenderness.  Abdominal:     General: Bowel sounds are normal.     Palpations: Abdomen is soft.     Tenderness: There is no abdominal tenderness.  Musculoskeletal:        General: Normal range of motion.     Cervical back: Normal range of motion and neck supple.  Lymphadenopathy:     Cervical: No cervical adenopathy.  Skin:    General: Skin is warm and dry.  Neurological:     Mental Status: She is alert and oriented to person, place, and time. Mental status is at baseline.     Cranial Nerves: No cranial nerve deficit.     Comments: Patient is at her neurological baseline.   Psychiatric:        Mood and Affect: Mood normal.        Behavior: Behavior normal.        Thought Content: Thought content normal.        Judgment: Judgment normal.    Depression screen [facility] 2/9 09/19/2019 03/19/2019 09/13/2018 06/04/2018 01/26/2018  Decreased Interest 0 0 0 0 0  Down, Depressed, Hopeless 0 0 0 0 0  PHQ - 2 Score 0 0 0 0 0    Functional Status Survey: Is the patient deaf or have difficulty hearing?: No Does the patient have difficulty seeing, even when wearing glasses/contacts?: No Does the patient have difficulty concentrating, remembering, or making decisions?: No Does the patient have difficulty walking or climbing stairs?: Yes Does the patient have difficulty dressing or bathing?: Yes Does the patient have difficulty doing errands alone such as visiting a doctor's office or shopping?: No  MMSE - [place] Exam 09/13/2018  Orientation to time 5  Orientation to Place 5   Registration 3  Attention/ Calculation 5  Recall 3  Language- name 2 objects 2  Language- repeat 1  Language- follow 3 step command 3  Language- read & follow direction 1  Write a sentence 1  Copy design 1  Total score 30    Fall Risk  09/19/2019 03/19/2019 09/13/2018 06/04/2018 01/26/2018  Falls in the past year? 0 0 0 - No  Number falls in past yr: - - - 0 -  Injury with Fall? - - - 0 -    LABS: Recent Results (from the past 2160 hour(s))  Iron and TIBC     Status: Abnormal   Collection Time: 07/08/19  2:33 PM  Result Value Ref Range   Iron 176 (H) 28 -  170 ug/dL   TIBC 193 (L) 250 - 450 ug/dL   Saturation Ratios 91 (H) 10.4 - 31.8 %   UIBC 17 ug/dL    Comment: Performed at [facility], [address] [place], [place] [address]  AFP tumor marker     Status: None   Collection Time: 07/08/19  2:33 PM  Result Value Ref Range   AFP, Serum, Tumor Marker 2.2 0.0 - 8.3 ng/mL    Comment: (NOTE) Roche Diagnostics Electrochemiluminescence Immunoassay (ECLIA) Values obtained with different assay methods or kits cannot be used interchangeably.  Results cannot be interpreted as absolute evidence of the presence or absence of malignant disease. This test is not interpretable in pregnant females. Performed At: [facility] [place], [place] [id number] Rush Farmer MD IZ:[id number]   Comprehensive metabolic panel     Status: Abnormal   Collection Time: 07/08/19  2:33 PM  Result Value Ref Range   Sodium 137 135 - 145 mmol/L   Potassium 4.1 3.5 - 5.1 mmol/L   Chloride 102 98 - 111 mmol/L   CO2 26 22 - 32 mmol/L   Glucose, Bld 159 (H) 70 - 99 mg/dL    Comment: Glucose reference range applies only to samples taken after fasting for at least 8 hours.   BUN 22 (H) 6 - 20 mg/dL   Creatinine, Ser 0.58 0.44 - 1.00 mg/dL   Calcium 8.7 (L) 8.9 - 10.3 mg/dL   Total Protein 7.2 6.5 - 8.1 g/dL   Albumin 3.9 3.5 - 5.0 g/dL   AST 22 15 - 41 U/L   ALT 25 0 -  44 U/L   Alkaline Phosphatase 57 38 - 126 U/L   Total Bilirubin 0.3 0.3 - 1.2 mg/dL   GFR calc non Af Amer >60 >60 mL/min   GFR calc Af Amer >60 >60 mL/min   Anion gap 9 5 - 15    Comment: Performed at [facility], [address] [place], [place] [address]  CBC with Differential/Platelet     Status: Abnormal   Collection Time: 07/08/19  2:33 PM  Result Value Ref Range   WBC 6.5 4.0 - 10.5 K/uL   RBC 3.49 (L) 3.87 - 5.11 MIL/uL   Hemoglobin 11.5 (L) 12.0 - 15.0 g/dL   HCT 34.3 (L) 36.0 - 46.0 %   MCV 98.3 80.0 - 100.0 fL   MCH 33.0 26.0 - 34.0 pg   MCHC 33.5 30.0 - 36.0 g/dL   RDW 12.0 11.5 - 15.5 %   Platelets 245 150 - 400 K/uL   nRBC 0.0 0.0 - 0.2 %   Neutrophils Relative % 57 %   Neutro Abs 3.7 1.7 - 7.7 K/uL   Lymphocytes Relative 32 %   Lymphs Abs 2.1 0.7 - 4.0 K/uL   Monocytes Relative 8 %   Monocytes Absolute 0.6 0.1 - 1.0 K/uL   Eosinophils Relative 2 %   Eosinophils Absolute 0.2 0.0 - 0.5 K/uL   Basophils Relative 1 %   Basophils Absolute 0.0 0.0 - 0.1 K/uL   Immature Granulocytes 0 %   Abs Immature Granulocytes 0.01 0.00 - 0.07 K/uL    Comment: Performed at [facility], [address] [place], [place] [address]   Assessment/Plan: 1. Encounter for general adult medical examination with abnormal findings Annual health maintenance exam today.   2. Acquired hypothyroidism Stable. Continue levothyroxine as prescribed   3. Seizure disorder ([place]) Check dilantin level and adjust dosing as indicated.  4. Encounter for screening mammogram for malignant neoplasm of breast - MM DIGITAL SCREENING BILATERAL; Future  5. Dysuria U/a today  General Counseling: Seline verbalizes understanding of the findings of todays visit and agrees with plan of treatment. I have discussed any further diagnostic evaluation that may be needed or ordered today. We also reviewed her medications today. she has been encouraged to call the office with any questions or concerns  that should arise related to todays visit.    Counseling:  This patient was seen by Leretha Pol FNP Collaboration with Dr [name] as a part of collaborative care agreement  Orders Placed This Encounter  Procedures  . MM DIGITAL SCREENING BILATERAL     Total time spent: 27 Minutes  Time spent includes review of chart, medications, test results, and follow up plan with the patient.     [name], MD  Internal Medicine

## 2019-09-23 ENCOUNTER — Other Ambulatory Visit: Payer: Self-pay

## 2019-09-23 DIAGNOSIS — I10 Essential (primary) hypertension: Secondary | ICD-10-CM

## 2019-09-23 MED ORDER — METOPROLOL TARTRATE 25 MG PO TABS: 25.0000 mg | ORAL_TABLET | Freq: Two times a day (BID) | ORAL | 2 refills | Status: DC

## 2019-09-25 DIAGNOSIS — Z1231 Encounter for screening mammogram for malignant neoplasm of breast: Secondary | ICD-10-CM | POA: Insufficient documentation

## 2019-09-25 DIAGNOSIS — Z0001 Encounter for general adult medical examination with abnormal findings: Secondary | ICD-10-CM | POA: Insufficient documentation

## 2019-10-08 ENCOUNTER — Other Ambulatory Visit: Payer: Self-pay | Admitting: *Deleted

## 2019-10-08 ENCOUNTER — Other Ambulatory Visit: Payer: Self-pay

## 2019-10-08 ENCOUNTER — Telehealth: Payer: Self-pay | Admitting: Oncology

## 2019-10-08 DIAGNOSIS — Z13220 Encounter for screening for lipoid disorders: Secondary | ICD-10-CM

## 2019-10-08 DIAGNOSIS — Z5181 Encounter for therapeutic drug level monitoring: Secondary | ICD-10-CM

## 2019-10-08 DIAGNOSIS — E039 Hypothyroidism, unspecified: Secondary | ICD-10-CM

## 2019-10-08 DIAGNOSIS — E785 Hyperlipidemia, unspecified: Secondary | ICD-10-CM

## 2019-10-08 DIAGNOSIS — E559 Vitamin D deficiency, unspecified: Secondary | ICD-10-CM

## 2019-10-08 NOTE — Telephone Encounter (Signed)
Patient phoned on this date and asked if the [facility] could do patient's lab work that PCP had requested when patient next comes to the [facility] for labs. Message sent to nursing team regarding this.

## 2019-10-08 NOTE — Telephone Encounter (Signed)
I contacted Dr. [name] office to ask what labs they needed and LVM. Patient will bring prescription with lab orders as well to her lab appt on 6/8.

## 2019-10-09 NOTE — Telephone Encounter (Signed)
Orders were faxed over to [facility] yesterday concerning labs. Orders have been placed for future labs.

## 2019-10-10 ENCOUNTER — Ambulatory Visit
Admission: RE | Admit: 2019-10-10 | Discharge: 2019-10-10 | Disposition: A | Discharge status: Home / Self Care | Source: Ambulatory Visit | Attending: Nurse Practitioner | Admitting: Nurse Practitioner

## 2019-10-10 DIAGNOSIS — Z1231 Encounter for screening mammogram for malignant neoplasm of breast: Secondary | ICD-10-CM | POA: Insufficient documentation

## 2019-10-11 NOTE — Progress Notes (Signed)
Negative mammogram

## 2019-10-15 ENCOUNTER — Inpatient Hospital Stay: Attending: Oncology

## 2019-10-15 ENCOUNTER — Other Ambulatory Visit: Payer: Self-pay

## 2019-10-15 DIAGNOSIS — Z452 Encounter for adjustment and management of vascular access device: Secondary | ICD-10-CM | POA: Insufficient documentation

## 2019-10-15 DIAGNOSIS — Z5181 Encounter for therapeutic drug level monitoring: Secondary | ICD-10-CM

## 2019-10-15 DIAGNOSIS — Z95828 Presence of other vascular implants and grafts: Secondary | ICD-10-CM

## 2019-10-15 DIAGNOSIS — Z13220 Encounter for screening for lipoid disorders: Secondary | ICD-10-CM

## 2019-10-15 DIAGNOSIS — E559 Vitamin D deficiency, unspecified: Secondary | ICD-10-CM

## 2019-10-15 DIAGNOSIS — E039 Hypothyroidism, unspecified: Secondary | ICD-10-CM

## 2019-10-15 LAB — CBC WITH DIFFERENTIAL/PLATELET
Abs Immature Granulocytes: 0.01 10*3/uL (ref 0.00–0.07)
Basophils Absolute: 0 10*3/uL (ref 0.0–0.1)
Basophils Relative: 1 %
Eosinophils Absolute: 0.2 10*3/uL (ref 0.0–0.5)
Eosinophils Relative: 4 %
HCT: 33.6 % — ABNORMAL LOW (ref 36.0–46.0)
Hemoglobin: 11.7 g/dL — ABNORMAL LOW (ref 12.0–15.0)
Immature Granulocytes: 0 %
Lymphocytes Relative: 32 %
Lymphs Abs: 1.9 10*3/uL (ref 0.7–4.0)
MCH: 33.3 pg (ref 26.0–34.0)
MCHC: 34.8 g/dL (ref 30.0–36.0)
MCV: 95.7 fL (ref 80.0–100.0)
Monocytes Absolute: 0.6 10*3/uL (ref 0.1–1.0)
Monocytes Relative: 10 %
Neutro Abs: 3.1 10*3/uL (ref 1.7–7.7)
Neutrophils Relative %: 53 %
Platelets: 239 10*3/uL (ref 150–400)
RBC: 3.51 MIL/uL — ABNORMAL LOW (ref 3.87–5.11)
RDW: 12.4 % (ref 11.5–15.5)
WBC: 5.9 10*3/uL (ref 4.0–10.5)
nRBC: 0 % (ref 0.0–0.2)

## 2019-10-15 LAB — IRON AND TIBC
Iron: 174 ug/dL — ABNORMAL HIGH (ref 28–170)
Saturation Ratios: 93 % — ABNORMAL HIGH (ref 10.4–31.8)
TIBC: 188 ug/dL — ABNORMAL LOW (ref 250–450)
UIBC: 14 ug/dL

## 2019-10-15 LAB — LIPID PANEL
Cholesterol: 130 mg/dL (ref 0–200)
HDL: 49 mg/dL (ref >40)
LDL Cholesterol: 68 mg/dL (ref 0–99)
Total CHOL/HDL Ratio: 2.7 RATIO
Triglycerides: 67 mg/dL (ref <150)
VLDL: 13 mg/dL (ref 0–40)

## 2019-10-15 LAB — VITAMIN D 25 HYDROXY (VIT D DEFICIENCY, FRACTURES): Vit D, 25-Hydroxy: 38.51 ng/mL (ref 30–100)

## 2019-10-15 LAB — TSH: TSH: 0.203 u[IU]/mL — ABNORMAL LOW (ref 0.350–4.500)

## 2019-10-15 LAB — FERRITIN: Ferritin: 55 ng/mL (ref 11–307)

## 2019-10-15 LAB — T4, FREE: Free T4: 0.94 ng/dL (ref 0.61–1.12)

## 2019-10-15 MED ORDER — HEPARIN SOD (PORK) LOCK FLUSH 100 UNIT/ML IV SOLN
500.0000 [IU] | Freq: Once | INTRAVENOUS | Status: AC
  Administered 2019-10-15: 500 [IU] via INTRAVENOUS
  Filled 2019-10-15: qty 5

## 2019-10-15 MED ORDER — SODIUM CHLORIDE 0.9% FLUSH
10.0000 mL | Freq: Once | INTRAVENOUS | Status: AC
  Administered 2019-10-15: 10 mL via INTRAVENOUS
  Filled 2019-10-15: qty 10

## 2019-10-16 LAB — PHENYTOIN LEVEL, FREE AND TOTAL
Phenytoin, Free: 0.9 ug/mL — ABNORMAL LOW (ref 1.0–2.0)
Phenytoin, Total: 13.3 ug/mL (ref 10.0–20.0)

## 2019-10-21 ENCOUNTER — Other Ambulatory Visit: Payer: Self-pay | Admitting: Nurse Practitioner

## 2019-10-22 LAB — CBC WITH DIFFERENTIAL/PLATELET
Basophils Absolute: 0 10*3/uL (ref 0.0–0.2)
Basos: 1 %
EOS (ABSOLUTE): 0.2 10*3/uL (ref 0.0–0.4)
Eos: 4 %
Hematocrit: 34.7 % (ref 34.0–46.6)
Hemoglobin: 11.9 g/dL (ref 11.1–15.9)
Immature Grans (Abs): 0 10*3/uL (ref 0.0–0.1)
Immature Granulocytes: 0 %
Lymphocytes Absolute: 2.2 10*3/uL (ref 0.7–3.1)
Lymphs: 37 %
MCH: 32.7 pg (ref 26.6–33.0)
MCHC: 34.3 g/dL (ref 31.5–35.7)
MCV: 95 fL (ref 79–97)
Monocytes Absolute: 0.5 10*3/uL (ref 0.1–0.9)
Monocytes: 9 %
Neutrophils Absolute: 3 10*3/uL (ref 1.4–7.0)
Neutrophils: 49 %
Platelets: 280 10*3/uL (ref 150–450)
RBC: 3.64 x10E6/uL — ABNORMAL LOW (ref 3.77–5.28)
RDW: 11.5 % — ABNORMAL LOW (ref 11.7–15.4)
WBC: 6 10*3/uL (ref 3.4–10.8)

## 2019-10-22 LAB — TSH+FREE T4
Free T4: 1.25 ng/dL (ref 0.82–1.77)
TSH: 0.239 u[IU]/mL — ABNORMAL LOW (ref 0.450–4.500)

## 2019-10-22 LAB — COMPREHENSIVE METABOLIC PANEL
ALT: 17 IU/L (ref 0–32)
AST: 15 IU/L (ref 0–40)
Albumin/Globulin Ratio: 1.5 (ref 1.2–2.2)
Albumin: 4.2 g/dL (ref 3.8–4.8)
Alkaline Phosphatase: 78 IU/L (ref 48–121)
BUN/Creatinine Ratio: 27 — ABNORMAL HIGH (ref 9–23)
BUN: 17 mg/dL (ref 6–24)
Bilirubin Total: <0.2 mg/dL (ref 0.0–1.2)
CO2: 23 mmol/L (ref 20–29)
Calcium: 9.4 mg/dL (ref 8.7–10.2)
Chloride: 100 mmol/L (ref 96–106)
Creatinine, Ser: 0.64 mg/dL (ref 0.57–1.00)
GFR calc Af Amer: 126 mL/min/{1.73_m2} (ref >59)
GFR calc non Af Amer: 110 mL/min/{1.73_m2} (ref >59)
Globulin, Total: 2.8 g/dL (ref 1.5–4.5)
Glucose: 150 mg/dL — ABNORMAL HIGH (ref 65–99)
Potassium: 4.2 mmol/L (ref 3.5–5.2)
Sodium: 137 mmol/L (ref 134–144)
Total Protein: 7 g/dL (ref 6.0–8.5)

## 2019-10-22 LAB — LIPID PANEL W/O CHOL/HDL RATIO
Cholesterol, Total: 138 mg/dL (ref 100–199)
HDL: 54 mg/dL (ref >39)
LDL Chol Calc (NIH): 72 mg/dL (ref 0–99)
Triglycerides: 56 mg/dL (ref 0–149)
VLDL Cholesterol Cal: 12 mg/dL (ref 5–40)

## 2019-10-22 LAB — VITAMIN D 25 HYDROXY (VIT D DEFICIENCY, FRACTURES): Vit D, 25-Hydroxy: 36.1 ng/mL (ref 30.0–100.0)

## 2019-10-22 LAB — PHENYTOIN LEVEL, TOTAL: Phenytoin (Dilantin), Serum: 10.8 ug/mL (ref 10.0–20.0)

## 2019-12-11 ENCOUNTER — Other Ambulatory Visit: Payer: Self-pay

## 2019-12-11 ENCOUNTER — Ambulatory Visit (INDEPENDENT_AMBULATORY_CARE_PROVIDER_SITE_OTHER): Payer: PRIVATE HEALTH INSURANCE

## 2019-12-11 DIAGNOSIS — G4733 Obstructive sleep apnea (adult) (pediatric): Secondary | ICD-10-CM | POA: Diagnosis not present

## 2019-12-11 NOTE — Progress Notes (Signed)
95 percentile pressure 11    95th percentile leak 0.1   apnea index 6.6 /hr  apnea-hypopnea index  12.2 /hr   total days used  >4 hr 90 days  total days used <4 hr 0 days  Total compliance 100 percent  She is doing great on cpap last visit we increases pressure to 6-14 cmh2o and it has brought her AHI from 19.8 to 12.2. She is not having issues wearing cpap.

## 2020-01-11 NOTE — Progress Notes (Signed)
[place]  Telephone:(336) [phone] Fax:(336) [phone]  ID: Christina Stephens OB: 1975/08/08  MR#: [id number]  DYJ#:[id number]  Patient Care Team: [name], MD as PCP - General (Internal Medicine) Lloyd Huger, MD as Consulting Physician (Oncology)  CHIEF COMPLAINT: Hereditary hemochromatosis.  INTERVAL HISTORY: Patient returns to clinic today for repeat laboratory work, further evaluation, and consideration of phlebotomy or reinitiating Exjade.  She continues to feel well and at her baseline.  She has no neurologic complaints.  She denies any recent fevers or illnesses.  She has a good appetite and denies weight loss.  She denies any chest pain, shortness of breath, cough, or hemoptysis.  She denies any nausea, vomiting, constipation, or diarrhea.  She has no urinary complaints.  Patient offers no specific complaints today.  REVIEW OF SYSTEMS:   Review of Systems  Constitutional: Negative.  Negative for fever, malaise/fatigue and weight loss.  Respiratory: Negative.  Negative for cough, hemoptysis and shortness of breath.   Cardiovascular: Negative.  Negative for chest pain and leg swelling.  Gastrointestinal: Negative.  Negative for abdominal pain.  Genitourinary: Negative.  Negative for dysuria.  Musculoskeletal: Negative.  Negative for back pain.  Skin: Negative.  Negative for rash.  Neurological: Negative.  Negative for dizziness, focal weakness and weakness.  Psychiatric/Behavioral: Negative.  The patient is not nervous/anxious.     As per HPI. Otherwise, a complete review of systems is negative.  PAST MEDICAL HISTORY: Past Medical History:  Diagnosis Date  . CPAP (continuous positive airway pressure) dependence   . Hereditary hemochromatosis ([place])   . Hypertension   . Seizures ([place])   . Sleep apnea   . Spina bifida ([place])   . Thyroid disease    hypothyroidism    PAST SURGICAL HISTORY: Past Surgical History:  Procedure Laterality Date  .  REDUCTION MAMMAPLASTY Bilateral 1993  . VP STUNT HEAD SURGERYOther]  2000    FAMILY HISTORY: Family History  Problem Relation Age of Onset  . Ovarian cancer Maternal Aunt   . Hypertension Father   . CAD Father   . Arthritis Sister   . CAD Paternal Grandfather   . Breast cancer Maternal Grandmother     ADVANCED DIRECTIVES (Y/N):  N  HEALTH MAINTENANCE: Social History   Tobacco Use  . Smoking status: Never Smoker  . Smokeless tobacco: Never Used  Substance Use Topics  . Alcohol use: No  . Drug use: No     Colonoscopy:  PAP:  Bone density:  Lipid panel:  Allergies  Allergen Reactions  . Latex     Pt has Spina bifada and was told that if she has surgery she should avoid latex for poss. Reaction/infection.    Current Outpatient Medications  Medication Sig Dispense Refill  . levothyroxine (SYNTHROID) 50 MCG tablet Take 1 tablet (50 mcg total) by mouth daily. 90 tablet 3  . metoprolol tartrate (LOPRESSOR) 25 MG tablet Take 1 tablet (25 mg total) by mouth 2 (two) times daily. 60 tablet 2  . phenytoin (DILANTIN) 100 MG ER capsule TAKE 2 CAPSULES (200 MG TOTAL) BY MOUTH 2 (TWO) TIMES DAILY.  11  . polyethylene glycol (MIRALAX / GLYCOLAX) packet 1 POWDER PACK IN 12 OZ WATER DAILY FOR CONSTIPATION    . Calcium Carbonate-Vitamin D 600-200 MG-UNIT TABS Take by mouth.    Marland Kitchen CALCIUM-VITAMIN D PO Take 1 tablet by mouth daily.     No current facility-administered medications for this visit.   Facility-Administered Medications Ordered in Other Visits  Medication Dose Route Frequency Provider Last Rate Last Admin  . sodium chloride flush (NS) 0.9 % injection 10 mL  10 mL Intravenous PRN Cammie Sickle, MD   10 mL at 02/26/16 0917  . sodium chloride flush (NS) 0.9 % injection 10 mL  10 mL Intravenous PRN Nolon Stalls C, MD   10 mL at 01/01/18 1108    OBJECTIVE: Vitals:   01/15/20 1059  BP: 139/75  Pulse: 72  Resp: 16  Temp: 98.7 F (37.1 C)     Body mass index is  28.59 kg/m.    ECOG FS:0 - Asymptomatic  General: Well-developed, well-nourished, no acute distress. Eyes: Pink conjunctiva, anicteric sclera. HEENT: Normocephalic, moist mucous membranes. Lungs: No audible wheezing or coughing. Heart: Regular rate and rhythm. Abdomen: Soft, nontender, no obvious distention. Musculoskeletal: No edema, cyanosis, or clubbing. Neuro: Alert, answering all questions appropriately. Cranial nerves grossly intact. Skin: No rashes or petechiae noted. Psych: Normal affect.   LAB RESULTS:  Lab Results  Component Value Date   NA 137 10/21/2019   K 4.2 10/21/2019   CL 100 10/21/2019   CO2 23 10/21/2019   GLUCOSE 150 (H) 10/21/2019   BUN 17 10/21/2019   CREATININE 0.64 10/21/2019   CALCIUM 9.4 10/21/2019   PROT 7.0 10/21/2019   ALBUMIN 4.2 10/21/2019   AST 15 10/21/2019   ALT 17 10/21/2019   ALKPHOS 78 10/21/2019   BILITOT <0.2 10/21/2019   GFRNONAA 110 10/21/2019   GFRAA 126 10/21/2019    Lab Results  Component Value Date   WBC 7.8 01/15/2020   NEUTROABS 3.9 01/15/2020   HGB 12.6 01/15/2020   HCT 36.9 01/15/2020   MCV 97.1 01/15/2020   PLT 295 01/15/2020   Lab Results  Component Value Date   IRON 149 01/15/2020   TIBC 200 (L) 01/15/2020   IRONPCTSAT 74 (H) 01/15/2020   Lab Results  Component Value Date   FERRITIN 48 01/15/2020     STUDIES: No results found.  ASSESSMENT: Hereditary hemochromatosis.   PLAN:    1. Hereditary hemochromatosis: Originally diagnosed in July 2011 with homozygous C282Y mutation.  Previously, patient had a difficult time with phlebotomy and was initiated on Exjade for iron chelation.  She previously was taking 250 mg daily from December 2014 through September 2020.  AFP is normal and unchanged at 2.4.  Her most recent abdominal ultrasound on June 20, 2018 did not reveal any suspicious abnormalities in her liver.  Although patient's iron saturation has improved to 74%, she agreed to return to clinic next  week to attempt phlebotomy.  If this is unsuccessful, will consider reinitiating Exjade.  Follow-up will be arranged after phlebotomy. 2.  Port maintenance: Continue port flushes every 6 to 8 weeks. 3.  Spina bifida: Chronic and unchanged. 4.  Anemia: Resolved.  I spent a total of 30 minutes reviewing chart data, face-to-face evaluation with the patient, counseling and coordination of care as detailed above.    Patient expressed understanding and was in agreement with this plan. She also understands that She can call clinic at any time with any questions, concerns, or complaints.    Lloyd Huger, MD   01/16/2020 4:25 PM

## 2020-01-14 ENCOUNTER — Encounter: Payer: Self-pay | Admitting: Oncology

## 2020-01-15 ENCOUNTER — Inpatient Hospital Stay

## 2020-01-15 ENCOUNTER — Inpatient Hospital Stay: Attending: Oncology

## 2020-01-15 ENCOUNTER — Inpatient Hospital Stay (HOSPITAL_BASED_OUTPATIENT_CLINIC_OR_DEPARTMENT_OTHER): Admitting: Oncology

## 2020-01-15 ENCOUNTER — Other Ambulatory Visit: Payer: Self-pay

## 2020-01-15 DIAGNOSIS — E039 Hypothyroidism, unspecified: Secondary | ICD-10-CM | POA: Diagnosis not present

## 2020-01-15 DIAGNOSIS — Z803 Family history of malignant neoplasm of breast: Secondary | ICD-10-CM | POA: Insufficient documentation

## 2020-01-15 DIAGNOSIS — I10 Essential (primary) hypertension: Secondary | ICD-10-CM | POA: Insufficient documentation

## 2020-01-15 DIAGNOSIS — Z8249 Family history of ischemic heart disease and other diseases of the circulatory system: Secondary | ICD-10-CM | POA: Insufficient documentation

## 2020-01-15 DIAGNOSIS — Z8041 Family history of malignant neoplasm of ovary: Secondary | ICD-10-CM | POA: Insufficient documentation

## 2020-01-15 DIAGNOSIS — Q059 Spina bifida, unspecified: Secondary | ICD-10-CM | POA: Insufficient documentation

## 2020-01-15 DIAGNOSIS — Z79899 Other long term (current) drug therapy: Secondary | ICD-10-CM | POA: Diagnosis not present

## 2020-01-15 DIAGNOSIS — G4730 Sleep apnea, unspecified: Secondary | ICD-10-CM | POA: Insufficient documentation

## 2020-01-15 DIAGNOSIS — Z95828 Presence of other vascular implants and grafts: Secondary | ICD-10-CM

## 2020-01-15 LAB — CBC WITH DIFFERENTIAL/PLATELET
Abs Immature Granulocytes: 0.02 10*3/uL (ref 0.00–0.07)
Basophils Absolute: 0.1 10*3/uL (ref 0.0–0.1)
Basophils Relative: 1 %
Eosinophils Absolute: 0.2 10*3/uL (ref 0.0–0.5)
Eosinophils Relative: 3 %
HCT: 36.9 % (ref 36.0–46.0)
Hemoglobin: 12.6 g/dL (ref 12.0–15.0)
Immature Granulocytes: 0 %
Lymphocytes Relative: 38 %
Lymphs Abs: 2.9 10*3/uL (ref 0.7–4.0)
MCH: 33.2 pg (ref 26.0–34.0)
MCHC: 34.1 g/dL (ref 30.0–36.0)
MCV: 97.1 fL (ref 80.0–100.0)
Monocytes Absolute: 0.7 10*3/uL (ref 0.1–1.0)
Monocytes Relative: 9 %
Neutro Abs: 3.9 10*3/uL (ref 1.7–7.7)
Neutrophils Relative %: 49 %
Platelets: 295 10*3/uL (ref 150–400)
RBC: 3.8 MIL/uL — ABNORMAL LOW (ref 3.87–5.11)
RDW: 12.4 % (ref 11.5–15.5)
WBC: 7.8 10*3/uL (ref 4.0–10.5)
nRBC: 0 % (ref 0.0–0.2)

## 2020-01-15 LAB — IRON AND TIBC
Iron: 149 ug/dL (ref 28–170)
Saturation Ratios: 74 % — ABNORMAL HIGH (ref 10.4–31.8)
TIBC: 200 ug/dL — ABNORMAL LOW (ref 250–450)
UIBC: 51 ug/dL

## 2020-01-15 LAB — FERRITIN: Ferritin: 48 ng/mL (ref 11–307)

## 2020-01-15 MED ORDER — SODIUM CHLORIDE 0.9% FLUSH
10.0000 mL | INTRAVENOUS | Status: DC | PRN
  Administered 2020-01-15: 10 mL via INTRAVENOUS
  Filled 2020-01-15: qty 10

## 2020-01-15 MED ORDER — HEPARIN SOD (PORK) LOCK FLUSH 100 UNIT/ML IV SOLN
500.0000 [IU] | Freq: Once | INTRAVENOUS | Status: AC
  Administered 2020-01-15: 500 [IU] via INTRAVENOUS
  Filled 2020-01-15: qty 5

## 2020-01-21 ENCOUNTER — Other Ambulatory Visit: Payer: Self-pay | Admitting: Oncology

## 2020-01-21 ENCOUNTER — Other Ambulatory Visit: Payer: Self-pay

## 2020-01-21 ENCOUNTER — Inpatient Hospital Stay

## 2020-01-21 DIAGNOSIS — I10 Essential (primary) hypertension: Secondary | ICD-10-CM

## 2020-01-21 MED ORDER — SODIUM CHLORIDE 0.9% FLUSH
10.0000 mL | Freq: Once | INTRAVENOUS | Status: AC
  Administered 2020-01-21: 10 mL via INTRAVENOUS
  Filled 2020-01-21: qty 10

## 2020-01-21 MED ORDER — METOPROLOL TARTRATE 25 MG PO TABS: 25.0000 mg | ORAL_TABLET | Freq: Two times a day (BID) | ORAL | 2 refills | Status: DC

## 2020-01-21 MED ORDER — HEPARIN SOD (PORK) LOCK FLUSH 100 UNIT/ML IV SOLN
500.0000 [IU] | Freq: Once | INTRAVENOUS | Status: AC
  Administered 2020-01-21: 500 [IU] via INTRAVENOUS
  Filled 2020-01-21: qty 5

## 2020-01-21 NOTE — Progress Notes (Signed)
Labs reviewed with MD, per MD to continue with phlebotomy today through her port. VSS. Pt tolerated phlebotomy well with no complications. Pt stable for discharge.   Jadelin Eng [employer]

## 2020-03-02 ENCOUNTER — Encounter: Payer: Self-pay | Admitting: Internal Medicine

## 2020-03-02 ENCOUNTER — Ambulatory Visit (INDEPENDENT_AMBULATORY_CARE_PROVIDER_SITE_OTHER): Payer: PRIVATE HEALTH INSURANCE | Admitting: Internal Medicine

## 2020-03-02 ENCOUNTER — Other Ambulatory Visit: Payer: Self-pay

## 2020-03-02 VITALS — BP 124/82 | HR 74 | Temp 98.7°F

## 2020-03-02 DIAGNOSIS — G40909 Epilepsy, unspecified, not intractable, without status epilepticus: Secondary | ICD-10-CM

## 2020-03-02 DIAGNOSIS — G4733 Obstructive sleep apnea (adult) (pediatric): Secondary | ICD-10-CM

## 2020-03-02 DIAGNOSIS — E039 Hypothyroidism, unspecified: Secondary | ICD-10-CM

## 2020-03-02 DIAGNOSIS — I10 Essential (primary) hypertension: Secondary | ICD-10-CM | POA: Diagnosis not present

## 2020-03-02 DIAGNOSIS — Z9989 Dependence on other enabling machines and devices: Secondary | ICD-10-CM

## 2020-03-02 DIAGNOSIS — Z23 Encounter for immunization: Secondary | ICD-10-CM | POA: Diagnosis not present

## 2020-03-02 NOTE — Patient Instructions (Signed)

## 2020-03-02 NOTE — Progress Notes (Signed)
[facility] [place], [place] [address]  Pulmonary Sleep Medicine   Office Visit Note  Patient Name: Christina Stephens DOB: 06-Aug-1975 MRN [id number]  Date of Service: 03/02/2020  Complaints/HPI: She has been doing well with her OSA. She continues to use her PAP device. Feels better with it. She has no issues with the device. No admissions to hospital. She has been vaccinated for flu and covid. Getting regular downloads also has been updated with supplies  ROS  General: (-) fever, (-) chills, (-) night sweats, (-) weakness Skin: (-) rashes, (-) itching,. Eyes: (-) visual changes, (-) redness, (-) itching. Nose and Sinuses: (-) nasal stuffiness or itchiness, (-) postnasal drip, (-) nosebleeds, (-) sinus trouble. Mouth and Throat: (-) sore throat, (-) hoarseness. Neck: (-) swollen glands, (-) enlarged thyroid, (-) neck pain. Respiratory: - cough, (-) bloody sputum, - shortness of breath, - wheezing. Cardiovascular: - ankle swelling, (-) chest pain. Lymphatic: (-) lymph node enlargement. Neurologic: (-) numbness, (-) tingling. Psychiatric: (-) anxiety, (-) depression   Current Medication: Outpatient Encounter Medications as of 03/02/2020  Medication Sig Note  . Calcium Carbonate-Vitamin D 600-200 MG-UNIT TABS Take by mouth.   Marland Kitchen CALCIUM-VITAMIN D PO Take 1 tablet by mouth daily.   Marland Kitchen levothyroxine (SYNTHROID) 50 MCG tablet Take 1 tablet (50 mcg total) by mouth daily.   . metoprolol tartrate (LOPRESSOR) 25 MG tablet Take 1 tablet (25 mg total) by mouth 2 (two) times daily.   . phenytoin (DILANTIN) 100 MG ER capsule TAKE 2 CAPSULES (200 MG TOTAL) BY MOUTH 2 (TWO) TIMES DAILY. 01/02/2015: Received from: External Pharmacy  . polyethylene glycol (MIRALAX / GLYCOLAX) packet 1 POWDER PACK IN 12 OZ WATER DAILY FOR CONSTIPATION    Facility-Administered Encounter Medications as of 03/02/2020  Medication  . sodium chloride flush (NS) 0.9 % injection 10 mL  . sodium  chloride flush (NS) 0.9 % injection 10 mL    Surgical History: Past Surgical History:  Procedure Laterality Date  . REDUCTION MAMMAPLASTY Bilateral 1993  . VP STUNT HEAD SURGERYOther]  2000    Medical History: Past Medical History:  Diagnosis Date  . CPAP (continuous positive airway pressure) dependence   . Hereditary hemochromatosis ([place])   . Hypertension   . Seizures ([place])   . Sleep apnea   . Spina bifida ([place])   . Thyroid disease    hypothyroidism    Family History: Family History  Problem Relation Age of Onset  . Ovarian cancer Maternal Aunt   . Hypertension Father   . CAD Father   . Arthritis Sister   . CAD Paternal Grandfather   . Breast cancer Maternal Grandmother     Social History: Social History   Socioeconomic History  . Marital status: Single    Spouse name: Not on file  . Number of children: Not on file  . Years of education: Not on file  . Highest education level: Not on file  Occupational History  . Not on file  Tobacco Use  . Smoking status: Never Smoker  . Smokeless tobacco: Never Used  Substance and Sexual Activity  . Alcohol use: No  . Drug use: No  . Sexual activity: Not on file  Other Topics Concern  . Not on file  Social History Narrative  . Not on file   Social Determinants of Health   Financial Resource Strain:   . Difficulty of Paying Living Expenses: Not on file  Food Insecurity:   . Worried About Running  Out of Food in the Last Year: Not on file  . Ran Out of Food in the Last Year: Not on file  Transportation Needs:   . Lack of Transportation (Medical): Not on file  . Lack of Transportation (Non-Medical): Not on file  Physical Activity:   . Days of Exercise per Week: Not on file  . Minutes of Exercise per Session: Not on file  Stress:   . Feeling of Stress : Not on file  Social Connections:   . Frequency of Communication with Friends and Family: Not on file  . Frequency of Social Gatherings with Friends and Family:  Not on file  . Attends Religious Services: Not on file  . Active Member of Clubs or Organizations: Not on file  . Attends Archivist Meetings: Not on file  . Marital Status: Not on file  Intimate Partner Violence:   . Fear of Current or Ex-Partner: Not on file  . Emotionally Abused: Not on file  . Physically Abused: Not on file  . Sexually Abused: Not on file    Vital Signs: Blood pressure 124/82, pulse 74, temperature 98.7 F (37.1 C), SpO2 97 %.  Examination: General Appearance: The patient is well-developed, well-nourished, and in no distress. Skin: Gross inspection of skin unremarkable. Head: normocephalic, no gross deformities. Eyes: no gross deformities noted. ENT: ears appear grossly normal no exudates. Neck: Supple. No thyromegaly. No LAD. Respiratory: no rhonchi noted. Cardiovascular: Normal S1 and S2 without murmur or rub. Extremities: No cyanosis. pulses are equal. Neurologic: Alert and oriented. No involuntary movements.  LABS: Recent Results (from the past 2160 hour(s))  Iron and TIBC     Status: Abnormal   Collection Time: 01/15/20 10:20 AM  Result Value Ref Range   Iron 149 28 - 170 ug/dL   TIBC 200 (L) 250 - 450 ug/dL   Saturation Ratios 74 (H) 10.4 - 31.8 %   UIBC 51 ug/dL    Comment: Performed at [facility], [address] [place], [place] [address]  CBC with Differential/Platelet     Status: Abnormal   Collection Time: 01/15/20 10:20 AM  Result Value Ref Range   WBC 7.8 4.0 - 10.5 K/uL   RBC 3.80 (L) 3.87 - 5.11 MIL/uL   Hemoglobin 12.6 12.0 - 15.0 g/dL   HCT 36.9 36 - 46 %   MCV 97.1 80.0 - 100.0 fL   MCH 33.2 26.0 - 34.0 pg   MCHC 34.1 30.0 - 36.0 g/dL   RDW 12.4 11.5 - 15.5 %   Platelets 295 150 - 400 K/uL   nRBC 0.0 0.0 - 0.2 %   Neutrophils Relative % 49 %   Neutro Abs 3.9 1.7 - 7.7 K/uL   Lymphocytes Relative 38 %   Lymphs Abs 2.9 0.7 - 4.0 K/uL   Monocytes Relative 9 %   Monocytes Absolute 0.7 0.1 - 1.0 K/uL    Eosinophils Relative 3 %   Eosinophils Absolute 0.2 0.0 - 0.5 K/uL   Basophils Relative 1 %   Basophils Absolute 0.1 0.0 - 0.1 K/uL   Immature Granulocytes 0 %   Abs Immature Granulocytes 0.02 0.00 - 0.07 K/uL    Comment: Performed at [facility], [address] [place], [place] [address]  Ferritin     Status: None   Collection Time: 01/15/20 10:20 AM  Result Value Ref Range   Ferritin 48 11 - 307 ng/mL    Comment: Performed at [facility], [address] [place], [place] [address]    Radiology: MM 3D SCREEN BREAST BILATERAL  Result Date: 10/11/2019 CLINICAL DATA:  Screening. EXAM: DIGITAL SCREENING BILATERAL MAMMOGRAM WITH TOMO AND CAD COMPARISON:  Previous exam(s). ACR Breast Density Category b: There are scattered areas of fibroglandular density. FINDINGS: There are no findings suspicious for malignancy. Images were processed with CAD. IMPRESSION: No mammographic evidence of malignancy. A result letter of this screening mammogram will be mailed directly to the patient. RECOMMENDATION: Screening mammogram in one year. (Code:SM-B-01Y) BI-RADS CATEGORY  1: Negative. Electronically Signed   By: Lillia Mountain M.D.   On: 10/11/2019 10:00    No results found.  No results found.    Assessment and Plan: Patient Active Problem List   Diagnosis Date Noted  . Encounter for general adult medical examination with abnormal findings 09/25/2019  . Encounter for screening mammogram for malignant neoplasm of breast 09/25/2019  . Dysuria 10/01/2018  . Acquired hypothyroidism 06/12/2018  . Routine cervical smear 06/12/2018  . Needs flu shot 02/04/2018  . Essential hypertension 05/31/2017  . Sleep apnea 05/31/2017  . Spina bifida ([place]) 05/31/2017  . Hemochromatosis 11/28/2014  . Word finding difficulty 02/28/2013  . Seizure disorder ([place]) 02/04/2013  . Neurogenic bladder 02/04/2013  . Left ankle sprain 09/29/2011  . Left leg pain 09/29/2011    1. OSA controlled will  continue with her current pressures 2. Obesity diet and exercise 3. HTN controlled 4. Hypothyroid on supplemental syntroid 5. Seizure disorder controlled no recent episodes  General Counseling: I have discussed the findings of the evaluation and examination with Christina Stephens.  I have also discussed any further diagnostic evaluation thatmay be needed or ordered today. Christina Stephens verbalizes understanding of the findings of todays visit. We also reviewed her medications today and discussed drug interactions and side effects including but not limited excessive drowsiness and altered mental states. We also discussed that there is always a risk not just to her but also people around her. she has been encouraged to call the office with any questions or concerns that should arise related to todays visit.  Orders Placed This Encounter  Procedures  . Flu Vaccine MDCK QUAD PF     Time spent: 39mn  I have personally obtained a history, examined the patient, evaluated laboratory and imaging results, formulated the assessment and plan and placed orders.    SAllyne Gee MD [facility] Sleep [facility]

## 2020-03-04 ENCOUNTER — Other Ambulatory Visit: Payer: Self-pay

## 2020-03-04 ENCOUNTER — Inpatient Hospital Stay: Attending: Oncology

## 2020-03-04 DIAGNOSIS — Z452 Encounter for adjustment and management of vascular access device: Secondary | ICD-10-CM | POA: Diagnosis not present

## 2020-03-04 DIAGNOSIS — Z95828 Presence of other vascular implants and grafts: Secondary | ICD-10-CM

## 2020-03-04 MED ORDER — HEPARIN SOD (PORK) LOCK FLUSH 100 UNIT/ML IV SOLN
INTRAVENOUS | Status: AC
  Filled 2020-03-04: qty 5

## 2020-03-04 MED ORDER — SODIUM CHLORIDE 0.9% FLUSH
10.0000 mL | INTRAVENOUS | Status: DC | PRN
  Administered 2020-03-04: 10 mL via INTRAVENOUS
  Filled 2020-03-04: qty 10

## 2020-03-04 MED ORDER — HEPARIN SOD (PORK) LOCK FLUSH 100 UNIT/ML IV SOLN
500.0000 [IU] | Freq: Once | INTRAVENOUS | Status: AC
  Administered 2020-03-04: 500 [IU] via INTRAVENOUS
  Filled 2020-03-04: qty 5

## 2020-03-19 ENCOUNTER — Encounter: Payer: Self-pay | Admitting: Nurse Practitioner

## 2020-03-19 ENCOUNTER — Ambulatory Visit (INDEPENDENT_AMBULATORY_CARE_PROVIDER_SITE_OTHER): Payer: PRIVATE HEALTH INSURANCE | Admitting: Nurse Practitioner

## 2020-03-19 ENCOUNTER — Other Ambulatory Visit: Payer: Self-pay

## 2020-03-19 VITALS — BP 132/80 | HR 83 | Temp 98.0°F | Resp 16 | Ht <= 58 in | Wt 123.0 lb

## 2020-03-19 DIAGNOSIS — I10 Essential (primary) hypertension: Secondary | ICD-10-CM

## 2020-03-19 DIAGNOSIS — E039 Hypothyroidism, unspecified: Secondary | ICD-10-CM

## 2020-03-19 NOTE — Progress Notes (Signed)
[facility] [place], [place] [address]  Internal MEDICINE  Office Visit Note  Patient Name: Christina Stephens  [id number]  [id number]  Date of Service: 04/12/2020  Chief Complaint  Patient presents with  . Follow-up  . Hypertension  . Quality Metric Gaps    tetnaus, Hep C  . controlled substance    reviewed with PT    The patient presents for routine follow up visit. She has no concerns or complaints today. States that she is doing well.       Current Medication: Outpatient Encounter Medications as of 03/19/2020  Medication Sig Note  . Calcium Carbonate-Vitamin D 600-200 MG-UNIT TABS Take by mouth.   Marland Kitchen CALCIUM-VITAMIN D PO Take 1 tablet by mouth daily.   Marland Kitchen levothyroxine (SYNTHROID) 50 MCG tablet Take 1 tablet (50 mcg total) by mouth daily.   . metoprolol tartrate (LOPRESSOR) 25 MG tablet Take 1 tablet (25 mg total) by mouth 2 (two) times daily.   . phenytoin (DILANTIN) 100 MG ER capsule TAKE 2 CAPSULES (200 MG TOTAL) BY MOUTH 2 (TWO) TIMES DAILY. 01/02/2015: Received from: External Pharmacy  . polyethylene glycol (MIRALAX / GLYCOLAX) packet 1 POWDER PACK IN 12 OZ WATER DAILY FOR CONSTIPATION    Facility-Administered Encounter Medications as of 03/19/2020  Medication  . sodium chloride flush (NS) 0.9 % injection 10 mL  . sodium chloride flush (NS) 0.9 % injection 10 mL    Surgical History: Past Surgical History:  Procedure Laterality Date  . REDUCTION MAMMAPLASTY Bilateral 1993  . VP STUNT HEAD SURGERYOther]  2000    Medical History: Past Medical History:  Diagnosis Date  . CPAP (continuous positive airway pressure) dependence   . Hereditary hemochromatosis ([place])   . Hypertension   . Seizures ([place])   . Sleep apnea   . Spina bifida ([place])   . Thyroid disease    hypothyroidism    Family History: Family History  Problem Relation Age of Onset  . Ovarian cancer Maternal Aunt   . Hypertension Father   . CAD Father   . Arthritis Sister    . CAD Paternal Grandfather   . Breast cancer Maternal Grandmother     Social History   Socioeconomic History  . Marital status: Single    Spouse name: Not on file  . Number of children: Not on file  . Years of education: Not on file  . Highest education level: Not on file  Occupational History  . Not on file  Tobacco Use  . Smoking status: Never Smoker  . Smokeless tobacco: Never Used  Substance and Sexual Activity  . Alcohol use: No  . Drug use: No  . Sexual activity: Not on file  Other Topics Concern  . Not on file  Social History Narrative  . Not on file   Social Determinants of Health   Financial Resource Strain:   . Difficulty of Paying Living Expenses: Not on file  Food Insecurity:   . Worried About Charity fundraiser in the Last Year: Not on file  . Ran Out of Food in the Last Year: Not on file  Transportation Needs:   . Lack of Transportation (Medical): Not on file  . Lack of Transportation (Non-Medical): Not on file  Physical Activity:   . Days of Exercise per Week: Not on file  . Minutes of Exercise per Session: Not on file  Stress:   . Feeling of Stress : Not on file  Social Connections:   .  Frequency of Communication with Friends and Family: Not on file  . Frequency of Social Gatherings with Friends and Family: Not on file  . Attends Religious Services: Not on file  . Active Member of Clubs or Organizations: Not on file  . Attends Archivist Meetings: Not on file  . Marital Status: Not on file  Intimate Partner Violence:   . Fear of Current or Ex-Partner: Not on file  . Emotionally Abused: Not on file  . Physically Abused: Not on file  . Sexually Abused: Not on file      Review of Systems  Constitutional: Negative for activity change, chills, fatigue and unexpected weight change.  HENT: Negative for congestion, postnasal drip, rhinorrhea, sneezing and sore throat.   Respiratory: Negative for apnea, cough, chest tightness, shortness  of breath and wheezing.   Cardiovascular: Negative for chest pain and palpitations.  Gastrointestinal: Negative for abdominal pain, constipation, diarrhea, nausea and vomiting.  Endocrine: Negative for cold intolerance, heat intolerance, polydipsia and polyuria.       Stable hypothyroid  Musculoskeletal: Positive for arthralgias and gait problem. Negative for back pain, joint swelling and neck pain.  Skin: Negative for rash.  Allergic/Immunologic: Negative for environmental allergies.  Neurological: Positive for seizures. Negative for dizziness, tremors, numbness and headaches.  Hematological: Negative for adenopathy. Does not bruise/bleed easily.  Psychiatric/Behavioral: Negative for behavioral problems (Depression), sleep disturbance and suicidal ideas. The patient is not nervous/anxious.     Today's Vitals   03/19/20 0934  BP: 132/80  Pulse: 83  Resp: 16  Temp: 98 F (36.7 C)  SpO2: 99%  Weight: 123 lb (55.8 kg)  Height: 4' 7"  (1.397 m)   Body mass index is 28.59 kg/m.  Physical Exam Vitals and nursing note reviewed.  Constitutional:      General: She is not in acute distress.    Appearance: Normal appearance. She is well-developed. She is not diaphoretic.  HENT:     Head: Normocephalic and atraumatic.     Nose: Nose normal.     Mouth/Throat:     Pharynx: No oropharyngeal exudate.  Eyes:     Conjunctiva/sclera: Conjunctivae normal.     Pupils: Pupils are equal, round, and reactive to light.  Neck:     Thyroid: No thyromegaly.     Vascular: No JVD.     Trachea: No tracheal deviation.  Cardiovascular:     Rate and Rhythm: Normal rate and regular rhythm.     Pulses: Normal pulses.     Heart sounds: Normal heart sounds. No murmur heard.  No friction rub. No gallop.   Pulmonary:     Effort: Pulmonary effort is normal. No respiratory distress.     Breath sounds: Normal breath sounds. No wheezing or rales.  Chest:     Chest wall: No tenderness.  Abdominal:      General: Bowel sounds are normal.     Palpations: Abdomen is soft.     Tenderness: There is no abdominal tenderness.  Musculoskeletal:        General: Normal range of motion.     Cervical back: Normal range of motion and neck supple.  Lymphadenopathy:     Cervical: No cervical adenopathy.  Skin:    General: Skin is warm and dry.  Neurological:     Mental Status: She is alert and oriented to person, place, and time. Mental status is at baseline.     Cranial Nerves: No cranial nerve deficit.     Comments: Patient  is at her neurological baseline.   Psychiatric:        Mood and Affect: Mood normal.        Behavior: Behavior normal.        Thought Content: Thought content normal.        Judgment: Judgment normal.    Assessment/Plan: 1. Essential hypertension Stable. Continue bp medication as prescribed   2. Acquired hypothyroidism Thyroid stable. Continue levothyroxine as prescribed   3. Hereditary hemochromatosis ([place]) Continue routine visits with hematology as scheduled.   General Counseling: Gwynevere verbalizes understanding of the findings of todays visit and agrees with plan of treatment. I have discussed any further diagnostic evaluation that may be needed or ordered today. We also reviewed her medications today. she has been encouraged to call the office with any questions or concerns that should arise related to todays visit.  This patient was seen by Leretha Pol FNP Collaboration with Dr [name] as a part of collaborative care agreement  Total time spent: 25 Minutes   Time spent includes review of chart, medications, test results, and follow up plan with the patient.      Dr [name] Internal medicine

## 2020-04-14 ENCOUNTER — Other Ambulatory Visit: Payer: Self-pay

## 2020-04-14 DIAGNOSIS — I10 Essential (primary) hypertension: Secondary | ICD-10-CM

## 2020-04-14 MED ORDER — METOPROLOL TARTRATE 25 MG PO TABS: 25.0000 mg | ORAL_TABLET | Freq: Two times a day (BID) | ORAL | 3 refills | Status: DC

## 2020-04-22 ENCOUNTER — Inpatient Hospital Stay

## 2020-04-22 ENCOUNTER — Inpatient Hospital Stay: Attending: Oncology

## 2020-04-22 DIAGNOSIS — D649 Anemia, unspecified: Secondary | ICD-10-CM | POA: Diagnosis not present

## 2020-04-22 DIAGNOSIS — I10 Essential (primary) hypertension: Secondary | ICD-10-CM | POA: Diagnosis not present

## 2020-04-22 DIAGNOSIS — Z79899 Other long term (current) drug therapy: Secondary | ICD-10-CM | POA: Diagnosis not present

## 2020-04-22 DIAGNOSIS — E038 Other specified hypothyroidism: Secondary | ICD-10-CM | POA: Insufficient documentation

## 2020-04-22 LAB — CBC WITH DIFFERENTIAL/PLATELET
Abs Immature Granulocytes: 0.02 10*3/uL (ref 0.00–0.07)
Basophils Absolute: 0 10*3/uL (ref 0.0–0.1)
Basophils Relative: 1 %
Eosinophils Absolute: 0.2 10*3/uL (ref 0.0–0.5)
Eosinophils Relative: 2 %
HCT: 35.6 % — ABNORMAL LOW (ref 36.0–46.0)
Hemoglobin: 12.4 g/dL (ref 12.0–15.0)
Immature Granulocytes: 0 %
Lymphocytes Relative: 33 %
Lymphs Abs: 2.4 10*3/uL (ref 0.7–4.0)
MCH: 33.5 pg (ref 26.0–34.0)
MCHC: 34.8 g/dL (ref 30.0–36.0)
MCV: 96.2 fL (ref 80.0–100.0)
Monocytes Absolute: 0.7 10*3/uL (ref 0.1–1.0)
Monocytes Relative: 10 %
Neutro Abs: 3.8 10*3/uL (ref 1.7–7.7)
Neutrophils Relative %: 54 %
Platelets: 233 10*3/uL (ref 150–400)
RBC: 3.7 MIL/uL — ABNORMAL LOW (ref 3.87–5.11)
RDW: 12 % (ref 11.5–15.5)
WBC: 7.1 10*3/uL (ref 4.0–10.5)
nRBC: 0 % (ref 0.0–0.2)

## 2020-04-22 LAB — FERRITIN: Ferritin: 46 ng/mL (ref 11–307)

## 2020-04-22 LAB — IRON AND TIBC
Iron: 181 ug/dL — ABNORMAL HIGH (ref 28–170)
Saturation Ratios: 89 % — ABNORMAL HIGH (ref 10.4–31.8)
TIBC: 204 ug/dL — ABNORMAL LOW (ref 250–450)
UIBC: 23 ug/dL

## 2020-04-22 MED ORDER — SODIUM CHLORIDE 0.9% FLUSH
10.0000 mL | INTRAVENOUS | Status: AC | PRN
  Administered 2020-04-22: 10 mL via INTRAVENOUS
  Filled 2020-04-22: qty 10

## 2020-04-22 MED ORDER — HEPARIN SOD (PORK) LOCK FLUSH 100 UNIT/ML IV SOLN
500.0000 [IU] | Freq: Once | INTRAVENOUS | Status: AC
  Administered 2020-04-22: 500 [IU] via INTRAVENOUS
  Filled 2020-04-22: qty 5

## 2020-04-22 NOTE — Progress Notes (Signed)
318m phlebotomy ordered today; RN was able to successfully obtain 280 ml from [place] needle deaccessed and flushed with NS and heparin. Pt tolerated procedure well. Felt fine at discharge. Ambulatory with crutches.

## 2020-06-10 ENCOUNTER — Inpatient Hospital Stay: Attending: Oncology

## 2020-06-10 ENCOUNTER — Other Ambulatory Visit: Payer: Self-pay

## 2020-06-10 ENCOUNTER — Inpatient Hospital Stay (HOSPITAL_BASED_OUTPATIENT_CLINIC_OR_DEPARTMENT_OTHER): Admitting: Oncology

## 2020-06-10 ENCOUNTER — Inpatient Hospital Stay

## 2020-06-10 DIAGNOSIS — E039 Hypothyroidism, unspecified: Secondary | ICD-10-CM | POA: Insufficient documentation

## 2020-06-10 DIAGNOSIS — G40909 Epilepsy, unspecified, not intractable, without status epilepticus: Secondary | ICD-10-CM | POA: Insufficient documentation

## 2020-06-10 DIAGNOSIS — Z452 Encounter for adjustment and management of vascular access device: Secondary | ICD-10-CM | POA: Insufficient documentation

## 2020-06-10 DIAGNOSIS — G4730 Sleep apnea, unspecified: Secondary | ICD-10-CM | POA: Insufficient documentation

## 2020-06-10 DIAGNOSIS — Q059 Spina bifida, unspecified: Secondary | ICD-10-CM | POA: Diagnosis not present

## 2020-06-10 DIAGNOSIS — Z95828 Presence of other vascular implants and grafts: Secondary | ICD-10-CM

## 2020-06-10 DIAGNOSIS — I10 Essential (primary) hypertension: Secondary | ICD-10-CM | POA: Insufficient documentation

## 2020-06-10 DIAGNOSIS — W19XXXA Unspecified fall, initial encounter: Secondary | ICD-10-CM

## 2020-06-10 MED ORDER — HEPARIN SOD (PORK) LOCK FLUSH 100 UNIT/ML IV SOLN
500.0000 [IU] | Freq: Once | INTRAVENOUS | Status: AC
  Administered 2020-06-10: 500 [IU] via INTRAVENOUS
  Filled 2020-06-10: qty 5

## 2020-06-10 MED ORDER — HEPARIN SOD (PORK) LOCK FLUSH 100 UNIT/ML IV SOLN
INTRAVENOUS | Status: AC
  Filled 2020-06-10: qty 5

## 2020-06-10 MED ORDER — SODIUM CHLORIDE 0.9% FLUSH
10.0000 mL | INTRAVENOUS | Status: DC | PRN
  Administered 2020-06-10: 10 mL via INTRAVENOUS
  Filled 2020-06-10: qty 10

## 2020-06-10 NOTE — Progress Notes (Signed)
Symptom Management Consult note [facility]  [phone] [phone]  Patient Care Team: [name], MD as PCP - General (Internal Medicine) Lloyd Huger, MD as Consulting Physician (Oncology)   Name of the patient: Christina Stephens  [id number]  12-Sep-1975   Date of visit: 06/10/2020  Diagnosis: Hereditary hemochromatosis  Chief Complaint: Fall prior to port flush  Current Treatment: Intermittent phlebotomy  Oncology History:  Mrs. Kirkey is a 45 year old female with past medical history significant for hypertension, spina bifida, hypothyroidism, sleep apnea, seizure disorder, neurogenic bladder who is followed by Dr. Grayland Ormond for hereditary hemochromatosis.  She unfortunately did not tolerate phlebotomies and was started on Exjade.  Iron saturation improved.  She stopped taking Exjade in September 2020.  Appears to be tolerating phlebotomies again.  She last received a phlebotomy on 04/22/2020 where 280 mL was removed from her port.  Subjective Data: Today she presents for port flush.  While walking back to infusion, tripped and fell to her knees bracing herself with her hands.  She denies injury.  She denies any pain.  She uses crutches for spina bifida.  Per cancer center protocol, APP asked to assess for injury.  ECOG: 0 - Asymptomatic   Review of Systems  Constitutional: Negative.  Negative for chills, fever, malaise/fatigue and weight loss.  HENT: Negative for congestion, ear pain and tinnitus.   Eyes: Negative.  Negative for blurred vision and double vision.  Respiratory: Negative.  Negative for cough, sputum production and shortness of breath.   Cardiovascular: Negative.  Negative for chest pain, palpitations and leg swelling.  Gastrointestinal: Negative.  Negative for abdominal pain, constipation, diarrhea, nausea and vomiting.  Genitourinary: Negative for dysuria, frequency and urgency.  Musculoskeletal: Positive for  falls. Negative for back pain.  Skin: Negative.  Negative for rash.  Neurological: Negative.  Negative for weakness and headaches.  Endo/Heme/Allergies: Negative.  Does not bruise/bleed easily.  Psychiatric/Behavioral: Negative.  Negative for depression. The patient is not nervous/anxious and does not have insomnia.    Physical Exam Constitutional:      General: Vital signs are normal.     Appearance: Normal appearance.  HENT:     Head: Normocephalic and atraumatic.  Eyes:     Pupils: Pupils are equal, round, and reactive to light.  Cardiovascular:     Rate and Rhythm: Normal rate and regular rhythm.     Heart sounds: Normal heart sounds. No murmur heard.   Pulmonary:     Effort: Pulmonary effort is normal.     Breath sounds: Normal breath sounds. No wheezing.  Abdominal:     General: Bowel sounds are normal. There is no distension.     Palpations: Abdomen is soft.     Tenderness: There is no abdominal tenderness.  Musculoskeletal:        General: No edema. Normal range of motion.     Cervical back: Normal range of motion.  Skin:    General: Skin is warm and dry.     Findings: No rash.  Neurological:     Mental Status: She is alert and oriented to person, place, and time.  Psychiatric:        Judgment: Judgment normal.    Assessment and plan:  Fall: -Secondary to impaired mobility d/t PMH. -Vital signs are stable -Patient is alert and oriented -Uses crutches for spina bifida -Denies any injury or pain at this time. -No evidence of injury to knees or hands. -No imaging needed at this  time. -Patient to call clinic if she develops pain to knees or hands. -Patient is ready to leave clinic and go home.  Disposition: -RTC as scheduled for follow-up with Dr. Grayland Ormond.  Greater than 50% was spent in counseling and coordination of care with this patient including but not limited to discussion of the relevant topics above (See A&P) including, but not limited to diagnosis  and management of acute and chronic medical conditions.   Faythe Casa, NP 06/10/2020 11:58 AM

## 2020-06-10 NOTE — Progress Notes (Signed)
1120- Patient came to cancer center for port flush only today. Called patient back from the waiting area to the infusion area to perform port flush and while patient was ambulating with crutches from the waiting area, patient appeared to stumble and proceeded to experience a witnessed fall outside of the infusion room door entrance. Patient fell forward to her knees and hands. Patient did not hit her head. Patient immediately stated, "I feel fine. I am not having any pain. I am not hurt. I am okay. Please, you don't need to tell anyone I fell." Patient states, "I fell forward to my knees and caught myself with my hands. I might have been moving too fast." Staff explained to patient that although she feels fine, denies any pain, and does not feel hurt, the fall must be reported per policy and she must be evaluated prior to leaving clinic today. Patient verbalized understanding and agreed. Yellow fall risk arm band was in place. Staff assisted patient with getting up from the floor and into a wheel chair. Patient transported back to the infusion area via wheel chair and staff then assisted patient with transferring over to chair. MD, Dr. Grayland Ormond, and NP, Faythe Casa, notified. NP, Faythe Casa, coming to infusion to evaluate patient.   1125- Vital signs obtained and stable, see flow sheet. Port flush performed.  1132- Patient requested we call her mother and let her know she will be down shortly to leave. Patient's mother, Ziyanna Tolin, contacted via telephone and updated on patient's fall and time frame for patient's discharge.   1137- Patient remains stable, denies any pain, and reports she is not hurt. Patient states, "I am ready to go. When are they coming to see me? I am not going to wait here forever." NP, Faythe Casa, called via telephone and reports she is coming over now to evaluate patient.   1140- NP, Faythe Casa, at chair side to evaluate patient. Per NP order: patient may be  discharged to home at this time. Patient educated to call clinic if any concerns, issues, or pain develop. Patient also educated to go to the emergency department if any emergent symptoms develop. Patient verbalized understanding.  1145- Patient stable and states, "I am ready to go." Staff offered to transport patient out of the cancer center in a wheelchair, however, patient refused to be taken out of the cancer center in a wheel chair. Patient states, "I will go out with my crutches and have you walk with me."  Patient ambulated with crutches out of the cancer center with staff escort. Assisted patient with getting into her mother's vehicle. Patient stable and discharged to home at this time.

## 2020-06-11 ENCOUNTER — Telehealth: Payer: Self-pay | Admitting: *Deleted

## 2020-06-11 NOTE — Telephone Encounter (Signed)
1117- Follow up phone call completed today post-fall in cancer center clinic on 06/10/2020. Patient reports she feels fine and does not have any issues at this time. Patient encouraged to call clinic if any problems arise. Patient verbalized understanding.

## 2020-06-16 ENCOUNTER — Telehealth: Payer: Self-pay

## 2020-06-16 NOTE — Telephone Encounter (Signed)
Unable to [facility] to confirm cpap download tomorrow at 9:15am

## 2020-06-17 ENCOUNTER — Ambulatory Visit (INDEPENDENT_AMBULATORY_CARE_PROVIDER_SITE_OTHER): Payer: PRIVATE HEALTH INSURANCE

## 2020-06-17 ENCOUNTER — Other Ambulatory Visit: Payer: Self-pay

## 2020-06-17 DIAGNOSIS — G4733 Obstructive sleep apnea (adult) (pediatric): Secondary | ICD-10-CM

## 2020-06-17 NOTE — Progress Notes (Signed)
95 percentile pressure 12.4   95th percentile leak 0.16    apnea-hypopnea index  20.0 /hr   total days used  >4 hr 90 days  total days used <4 hr 0 days  Total compliance 100 percent  Her mother brought card in, Patient uses cpap every night. I will speak to [facility] about AHI. No problems or questions at this time.

## 2020-06-19 ENCOUNTER — Other Ambulatory Visit: Payer: Self-pay | Admitting: Hospice and Palliative Medicine

## 2020-07-09 ENCOUNTER — Other Ambulatory Visit: Payer: Self-pay | Admitting: Internal Medicine

## 2020-07-09 DIAGNOSIS — I10 Essential (primary) hypertension: Secondary | ICD-10-CM

## 2020-07-18 NOTE — Progress Notes (Unsigned)
[facility]  [phone] [phone]  ID: Christina Stephens OB: [birth year]  MR#: [id number]  YSA#:[id number]  Patient Care Team: [name], MD as PCP - General (Internal Medicine) Lloyd Huger, MD as Consulting Physician (Oncology)  CHIEF COMPLAINT: Hereditary hemochromatosis.  INTERVAL HISTORY: Patient returns to clinic today for repeat laboratory work, further evaluation, and continuation of phlebotomy.  She continues to feel well remains asymptomatic.  She has no neurologic complaints.  She denies any recent fevers or illnesses.  She has a good appetite and denies weight loss.  She denies any chest pain, shortness of breath, cough, or hemoptysis.  She denies any nausea, vomiting, constipation, or diarrhea.  She has no urinary complaints.  Patient offers no specific complaints today.  REVIEW OF SYSTEMS:   Review of Systems  Constitutional: Negative.  Negative for fever, malaise/fatigue and weight loss.  Respiratory: Negative.  Negative for cough, hemoptysis and shortness of breath.   Cardiovascular: Negative.  Negative for chest pain and leg swelling.  Gastrointestinal: Negative.  Negative for abdominal pain.  Genitourinary: Negative.  Negative for dysuria.  Musculoskeletal: Negative.  Negative for back pain.  Skin: Negative.  Negative for rash.  Neurological: Negative.  Negative for dizziness, focal weakness and weakness.  Psychiatric/Behavioral: Negative.  The patient is not nervous/anxious.     As per HPI. Otherwise, a complete review of systems is negative.  PAST MEDICAL HISTORY: Past Medical History:  Diagnosis Date  . CPAP (continuous positive airway pressure) dependence   . Hereditary hemochromatosis ([place])   . Hypertension   . Seizures ([place])   . Sleep apnea   . Spina bifida ([place])   . Thyroid disease    hypothyroidism    PAST SURGICAL HISTORY: Past Surgical History:  Procedure Laterality Date  . REDUCTION MAMMAPLASTY  Bilateral 1993  . VP STUNT HEAD SURGERYOther]  2000    FAMILY HISTORY: Family History  Problem Relation Age of Onset  . Ovarian cancer Maternal Aunt   . Hypertension Father   . CAD Father   . Arthritis Sister   . CAD Paternal Grandfather   . Breast cancer Maternal Grandmother     ADVANCED DIRECTIVES (Y/N):  N  HEALTH MAINTENANCE: Social History   Tobacco Use  . Smoking status: Never Smoker  . Smokeless tobacco: Never Used  Substance Use Topics  . Alcohol use: No  . Drug use: No     Colonoscopy:  PAP:  Bone density:  Lipid panel:  Allergies  Allergen Reactions  . Latex     Pt has Spina bifada and was told that if she has surgery she should avoid latex for poss. Reaction/infection.    Current Outpatient Medications  Medication Sig Dispense Refill  . Calcium Carbonate-Vitamin D 600-200 MG-UNIT TABS Take by mouth.    Marland Kitchen CALCIUM-VITAMIN D PO Take 1 tablet by mouth daily.    Marland Kitchen levothyroxine (SYNTHROID) 50 MCG tablet Take 1 tablet (50 mcg total) by mouth daily. 90 tablet 3  . metoprolol tartrate (LOPRESSOR) 25 MG tablet TAKE 1 TABLET BY MOUTH TWICE A DAY 180 tablet 1  . phenytoin (DILANTIN) 100 MG ER capsule TAKE 2 CAPSULES (200 MG TOTAL) BY MOUTH 2 (TWO) TIMES DAILY.  11  . polyethylene glycol (MIRALAX / GLYCOLAX) packet 1 POWDER PACK IN 12 OZ WATER DAILY FOR CONSTIPATION     No current facility-administered medications for this visit.   Facility-Administered Medications Ordered in Other Visits  Medication Dose Route Frequency Provider Last Rate Last  Admin  . sodium chloride flush (NS) 0.9 % injection 10 mL  10 mL Intravenous PRN Cammie Sickle, MD   10 mL at 02/26/16 0917  . sodium chloride flush (NS) 0.9 % injection 10 mL  10 mL Intravenous PRN Nolon Stalls C, MD   10 mL at 01/01/18 1108  . sodium chloride flush (NS) 0.9 % injection 10 mL  10 mL Intravenous PRN Lloyd Huger, MD   10 mL at 04/22/20 1100    OBJECTIVE: Vitals:   07/22/20 1047   BP: 133/78  Pulse: 97  Temp: (!) 96.7 F (35.9 C)  SpO2: 100%     There is no height or weight on file to calculate BMI.    ECOG FS:0 - Asymptomatic  General: Well-developed, well-nourished, no acute distress. Eyes: Pink conjunctiva, anicteric sclera. HEENT: Normocephalic, moist mucous membranes. Lungs: No audible wheezing or coughing. Heart: Regular rate and rhythm. Abdomen: Soft, nontender, no obvious distention. Musculoskeletal: No edema, cyanosis, or clubbing. Neuro: Alert, answering all questions appropriately. Cranial nerves grossly intact. Skin: No rashes or petechiae noted. Psych: Normal affect.   LAB RESULTS:  Lab Results  Component Value Date   NA 137 10/21/2019   K 4.2 10/21/2019   CL 100 10/21/2019   CO2 23 10/21/2019   GLUCOSE 150 (H) 10/21/2019   BUN 17 10/21/2019   CREATININE 0.64 10/21/2019   CALCIUM 9.4 10/21/2019   PROT 7.0 10/21/2019   ALBUMIN 4.2 10/21/2019   AST 15 10/21/2019   ALT 17 10/21/2019   ALKPHOS 78 10/21/2019   BILITOT <0.2 10/21/2019   GFRNONAA 110 10/21/2019   GFRAA 126 10/21/2019    Lab Results  Component Value Date   WBC 5.8 07/21/2020   NEUTROABS 3.2 07/21/2020   HGB 12.4 07/21/2020   HCT 36.9 07/21/2020   MCV 99.7 07/21/2020   PLT 279 07/21/2020   Lab Results  Component Value Date   IRON 174 (H) 07/21/2020   TIBC 210 (L) 07/21/2020   IRONPCTSAT 83 (H) 07/21/2020   Lab Results  Component Value Date   FERRITIN 30 07/21/2020     STUDIES: No results found.  ASSESSMENT: Hereditary hemochromatosis.   PLAN:    1. Hereditary hemochromatosis: Originally diagnosed in July 2011 with homozygous C282Y mutation.  Previously, patient had a difficult time with phlebotomy and was initiated on Exjade for iron chelation.  She previously was taking 250 mg daily from December 2014 through September 2020.  AFP is normal and unchanged at 2.4.  Her most recent abdominal ultrasound on June 20, 2018 did not reveal any suspicious  abnormalities in her liver.  Despite discontinuing Exjade, patient's iron saturation, total iron, and ferritin remain essentially unchanged.  Proceed with phlebotomy today.  Return to clinic in 6 months with repeat laboratory, further evaluation, and continuation of treatment.   2.  Port maintenance: Continue port flushes every 6 to 8 weeks. 3.  Spina bifida: Chronic and unchanged. 4.  Anemia: Resolved.   Patient expressed understanding and was in agreement with this plan. She also understands that She can call clinic at any time with any questions, concerns, or complaints.    Lloyd Huger, MD   07/22/2020 4:17 PM

## 2020-07-20 ENCOUNTER — Other Ambulatory Visit: Payer: Self-pay | Admitting: *Deleted

## 2020-07-21 ENCOUNTER — Inpatient Hospital Stay: Attending: Oncology

## 2020-07-21 DIAGNOSIS — I10 Essential (primary) hypertension: Secondary | ICD-10-CM | POA: Insufficient documentation

## 2020-07-21 DIAGNOSIS — Z8249 Family history of ischemic heart disease and other diseases of the circulatory system: Secondary | ICD-10-CM | POA: Diagnosis not present

## 2020-07-21 DIAGNOSIS — D649 Anemia, unspecified: Secondary | ICD-10-CM | POA: Diagnosis not present

## 2020-07-21 DIAGNOSIS — Q059 Spina bifida, unspecified: Secondary | ICD-10-CM | POA: Insufficient documentation

## 2020-07-21 DIAGNOSIS — Z8261 Family history of arthritis: Secondary | ICD-10-CM | POA: Diagnosis not present

## 2020-07-21 DIAGNOSIS — E039 Hypothyroidism, unspecified: Secondary | ICD-10-CM | POA: Diagnosis not present

## 2020-07-21 DIAGNOSIS — Z803 Family history of malignant neoplasm of breast: Secondary | ICD-10-CM | POA: Diagnosis not present

## 2020-07-21 DIAGNOSIS — Z79899 Other long term (current) drug therapy: Secondary | ICD-10-CM | POA: Insufficient documentation

## 2020-07-21 DIAGNOSIS — Z8041 Family history of malignant neoplasm of ovary: Secondary | ICD-10-CM | POA: Insufficient documentation

## 2020-07-21 LAB — IRON AND TIBC
Iron: 174 ug/dL — ABNORMAL HIGH (ref 28–170)
Saturation Ratios: 83 % — ABNORMAL HIGH (ref 10.4–31.8)
TIBC: 210 ug/dL — ABNORMAL LOW (ref 250–450)
UIBC: 36 ug/dL

## 2020-07-21 LAB — CBC WITH DIFFERENTIAL/PLATELET
Abs Immature Granulocytes: 0.01 10*3/uL (ref 0.00–0.07)
Basophils Absolute: 0 10*3/uL (ref 0.0–0.1)
Basophils Relative: 1 %
Eosinophils Absolute: 0.1 10*3/uL (ref 0.0–0.5)
Eosinophils Relative: 2 %
HCT: 36.9 % (ref 36.0–46.0)
Hemoglobin: 12.4 g/dL (ref 12.0–15.0)
Immature Granulocytes: 0 %
Lymphocytes Relative: 33 %
Lymphs Abs: 1.9 10*3/uL (ref 0.7–4.0)
MCH: 33.5 pg (ref 26.0–34.0)
MCHC: 33.6 g/dL (ref 30.0–36.0)
MCV: 99.7 fL (ref 80.0–100.0)
Monocytes Absolute: 0.5 10*3/uL (ref 0.1–1.0)
Monocytes Relative: 9 %
Neutro Abs: 3.2 10*3/uL (ref 1.7–7.7)
Neutrophils Relative %: 55 %
Platelets: 279 10*3/uL (ref 150–400)
RBC: 3.7 MIL/uL — ABNORMAL LOW (ref 3.87–5.11)
RDW: 12.5 % (ref 11.5–15.5)
WBC: 5.8 10*3/uL (ref 4.0–10.5)
nRBC: 0 % (ref 0.0–0.2)

## 2020-07-21 LAB — FERRITIN: Ferritin: 30 ng/mL (ref 11–307)

## 2020-07-22 ENCOUNTER — Encounter: Payer: Self-pay | Admitting: Oncology

## 2020-07-22 ENCOUNTER — Inpatient Hospital Stay (HOSPITAL_BASED_OUTPATIENT_CLINIC_OR_DEPARTMENT_OTHER): Admitting: Oncology

## 2020-07-22 ENCOUNTER — Inpatient Hospital Stay

## 2020-07-22 MED ORDER — HEPARIN SOD (PORK) LOCK FLUSH 100 UNIT/ML IV SOLN
500.0000 [IU] | Freq: Once | INTRAVENOUS | Status: AC
  Administered 2020-07-22: 500 [IU] via INTRAVENOUS
  Filled 2020-07-22: qty 5

## 2020-07-22 MED ORDER — SODIUM CHLORIDE 0.9% FLUSH
10.0000 mL | Freq: Once | INTRAVENOUS | Status: AC
  Administered 2020-07-22: 10 mL via INTRAVENOUS
  Filled 2020-07-22: qty 10

## 2020-09-02 ENCOUNTER — Inpatient Hospital Stay: Attending: Oncology

## 2020-09-02 ENCOUNTER — Ambulatory Visit: Payer: PRIVATE HEALTH INSURANCE | Admitting: Hospice and Palliative Medicine

## 2020-09-02 ENCOUNTER — Inpatient Hospital Stay

## 2020-09-02 DIAGNOSIS — Z79899 Other long term (current) drug therapy: Secondary | ICD-10-CM | POA: Insufficient documentation

## 2020-09-02 DIAGNOSIS — Z95828 Presence of other vascular implants and grafts: Secondary | ICD-10-CM

## 2020-09-02 MED ORDER — HEPARIN SOD (PORK) LOCK FLUSH 100 UNIT/ML IV SOLN
500.0000 [IU] | Freq: Once | INTRAVENOUS | Status: AC
  Administered 2020-09-02: 500 [IU] via INTRAVENOUS
  Filled 2020-09-02: qty 5

## 2020-09-02 MED ORDER — SODIUM CHLORIDE 0.9% FLUSH
10.0000 mL | Freq: Once | INTRAVENOUS | Status: AC
  Administered 2020-09-02: 10 mL via INTRAVENOUS
  Filled 2020-09-02: qty 10

## 2020-09-02 NOTE — Progress Notes (Signed)
Patient tolerated port flush well today, no concerns voiced. Patient discharged. Stable.

## 2020-09-07 ENCOUNTER — Other Ambulatory Visit: Payer: Self-pay | Admitting: Nurse Practitioner

## 2020-09-07 DIAGNOSIS — E039 Hypothyroidism, unspecified: Secondary | ICD-10-CM

## 2020-09-11 ENCOUNTER — Other Ambulatory Visit: Payer: Self-pay

## 2020-09-11 ENCOUNTER — Encounter: Payer: Self-pay | Admitting: Hospice and Palliative Medicine

## 2020-09-11 ENCOUNTER — Ambulatory Visit (INDEPENDENT_AMBULATORY_CARE_PROVIDER_SITE_OTHER): Payer: PRIVATE HEALTH INSURANCE | Admitting: Hospice and Palliative Medicine

## 2020-09-11 VITALS — BP 132/63 | HR 78 | Temp 97.8°F | Resp 16 | Ht <= 58 in | Wt 118.0 lb

## 2020-09-11 DIAGNOSIS — Z7189 Other specified counseling: Secondary | ICD-10-CM

## 2020-09-11 DIAGNOSIS — I10 Essential (primary) hypertension: Secondary | ICD-10-CM | POA: Diagnosis not present

## 2020-09-11 DIAGNOSIS — E039 Hypothyroidism, unspecified: Secondary | ICD-10-CM

## 2020-09-11 DIAGNOSIS — G4733 Obstructive sleep apnea (adult) (pediatric): Secondary | ICD-10-CM | POA: Diagnosis not present

## 2020-09-11 DIAGNOSIS — Z9989 Dependence on other enabling machines and devices: Secondary | ICD-10-CM

## 2020-09-11 MED ORDER — LEVOTHYROXINE SODIUM 50 MCG PO TABS: 50.0000 ug | ORAL_TABLET | Freq: Every day | ORAL | 3 refills | Status: DC

## 2020-09-11 NOTE — Progress Notes (Signed)
[facility] [place], [place] [address]  Pulmonary Sleep Medicine   Office Visit Note  Patient Name: Christina Stephens DOB: 1975/07/16 MRN [id number]  Date of Service: 09/11/2020  Complaints/HPI:  Patient is here for routine pulmonary follow-up Followed for OSA for which she wears CPAP Last download shows 100% compliance but AHI was elevated at 20.0 At that time pressures were adjusted--has not yet had a repeat download to further evaluate AHI--will need to be set up Continues to wear CPAP each night, sleeping well Denies congestion, dryness or headaches from CPAP Has been cleaning machine with SoClean machine--advised to start cleaning machine by hand and to continue with changing tubing and filters as directed   ROS  General: (-) fever, (-) chills, (-) night sweats, (-) weakness Skin: (-) rashes, (-) itching,. Eyes: (-) visual changes, (-) redness, (-) itching. Nose and Sinuses: (-) nasal stuffiness or itchiness, (-) postnasal drip, (-) nosebleeds, (-) sinus trouble. Mouth and Throat: (-) sore throat, (-) hoarseness. Neck: (-) swollen glands, (-) enlarged thyroid, (-) neck pain. Respiratory: - cough, (-) bloody sputum, - shortness of breath, - wheezing. Cardiovascular: - ankle swelling, (-) chest pain. Lymphatic: (-) lymph node enlargement. Neurologic: (-) numbness, (-) tingling. Psychiatric: (-) anxiety, (-) depression   Current Medication: Outpatient Encounter Medications as of 09/11/2020  Medication Sig Note  . Calcium Carbonate-Vitamin D 600-200 MG-UNIT TABS Take by mouth.   Marland Kitchen CALCIUM-VITAMIN D PO Take 1 tablet by mouth daily.   Marland Kitchen levothyroxine (SYNTHROID) 50 MCG tablet Take 1 tablet (50 mcg total) by mouth daily.   . metoprolol tartrate (LOPRESSOR) 25 MG tablet TAKE 1 TABLET BY MOUTH TWICE A DAY   . phenytoin (DILANTIN) 100 MG ER capsule TAKE 2 CAPSULES (200 MG TOTAL) BY MOUTH 2 (TWO) TIMES DAILY. 01/02/2015: Received from: External Pharmacy  .  polyethylene glycol (MIRALAX / GLYCOLAX) packet 1 POWDER PACK IN 12 OZ WATER DAILY FOR CONSTIPATION   . [DISCONTINUED] levothyroxine (SYNTHROID) 50 MCG tablet Take 1 tablet (50 mcg total) by mouth daily.    Facility-Administered Encounter Medications as of 09/11/2020  Medication  . sodium chloride flush (NS) 0.9 % injection 10 mL  . sodium chloride flush (NS) 0.9 % injection 10 mL  . sodium chloride flush (NS) 0.9 % injection 10 mL    Surgical History: Past Surgical History:  Procedure Laterality Date  . REDUCTION MAMMAPLASTY Bilateral 1993  . VP STUNT HEAD SURGERYOther]  2000    Medical History: Past Medical History:  Diagnosis Date  . CPAP (continuous positive airway pressure) dependence   . Hereditary hemochromatosis ([place])   . Hypertension   . Seizures ([place])   . Sleep apnea   . Spina bifida ([place])   . Thyroid disease    hypothyroidism    Family History: Family History  Problem Relation Age of Onset  . Ovarian cancer Maternal Aunt   . Hypertension Father   . CAD Father   . Arthritis Sister   . CAD Paternal Grandfather   . Breast cancer Maternal Grandmother     Social History: Social History   Socioeconomic History  . Marital status: Single    Spouse name: Not on file  . Number of children: Not on file  . Years of education: Not on file  . Highest education level: Not on file  Occupational History  . Not on file  Tobacco Use  . Smoking status: Never Smoker  . Smokeless tobacco: Never Used  Substance and Sexual Activity  .  Alcohol use: No  . Drug use: No  . Sexual activity: Not on file  Other Topics Concern  . Not on file  Social History Narrative  . Not on file   Social Determinants of Health   Financial Resource Strain: Not on file  Food Insecurity: Not on file  Transportation Needs: Not on file  Physical Activity: Not on file  Stress: Not on file  Social Connections: Not on file  Intimate Partner Violence: Not on file    Vital Signs: Blood  pressure 132/63, pulse 78, temperature 97.8 F (36.6 C), resp. rate 16, height 4' 7"  (1.397 m), weight 118 lb (53.5 kg), SpO2 98 %.  Examination: General Appearance: The patient is well-developed, well-nourished, and in no distress. Skin: Gross inspection of skin unremarkable. Head: normocephalic, no gross deformities. Eyes: no gross deformities noted. ENT: ears appear grossly normal no exudates. Neck: Supple. No thyromegaly. No LAD. Respiratory: Clear throughout, no rhonchi, wheeze or rales noted. Cardiovascular: Normal S1 and S2 without murmur or rub. Extremities: No cyanosis. pulses are equal. Neurologic: Alert and oriented. No involuntary movements.  LABS: Recent Results (from the past 2160 hour(s))  Iron and TIBC     Status: Abnormal   Collection Time: 07/21/20 11:06 AM  Result Value Ref Range   Iron 174 (H) 28 - 170 ug/dL   TIBC 210 (L) 250 - 450 ug/dL   Saturation Ratios 83 (H) 10.4 - 31.8 %   UIBC 36 ug/dL    Comment: Performed at [facility], [address] [place], [place] [address]  Ferritin     Status: None   Collection Time: 07/21/20 11:06 AM  Result Value Ref Range   Ferritin 30 11 - 307 ng/mL    Comment: Performed at [facility], [address] [place], [place] [address]  CBC with Differential/Platelet     Status: Abnormal   Collection Time: 07/21/20 11:06 AM  Result Value Ref Range   WBC 5.8 4.0 - 10.5 K/uL   RBC 3.70 (L) 3.87 - 5.11 MIL/uL   Hemoglobin 12.4 12.0 - 15.0 g/dL   HCT 36.9 36.0 - 46.0 %   MCV 99.7 80.0 - 100.0 fL   MCH 33.5 26.0 - 34.0 pg   MCHC 33.6 30.0 - 36.0 g/dL   RDW 12.5 11.5 - 15.5 %   Platelets 279 150 - 400 K/uL   nRBC 0.0 0.0 - 0.2 %   Neutrophils Relative % 55 %   Neutro Abs 3.2 1.7 - 7.7 K/uL   Lymphocytes Relative 33 %   Lymphs Abs 1.9 0.7 - 4.0 K/uL   Monocytes Relative 9 %   Monocytes Absolute 0.5 0.1 - 1.0 K/uL   Eosinophils Relative 2 %   Eosinophils Absolute 0.1 0.0 - 0.5 K/uL   Basophils  Relative 1 %   Basophils Absolute 0.0 0.0 - 0.1 K/uL   Immature Granulocytes 0 %   Abs Immature Granulocytes 0.01 0.00 - 0.07 K/uL    Comment: Performed at [facility], [address], [place], [place] [address]    Radiology: MM 3D SCREEN BREAST BILATERAL  Result Date: 10/11/2019 CLINICAL DATA:  Screening. EXAM: DIGITAL SCREENING BILATERAL MAMMOGRAM WITH TOMO AND CAD COMPARISON:  Previous exam(s). ACR Breast Density Category b: There are scattered areas of fibroglandular density. FINDINGS: There are no findings suspicious for malignancy. Images were processed with CAD. IMPRESSION: No mammographic evidence of malignancy. A result letter of this screening mammogram will be mailed directly to the patient. RECOMMENDATION:  Screening mammogram in one year. (Code:SM-B-01Y) BI-RADS CATEGORY  1: Negative. Electronically Signed   By: Lillia Mountain M.D.   On: 10/11/2019 10:00    No results found.  No results found.    Assessment and Plan: Patient Active Problem List   Diagnosis Date Noted  . Encounter for general adult medical examination with abnormal findings 09/25/2019  . Encounter for screening mammogram for malignant neoplasm of breast 09/25/2019  . Dysuria 10/01/2018  . Acquired hypothyroidism 06/12/2018  . Routine cervical smear 06/12/2018  . Needs flu shot 02/04/2018  . Essential hypertension 05/31/2017  . Sleep apnea 05/31/2017  . Spina bifida ([place]) 05/31/2017  . Hemochromatosis 11/28/2014  . Word finding difficulty 02/28/2013  . Seizure disorder ([place]) 02/04/2013  . Neurogenic bladder 02/04/2013  . Left ankle sprain 09/29/2011  . Left leg pain 09/29/2011    1. OSA on CPAP Will get scheduled to have repeat download since pressure adjustment, encouraged to continue with nightly CPAP use  2. CPAP use counseling Discussed importance of adequate CPAP use as well as proper care and cleaning techniques of machine and all supplies.  3. Essential hypertension Well controlled,  continue to monitor  4. Acquired hypothyroidism Controlled with supplemental thyroid, followed by PCP  General Counseling: I have discussed the findings of the evaluation and examination with Sheppard Evens.  I have also discussed any further diagnostic evaluation thatmay be needed or ordered today. Alyssia verbalizes understanding of the findings of todays visit. We also reviewed her medications today and discussed drug interactions and side effects including but not limited excessive drowsiness and altered mental states. We also discussed that there is always a risk not just to her but also people around her. she has been encouraged to call the office with any questions or concerns that should arise related to todays visit.   Time spent: 25  I have personally obtained a history, examined the patient, evaluated laboratory and imaging results, formulated the assessment and plan and placed orders. This patient was seen by Theodoro Grist, AGNP-C in collaboration with Dr. Devona Konig as a part of collaborative care agreement.     Allyne Gee, MD [facility] Pulmonary and Critical Care Sleep medicine

## 2020-09-11 NOTE — Patient Instructions (Signed)

## 2020-09-14 ENCOUNTER — Ambulatory Visit: Payer: PRIVATE HEALTH INSURANCE | Admitting: Nurse Practitioner

## 2020-09-21 ENCOUNTER — Encounter: Payer: Self-pay | Admitting: Internal Medicine

## 2020-09-21 ENCOUNTER — Ambulatory Visit (INDEPENDENT_AMBULATORY_CARE_PROVIDER_SITE_OTHER): Payer: PRIVATE HEALTH INSURANCE | Admitting: Internal Medicine

## 2020-09-21 ENCOUNTER — Other Ambulatory Visit: Payer: Self-pay

## 2020-09-21 VITALS — BP 130/74 | HR 70 | Temp 97.5°F | Resp 16 | Ht <= 58 in | Wt 118.0 lb

## 2020-09-21 DIAGNOSIS — E039 Hypothyroidism, unspecified: Secondary | ICD-10-CM

## 2020-09-21 DIAGNOSIS — R7301 Impaired fasting glucose: Secondary | ICD-10-CM

## 2020-09-21 DIAGNOSIS — Z1231 Encounter for screening mammogram for malignant neoplasm of breast: Secondary | ICD-10-CM | POA: Diagnosis not present

## 2020-09-21 DIAGNOSIS — Z0001 Encounter for general adult medical examination with abnormal findings: Secondary | ICD-10-CM | POA: Diagnosis not present

## 2020-09-21 DIAGNOSIS — G4733 Obstructive sleep apnea (adult) (pediatric): Secondary | ICD-10-CM | POA: Diagnosis not present

## 2020-09-21 DIAGNOSIS — G40909 Epilepsy, unspecified, not intractable, without status epilepticus: Secondary | ICD-10-CM

## 2020-09-21 DIAGNOSIS — Z9989 Dependence on other enabling machines and devices: Secondary | ICD-10-CM

## 2020-09-21 LAB — POCT CBG (FASTING - GLUCOSE)-MANUAL ENTRY: Glucose Fasting, POC: 72 mg/dL (ref 70–99)

## 2020-09-21 LAB — POCT GLYCOSYLATED HEMOGLOBIN (HGB A1C): Hemoglobin A1C: 4.5 % (ref 4.0–5.6)

## 2020-09-21 NOTE — Progress Notes (Signed)
[facility], [place] [address]  Internal MEDICINE  Office Visit Note  Patient Name: Christina Stephens  [id number]  [id number]  Date of Service: 09/25/2020  Chief Complaint  Patient presents with  . Annual Exam  . Sleep Apnea  . Hypertension     HPI Pt is here for routine health maintenance examination. Feels well overall. Pt has h/o spinal bifida.  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  She has a history of shunted hydrocephalus and lumbar myelo meningocele which is followed by neurosurgery in the spinal Bifiteral clinic patient is currently on Dilantin and followed by neurology. Patient is also followed by hematology for hereditary hemochromatosis. She also has history of obstructive sleep apnea however she has lost weight and will probably need to have a repeat sleep study. Patient takes Lopressor 1 tablet twice a day for tachycardia.  Her blood pressure is slightly low today. Patient is on Synthroid for hypothyroidism, patient is due for to have a baseline mammogram since she is 16 and had never done one.  No acute problems  Current Medication: Outpatient Encounter Medications as of 09/21/2020  Medication Sig Note  . Calcium Carbonate-Vitamin D 600-200 MG-UNIT TABS Take by mouth.   Marland Kitchen CALCIUM-VITAMIN D PO Take 1 tablet by mouth daily.   Marland Kitchen levothyroxine (SYNTHROID) 50 MCG tablet Take 1 tablet (50 mcg total) by mouth daily.   . metoprolol tartrate (LOPRESSOR) 25 MG tablet TAKE 1 TABLET BY MOUTH TWICE A DAY   . phenytoin (DILANTIN) 100 MG ER capsule TAKE 2 CAPSULES (200 MG TOTAL) BY MOUTH 2 (TWO) TIMES DAILY. 01/02/2015: Received from: External Pharmacy  . polyethylene glycol (MIRALAX / GLYCOLAX) packet 1 POWDER PACK IN 12 OZ WATER DAILY FOR CONSTIPATION    Facility-Administered Encounter Medications as of 09/21/2020  Medication  . sodium chloride flush (NS) 0.9 % injection 10 mL  . sodium chloride flush  (NS) 0.9 % injection 10 mL  . sodium chloride flush (NS) 0.9 % injection 10 mL    Surgical History: Past Surgical History:  Procedure Laterality Date  . REDUCTION MAMMAPLASTY Bilateral 1993  . VP STUNT HEAD SURGERYOther]  2000    Medical History: Past Medical History:  Diagnosis Date  . CPAP (continuous positive airway pressure) dependence   . Hereditary hemochromatosis ([place])   . Hypertension   . Seizures ([place])   . Sleep apnea   . Spina bifida ([place])   . Thyroid disease    hypothyroidism    Family History: Family History  Problem Relation Age of Onset  . Ovarian cancer Maternal Aunt   . Hypertension Father   . CAD Father   . Arthritis Sister   . CAD Paternal Grandfather   . Breast cancer Maternal Grandmother     Social History: Social History   Socioeconomic History  . Marital status: Single    Spouse name: Not on file  . Number of children: Not on file  . Years of education: Not on file  . Highest education level: Not on file  Occupational History  . Not on file  Tobacco Use  . Smoking status: Never Smoker  . Smokeless tobacco: Never Used  Substance and Sexual Activity  . Alcohol use: No  . Drug use: No  . Sexual activity: Not on file  Other Topics Concern  . Not on file  Social History Narrative  . Not on file   Social Determinants of Health   Financial Resource  Strain: Not on file  Food Insecurity: Not on file  Transportation Needs: Not on file  Physical Activity: Not on file  Stress: Not on file  Social Connections: Not on file      Review of Systems  Constitutional: Negative for chills, diaphoresis and fatigue.  HENT: Negative for ear pain, postnasal drip and sinus pressure.   Eyes: Negative for photophobia, discharge, redness, itching and visual disturbance.  Respiratory: Negative for cough, shortness of breath and wheezing.   Cardiovascular: Negative for chest pain, palpitations and leg swelling.  Gastrointestinal: Negative for  abdominal pain, constipation, diarrhea, nausea and vomiting.  Genitourinary: Negative for dysuria and flank pain.  Musculoskeletal: Negative for arthralgias, back pain, gait problem and neck pain.  Skin: Negative for color change.  Allergic/Immunologic: Negative for environmental allergies and food allergies.  Neurological: Negative for dizziness and headaches.  Hematological: Does not bruise/bleed easily.  Psychiatric/Behavioral: Negative for agitation, behavioral problems (depression) and hallucinations.     Vital Signs: BP 130/74   Pulse 70   Temp (!) 97.5 F (36.4 C)   Resp 16   Ht 4' 7"  (1.397 m)   Wt 118 lb (53.5 kg)   SpO2 98%   BMI 27.43 kg/m    Physical Exam Constitutional:      General: She is not in acute distress.    Appearance: She is well-developed. She is not diaphoretic.  HENT:     Head: Normocephalic and atraumatic.     Mouth/Throat:     Pharynx: No oropharyngeal exudate.  Eyes:     Pupils: Pupils are equal, round, and reactive to light.  Neck:     Thyroid: No thyromegaly.     Vascular: No JVD.     Trachea: No tracheal deviation.  Cardiovascular:     Rate and Rhythm: Normal rate and regular rhythm.     Heart sounds: Normal heart sounds. No murmur heard. No friction rub. No gallop.   Pulmonary:     Effort: Pulmonary effort is normal. No respiratory distress.     Breath sounds: No wheezing or rales.  Chest:     Chest wall: No tenderness.  Breasts:     Right: Normal.     Left: Normal.    Abdominal:     General: Bowel sounds are normal.     Palpations: Abdomen is soft.  Musculoskeletal:        General: Normal range of motion.     Cervical back: Normal range of motion and neck supple.  Lymphadenopathy:     Cervical: No cervical adenopathy.  Skin:    General: Skin is warm and dry.  Neurological:     Mental Status: She is alert and oriented to person, place, and time.     Cranial Nerves: No cranial nerve deficit.  Psychiatric:         Behavior: Behavior normal.        Thought Content: Thought content normal.        Judgment: Judgment normal.      LABS: Recent Results (from the past 2160 hour(s))  Iron and TIBC     Status: Abnormal   Collection Time: 07/21/20 11:06 AM  Result Value Ref Range   Iron 174 (H) 28 - 170 ug/dL   TIBC 210 (L) 250 - 450 ug/dL   Saturation Ratios 83 (H) 10.4 - 31.8 %   UIBC 36 ug/dL    Comment: Performed at [facility], [address] [place], [place] [address]  Ferritin  Status: None   Collection Time: 07/21/20 11:06 AM  Result Value Ref Range   Ferritin 30 11 - 307 ng/mL    Comment: Performed at [facility], [address] [place], [place] [address]  CBC with Differential/Platelet     Status: Abnormal   Collection Time: 07/21/20 11:06 AM  Result Value Ref Range   WBC 5.8 4.0 - 10.5 K/uL   RBC 3.70 (L) 3.87 - 5.11 MIL/uL   Hemoglobin 12.4 12.0 - 15.0 g/dL   HCT 36.9 36.0 - 46.0 %   MCV 99.7 80.0 - 100.0 fL   MCH 33.5 26.0 - 34.0 pg   MCHC 33.6 30.0 - 36.0 g/dL   RDW 12.5 11.5 - 15.5 %   Platelets 279 150 - 400 K/uL   nRBC 0.0 0.0 - 0.2 %   Neutrophils Relative % 55 %   Neutro Abs 3.2 1.7 - 7.7 K/uL   Lymphocytes Relative 33 %   Lymphs Abs 1.9 0.7 - 4.0 K/uL   Monocytes Relative 9 %   Monocytes Absolute 0.5 0.1 - 1.0 K/uL   Eosinophils Relative 2 %   Eosinophils Absolute 0.1 0.0 - 0.5 K/uL   Basophils Relative 1 %   Basophils Absolute 0.0 0.0 - 0.1 K/uL   Immature Granulocytes 0 %   Abs Immature Granulocytes 0.01 0.00 - 0.07 K/uL    Comment: Performed at [facility], [place], [place] [address]  POCT HgB A1C     Status: None   Collection Time: 09/21/20  3:41 PM  Result Value Ref Range   Hemoglobin A1C 4.5 4.0 - 5.6 %   HbA1c POC (<> result, manual entry)     HbA1c, POC (prediabetic range)     HbA1c, POC (controlled diabetic range)    POCT CBG (Fasting - Glucose)     Status: None   Collection Time: 09/21/20  3:41 PM   Result Value Ref Range   Glucose Fasting, POC 72 70 - 99 mg/dL      Assessment/Plan: 1. Encounter for general adult medical examination with abnormal findings Preventive health maintenance is updated according to her age and guidelines   2. Encounter for mammogram to establish baseline mammogram Baseline mammogram is ordered today - MM DIGITAL SCREENING BILATERAL; Future  3. Impaired fasting blood sugar I noticed on previous to laboratory blood triage her glucose was elevated we will check hemoglobin A1c today since then patient has lost weight - Lipid Panel With LDL/HDL Ratio - TSH + free T4 - Comprehensive metabolic panel  4. OSA on CPAP Patient is to continue on on CPAP however might need a repeat sleep study  5. Acquired hypothyroidism Continue Synthroid as before adjust medication after labs are available   6. Hereditary hemochromatosis ([place]) Patient is followed by hematology - Comprehensive metabolic panel  7. Seizure disorder [facility]) This is followed by neurology.  Patient is on Dilantin  General Counseling: Richanda verbalizes understanding of the findings of todays visit and agrees with plan of treatment. I have discussed any further diagnostic evaluation that may be needed or ordered today. We also reviewed her medications today. she has been encouraged to call the office with any questions or concerns that should arise related to todays visit.   Orders Placed This Encounter  Procedures  . MM DIGITAL SCREENING BILATERAL  . Lipid Panel With LDL/HDL Ratio  . TSH + free T4  . Comprehensive metabolic panel  . POCT HgB A1C  . POCT CBG (Fasting -  Glucose)    No orders of the defined types were placed in this encounter.   Total time spent:*35Minutes  Time spent includes review of chart, medications, test results, and follow up plan with the patient.     [name], MD  Internal Medicine

## 2020-09-30 ENCOUNTER — Other Ambulatory Visit: Payer: Self-pay

## 2020-09-30 ENCOUNTER — Ambulatory Visit (INDEPENDENT_AMBULATORY_CARE_PROVIDER_SITE_OTHER): Payer: PRIVATE HEALTH INSURANCE

## 2020-09-30 DIAGNOSIS — G4733 Obstructive sleep apnea (adult) (pediatric): Secondary | ICD-10-CM

## 2020-09-30 NOTE — Progress Notes (Signed)
95 percentile pressure 12.1   95th percentile leak 0.24   apnea-hypopnea index  12.1 /hr   total days used  >4 hr 30 days  total days used <4 hr 0 days  Total compliance 100 percent

## 2020-10-07 LAB — LIPID PANEL WITH LDL/HDL RATIO
Cholesterol, Total: 147 mg/dL (ref 100–199)
HDL: 63 mg/dL (ref >39)
LDL Chol Calc (NIH): 72 mg/dL (ref 0–99)
LDL/HDL Ratio: 1.1 ratio (ref 0.0–3.2)
Triglycerides: 58 mg/dL (ref 0–149)
VLDL Cholesterol Cal: 12 mg/dL (ref 5–40)

## 2020-10-07 LAB — COMPREHENSIVE METABOLIC PANEL
ALT: 17 IU/L (ref 0–32)
AST: 17 IU/L (ref 0–40)
Albumin/Globulin Ratio: 1.5 (ref 1.2–2.2)
Albumin: 4.3 g/dL (ref 3.8–4.8)
Alkaline Phosphatase: 71 IU/L (ref 44–121)
BUN/Creatinine Ratio: 28 — ABNORMAL HIGH (ref 9–23)
BUN: 18 mg/dL (ref 6–24)
Bilirubin Total: <0.2 mg/dL (ref 0.0–1.2)
CO2: 22 mmol/L (ref 20–29)
Calcium: 9.3 mg/dL (ref 8.7–10.2)
Chloride: 104 mmol/L (ref 96–106)
Creatinine, Ser: 0.65 mg/dL (ref 0.57–1.00)
Globulin, Total: 2.8 g/dL (ref 1.5–4.5)
Glucose: 82 mg/dL (ref 65–99)
Potassium: 4.6 mmol/L (ref 3.5–5.2)
Sodium: 140 mmol/L (ref 134–144)
Total Protein: 7.1 g/dL (ref 6.0–8.5)
eGFR: 111 mL/min/{1.73_m2} (ref >59)

## 2020-10-07 LAB — TSH+FREE T4
Free T4: 1.22 ng/dL (ref 0.82–1.77)
TSH: 0.652 u[IU]/mL (ref 0.450–4.500)

## 2020-10-14 ENCOUNTER — Inpatient Hospital Stay

## 2020-10-14 ENCOUNTER — Inpatient Hospital Stay: Attending: Oncology

## 2020-10-14 DIAGNOSIS — Z95828 Presence of other vascular implants and grafts: Secondary | ICD-10-CM

## 2020-10-14 DIAGNOSIS — Z452 Encounter for adjustment and management of vascular access device: Secondary | ICD-10-CM | POA: Diagnosis present

## 2020-10-14 MED ORDER — SODIUM CHLORIDE 0.9% FLUSH
10.0000 mL | INTRAVENOUS | Status: DC | PRN
Start: 2020-10-14 — End: 2020-10-14
  Administered 2020-10-14: 10 mL via INTRAVENOUS
  Filled 2020-10-14: qty 10

## 2020-10-14 MED ORDER — HEPARIN SOD (PORK) LOCK FLUSH 100 UNIT/ML IV SOLN
500.0000 [IU] | Freq: Once | INTRAVENOUS | Status: AC
  Administered 2020-10-14: 500 [IU] via INTRAVENOUS
  Filled 2020-10-14: qty 5

## 2020-10-14 MED ORDER — HEPARIN SOD (PORK) LOCK FLUSH 100 UNIT/ML IV SOLN
INTRAVENOUS | Status: AC
  Filled 2020-10-14: qty 5

## 2020-11-03 ENCOUNTER — Ambulatory Visit (INDEPENDENT_AMBULATORY_CARE_PROVIDER_SITE_OTHER): Payer: PRIVATE HEALTH INSURANCE | Admitting: Nurse Practitioner

## 2020-11-03 ENCOUNTER — Encounter: Payer: Self-pay | Admitting: Nurse Practitioner

## 2020-11-03 ENCOUNTER — Other Ambulatory Visit: Payer: Self-pay

## 2020-11-03 VITALS — BP 125/77 | HR 77 | Temp 98.1°F | Resp 16 | Ht <= 58 in | Wt 118.0 lb

## 2020-11-03 DIAGNOSIS — Z711 Person with feared health complaint in whom no diagnosis is made: Secondary | ICD-10-CM | POA: Diagnosis not present

## 2020-11-03 DIAGNOSIS — I10 Essential (primary) hypertension: Secondary | ICD-10-CM | POA: Diagnosis not present

## 2020-11-03 DIAGNOSIS — E039 Hypothyroidism, unspecified: Secondary | ICD-10-CM

## 2020-11-03 NOTE — Progress Notes (Signed)
[facility] [place], [place] [address]  Internal MEDICINE  Office Visit Note  Patient Name: Christina Stephens  [id number]  [id number]  Date of Service: 11/11/2020  Chief Complaint  Patient presents with   Follow-up    Review labs   Obstructive Sleep Apnea   Hypertension    HPI Christina Stephens presents for a follow up visit to discuss lab results, OSA and hypertension. The patient has family members with chronic medical conditions like diabetes. She is worried that she has something going on htat has not been diagnosed.  -discussed lab results: TSH and free T4 was both normal. Lipid panel is also normal. CMP was normal except for the BUN/creatinine ratio. RBC is low at 3.70 but all other values in the CBC are wnl.    Current Medication: Outpatient Encounter Medications as of 11/03/2020  Medication Sig Note   Calcium Carbonate-Vitamin D 600-200 MG-UNIT TABS Take by mouth.    CALCIUM-VITAMIN D PO Take 1 tablet by mouth daily.    levothyroxine (SYNTHROID) 50 MCG tablet Take 1 tablet (50 mcg total) by mouth daily.    metoprolol tartrate (LOPRESSOR) 25 MG tablet TAKE 1 TABLET BY MOUTH TWICE A DAY    phenytoin (DILANTIN) 100 MG ER capsule TAKE 2 CAPSULES (200 MG TOTAL) BY MOUTH 2 (TWO) TIMES DAILY. 01/02/2015: Received from: External Pharmacy   polyethylene glycol (MIRALAX / GLYCOLAX) packet 1 POWDER PACK IN 12 OZ WATER DAILY FOR CONSTIPATION (Patient not taking: Reported on 11/03/2020)    Facility-Administered Encounter Medications as of 11/03/2020  Medication   sodium chloride flush (NS) 0.9 % injection 10 mL   sodium chloride flush (NS) 0.9 % injection 10 mL   sodium chloride flush (NS) 0.9 % injection 10 mL    Surgical History: Past Surgical History:  Procedure Laterality Date   REDUCTION MAMMAPLASTY Bilateral 1993   VP STUNT HEAD SURGERYOther]  2000    Medical History: Past Medical History:  Diagnosis Date   CPAP (continuous positive airway pressure)  dependence    Hereditary hemochromatosis (H[facility])    Hypertension    Seizures ([place])    Sleep apnea    Spina bifida ([place])    Thyroid disease    hypothyroidism    Family History: Family History  Problem Relation Age of Onset   Hypertension Father    CAD Father    Diabetes Sister    Arthritis Sister    Breast cancer Maternal Grandmother    CAD Paternal Grandfather    Ovarian cancer Maternal Aunt     Social History   Socioeconomic History   Marital status: Single    Spouse name: Not on file   Number of children: Not on file   Years of education: Not on file   Highest education level: Not on file  Occupational History   Not on file  Tobacco Use   Smoking status: Never   Smokeless tobacco: Never  Substance and Sexual Activity   Alcohol use: No   Drug use: No   Sexual activity: Not on file  Other Topics Concern   Not on file  Social History Narrative   Not on file   Social Determinants of Health   Financial Resource Strain: Not on file  Food Insecurity: Not on file  Transportation Needs: Not on file  Physical Activity: Not on file  Stress: Not on file  Social Connections: Not on file  Intimate Partner Violence: Not on file      Review of  Systems  Constitutional:  Negative for chills, fatigue and unexpected weight change.  HENT:  Negative for congestion, rhinorrhea, sneezing and sore throat.   Eyes:  Negative for redness.  Respiratory:  Negative for cough, chest tightness and shortness of breath.   Cardiovascular:  Negative for chest pain and palpitations.  Gastrointestinal:  Negative for abdominal pain, constipation, diarrhea, nausea and vomiting.  Genitourinary:  Negative for dysuria and frequency.  Musculoskeletal:  Negative for arthralgias, back pain, joint swelling and neck pain.  Skin:  Negative for rash.  Neurological: Negative.  Negative for tremors and numbness.  Hematological:  Negative for adenopathy. Does not bruise/bleed easily.   Psychiatric/Behavioral:  Negative for behavioral problems (Depression), sleep disturbance and suicidal ideas. The patient is not nervous/anxious.    Vital Signs: BP 125/77   Pulse 77   Temp 98.1 F (36.7 C)   Resp 16   Ht 4' 7"  (1.397 m)   Wt 118 lb (53.5 kg)   SpO2 99%   BMI 27.43 kg/m    Physical Exam Vitals reviewed.  Constitutional:      General: She is not in acute distress.    Appearance: Normal appearance. She is not ill-appearing.  Cardiovascular:     Rate and Rhythm: Normal rate and regular rhythm.     Pulses: Normal pulses.     Heart sounds: Normal heart sounds.  Pulmonary:     Effort: Pulmonary effort is normal.     Breath sounds: Normal breath sounds.  Skin:    General: Skin is warm and dry.     Capillary Refill: Capillary refill takes less than 2 seconds.  Neurological:     Mental Status: She is alert and oriented to person, place, and time.    Assessment/Plan: 1. Person with feared health complaint in whom no diagnosis is made Patient is worried she is developing additional medical problems. She has family members with diabetes and seems to fixate on this. Her labs were discussed and reassurance was provided. She reports feeling relieved after her lab results were discussed.   2. Essential hypertension Stable on current medication  3. Acquired hypothyroidism Stable on current medication.    General Counseling: Christina Stephens verbalizes understanding of the findings of todays visit and agrees with plan of treatment. I have discussed any further diagnostic evaluation that may be needed or ordered today. We also reviewed her medications today. she has been encouraged to call the office with any questions or concerns that should arise related to todays visit.    No orders of the defined types were placed in this encounter.   No orders of the defined types were placed in this encounter.   Return in 4 months (on 03/15/2021) for previously scheduled visit with  lauren PA .   Total time spent:30 Minutes Time spent includes review of chart, medications, test results, and follow up plan with the patient.   [place] Controlled Substance Database was reviewed by me.  This patient was seen by Jonetta Osgood, FNP-C in collaboration with Dr. Clayborn Bigness as a part of collaborative care agreement.   Kamryn Messineo R. Valetta Fuller, MSN, FNP-C Internal medicine

## 2020-11-24 ENCOUNTER — Encounter: Payer: Self-pay | Admitting: Oncology

## 2020-11-25 ENCOUNTER — Inpatient Hospital Stay: Attending: Oncology

## 2020-11-25 ENCOUNTER — Inpatient Hospital Stay

## 2020-11-25 DIAGNOSIS — Z95828 Presence of other vascular implants and grafts: Secondary | ICD-10-CM

## 2020-11-25 DIAGNOSIS — Z452 Encounter for adjustment and management of vascular access device: Secondary | ICD-10-CM | POA: Diagnosis present

## 2020-11-25 MED ORDER — HEPARIN SOD (PORK) LOCK FLUSH 100 UNIT/ML IV SOLN
500.0000 [IU] | Freq: Once | INTRAVENOUS | Status: AC
  Administered 2020-11-25: 500 [IU]
  Filled 2020-11-25: qty 5

## 2020-11-25 MED ORDER — SODIUM CHLORIDE 0.9% FLUSH
10.0000 mL | Freq: Once | INTRAVENOUS | Status: AC
  Administered 2020-11-25: 10 mL
  Filled 2020-11-25: qty 10

## 2020-11-25 MED ORDER — HEPARIN SOD (PORK) LOCK FLUSH 100 UNIT/ML IV SOLN
INTRAVENOUS | Status: AC
  Filled 2020-11-25: qty 5

## 2021-01-04 ENCOUNTER — Other Ambulatory Visit: Payer: Self-pay | Admitting: Internal Medicine

## 2021-01-04 DIAGNOSIS — I10 Essential (primary) hypertension: Secondary | ICD-10-CM

## 2021-01-06 ENCOUNTER — Inpatient Hospital Stay

## 2021-01-06 ENCOUNTER — Inpatient Hospital Stay: Attending: Oncology

## 2021-01-06 DIAGNOSIS — Z452 Encounter for adjustment and management of vascular access device: Secondary | ICD-10-CM | POA: Diagnosis present

## 2021-01-06 DIAGNOSIS — Z95828 Presence of other vascular implants and grafts: Secondary | ICD-10-CM

## 2021-01-06 MED ORDER — SODIUM CHLORIDE 0.9% FLUSH
10.0000 mL | Freq: Once | INTRAVENOUS | Status: AC
  Administered 2021-01-06: 10 mL via INTRAVENOUS
  Filled 2021-01-06: qty 10

## 2021-01-06 MED ORDER — HEPARIN SOD (PORK) LOCK FLUSH 100 UNIT/ML IV SOLN
500.0000 [IU] | Freq: Once | INTRAVENOUS | Status: AC
  Administered 2021-01-06: 500 [IU] via INTRAVENOUS
  Filled 2021-01-06: qty 5

## 2021-01-20 ENCOUNTER — Other Ambulatory Visit: Payer: Self-pay

## 2021-01-20 ENCOUNTER — Ambulatory Visit: Payer: PRIVATE HEALTH INSURANCE

## 2021-01-21 ENCOUNTER — Inpatient Hospital Stay: Attending: Oncology

## 2021-01-21 ENCOUNTER — Other Ambulatory Visit

## 2021-01-21 ENCOUNTER — Ambulatory Visit: Admitting: Oncology

## 2021-01-21 DIAGNOSIS — I10 Essential (primary) hypertension: Secondary | ICD-10-CM | POA: Diagnosis not present

## 2021-01-21 DIAGNOSIS — Q059 Spina bifida, unspecified: Secondary | ICD-10-CM | POA: Insufficient documentation

## 2021-01-21 DIAGNOSIS — E039 Hypothyroidism, unspecified: Secondary | ICD-10-CM | POA: Insufficient documentation

## 2021-01-21 DIAGNOSIS — G4730 Sleep apnea, unspecified: Secondary | ICD-10-CM | POA: Insufficient documentation

## 2021-01-21 DIAGNOSIS — Z79899 Other long term (current) drug therapy: Secondary | ICD-10-CM | POA: Insufficient documentation

## 2021-01-21 DIAGNOSIS — G40909 Epilepsy, unspecified, not intractable, without status epilepticus: Secondary | ICD-10-CM | POA: Insufficient documentation

## 2021-01-21 LAB — CBC WITH DIFFERENTIAL/PLATELET
Abs Immature Granulocytes: 0.02 10*3/uL (ref 0.00–0.07)
Basophils Absolute: 0.1 10*3/uL (ref 0.0–0.1)
Basophils Relative: 1 %
Eosinophils Absolute: 0.2 10*3/uL (ref 0.0–0.5)
Eosinophils Relative: 3 %
HCT: 38 % (ref 36.0–46.0)
Hemoglobin: 12.7 g/dL (ref 12.0–15.0)
Immature Granulocytes: 0 %
Lymphocytes Relative: 32 %
Lymphs Abs: 2.3 10*3/uL (ref 0.7–4.0)
MCH: 32.7 pg (ref 26.0–34.0)
MCHC: 33.4 g/dL (ref 30.0–36.0)
MCV: 97.9 fL (ref 80.0–100.0)
Monocytes Absolute: 0.7 10*3/uL (ref 0.1–1.0)
Monocytes Relative: 9 %
Neutro Abs: 3.9 10*3/uL (ref 1.7–7.7)
Neutrophils Relative %: 55 %
Platelets: 268 10*3/uL (ref 150–400)
RBC: 3.88 MIL/uL (ref 3.87–5.11)
RDW: 12.2 % (ref 11.5–15.5)
WBC: 7.1 10*3/uL (ref 4.0–10.5)
nRBC: 0 % (ref 0.0–0.2)

## 2021-01-21 LAB — IRON AND TIBC
Iron: 172 ug/dL — ABNORMAL HIGH (ref 28–170)
Saturation Ratios: 84 % — ABNORMAL HIGH (ref 10.4–31.8)
TIBC: 206 ug/dL — ABNORMAL LOW (ref 250–450)
UIBC: 34 ug/dL

## 2021-01-21 LAB — FERRITIN: Ferritin: 32 ng/mL (ref 11–307)

## 2021-01-22 ENCOUNTER — Inpatient Hospital Stay (HOSPITAL_BASED_OUTPATIENT_CLINIC_OR_DEPARTMENT_OTHER): Admitting: Oncology

## 2021-01-22 ENCOUNTER — Ambulatory Visit: Admitting: Oncology

## 2021-01-22 ENCOUNTER — Encounter: Payer: Self-pay | Admitting: Oncology

## 2021-01-22 ENCOUNTER — Inpatient Hospital Stay

## 2021-01-22 ENCOUNTER — Encounter

## 2021-01-22 MED ORDER — SODIUM CHLORIDE 0.9% FLUSH
10.0000 mL | INTRAVENOUS | Status: DC | PRN
  Administered 2021-01-22: 10 mL via INTRAVENOUS
  Filled 2021-01-22: qty 10

## 2021-01-22 MED ORDER — HEPARIN SOD (PORK) LOCK FLUSH 100 UNIT/ML IV SOLN
500.0000 [IU] | Freq: Once | INTRAVENOUS | Status: AC
  Administered 2021-01-22: 500 [IU] via INTRAVENOUS
  Filled 2021-01-22: qty 5

## 2021-01-22 NOTE — Progress Notes (Signed)
[facility]  Telephone:(336) [phone] Fax:(336) [phone]  ID: Christina Stephens OB: 08/02/1975  MR#: [id number]  QIH#:[id number]  Patient Care Team: [name], MD as PCP - General (Internal Medicine) Lloyd Huger, MD as Consulting Physician (Oncology)  CHIEF COMPLAINT: Hereditary hemochromatosis.  History of present illness: Christina Stephens is a 45 year old female with past medical history significant for hypertension, sleep apnea, hypothyroidism, spina bifida, seizure disorder who is followed by Dr. Grayland Ormond for hereditary hemochromatosis.  She receives intermittent phlebotomy last on 07/22/2020.  She is followed closely by her PCP Dr. Chancy Milroy.  She was last seen on 09/21/2020 for chronic comorbidities.  Everything appears stable.  She is due for a mammogram.  She is scheduled for a repeat sleep study due to weight loss.  This is scheduled for November 2022.   INTERVAL HISTORY: Patient returns to clinic today to review lab work and discuss possible phlebotomy.  She denies any new concerns today.  Feels well.   REVIEW OF SYSTEMS:   Review of Systems  All other systems reviewed and are negative.  As per HPI. Otherwise, a complete review of systems is negative.  PAST MEDICAL HISTORY: Past Medical History:  Diagnosis Date   CPAP (continuous positive airway pressure) dependence    Hereditary hemochromatosis ([place])    Hypertension    Seizures (H[facility])    Sleep apnea    Spina bifida ([place])    Thyroid disease    hypothyroidism    PAST SURGICAL HISTORY: Past Surgical History:  Procedure Laterality Date   REDUCTION MAMMAPLASTY Bilateral 1993   VP STUNT HEAD SURGERYOther]  2000    FAMILY HISTORY: Family History  Problem Relation Age of Onset   Hypertension Father    CAD Father    Diabetes Sister    Arthritis Sister    Breast cancer Maternal Grandmother    CAD Paternal Grandfather    Ovarian cancer Maternal Aunt     ADVANCED DIRECTIVES (Y/N):  N  HEALTH  MAINTENANCE: Social History   Tobacco Use   Smoking status: Never   Smokeless tobacco: Never  Substance Use Topics   Alcohol use: No   Drug use: No     Colonoscopy:  PAP:  Bone density:  Lipid panel:  Allergies  Allergen Reactions   Latex     Pt has Spina bifada and was told that if she has surgery she should avoid latex for poss. Reaction/infection.    Current Outpatient Medications  Medication Sig Dispense Refill   Calcium Carbonate-Vitamin D 600-200 MG-UNIT TABS Take by mouth.     CALCIUM-VITAMIN D PO Take 1 tablet by mouth daily.     levothyroxine (SYNTHROID) 50 MCG tablet Take 1 tablet (50 mcg total) by mouth daily. 90 tablet 3   metoprolol tartrate (LOPRESSOR) 25 MG tablet TAKE 1 TABLET BY MOUTH TWICE A DAY 180 tablet 1   phenytoin (DILANTIN) 100 MG ER capsule TAKE 2 CAPSULES (200 MG TOTAL) BY MOUTH 2 (TWO) TIMES DAILY.  11   polyethylene glycol (MIRALAX / GLYCOLAX) packet 1 POWDER PACK IN 12 OZ WATER DAILY FOR CONSTIPATION (Patient not taking: Reported on 11/03/2020)     No current facility-administered medications for this visit.   Facility-Administered Medications Ordered in Other Visits  Medication Dose Route Frequency Provider Last Rate Last Admin   sodium chloride flush (NS) 0.9 % injection 10 mL  10 mL Intravenous PRN Cammie Sickle, MD   10 mL at 02/26/16 0917   sodium chloride flush (NS)  0.9 % injection 10 mL  10 mL Intravenous PRN Nolon Stalls C, MD   10 mL at 01/01/18 1108   sodium chloride flush (NS) 0.9 % injection 10 mL  10 mL Intravenous PRN Lloyd Huger, MD   10 mL at 04/22/20 1100    OBJECTIVE: There were no vitals filed for this visit.    There is no height or weight on file to calculate BMI.    ECOG FS:0 - Asymptomatic  Physical Exam Constitutional:      Appearance: Normal appearance.     Comments: Ambulates with crutches d/t spina bifida  HENT:     Head: Normocephalic and atraumatic.  Eyes:     Pupils: Pupils are equal,  round, and reactive to light.  Cardiovascular:     Rate and Rhythm: Normal rate and regular rhythm.     Heart sounds: Normal heart sounds. No murmur heard. Pulmonary:     Effort: Pulmonary effort is normal.     Breath sounds: Normal breath sounds. No wheezing.  Abdominal:     General: Bowel sounds are normal. There is no distension.     Palpations: Abdomen is soft.     Tenderness: There is no abdominal tenderness.  Musculoskeletal:        General: Normal range of motion.     Cervical back: Normal range of motion.  Skin:    General: Skin is warm and dry.     Findings: No rash.  Neurological:     Mental Status: She is alert and oriented to person, place, and time.  Psychiatric:        Judgment: Judgment normal.     LAB RESULTS:  Lab Results  Component Value Date   NA 140 10/06/2020   K 4.6 10/06/2020   CL 104 10/06/2020   CO2 22 10/06/2020   GLUCOSE 82 10/06/2020   BUN 18 10/06/2020   CREATININE 0.65 10/06/2020   CALCIUM 9.3 10/06/2020   PROT 7.1 10/06/2020   ALBUMIN 4.3 10/06/2020   AST 17 10/06/2020   ALT 17 10/06/2020   ALKPHOS 71 10/06/2020   BILITOT <0.2 10/06/2020   GFRNONAA 110 10/21/2019   GFRAA 126 10/21/2019    Lab Results  Component Value Date   WBC 7.1 01/21/2021   NEUTROABS 3.9 01/21/2021   HGB 12.7 01/21/2021   HCT 38.0 01/21/2021   MCV 97.9 01/21/2021   PLT 268 01/21/2021   Lab Results  Component Value Date   IRON 172 (H) 01/21/2021   TIBC 206 (L) 01/21/2021   IRONPCTSAT 84 (H) 01/21/2021   Lab Results  Component Value Date   FERRITIN 32 01/21/2021     STUDIES: No results found.  ASSESSMENT: Hereditary hemochromatosis.   PLAN:    1. 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  In the past she has had a difficult time with phlebotomies and was started on Exjade for iron chelation.  She was previously taking two 50 mg daily from December 2014 through September 2020.  She  has since stopped and iron levels have essentially stabilized.  AFP is normal and unchanged at 2.2.  Abdominal ultrasound from 06/20/2018 did not reveal any suspicious abnormalities in her liver.  She receives phlebotomies approximately every 6 months which seems to be maintaining her.  Reviewed labs from 01/21/2021 which show iron saturations 84%, ferritin of 32 and total iron 172.  Hemoglobin 12.7.  Proceed with phlebotomy.  2.  Port maintenance:  Continue  every 6 to 8-week port flushes.    Disposition- Proceed with phlebotomy today.  RTC in 6 months for repeat lab work (CBC, iron, ferritin, AFP and CMP), assessment and possible phlebotomy.  Return to clinic every 6 to 8 weeks for port flush.  I spent 15 minutes dedicated to the care of this patient (face-to-face and non-face-to-face) on the date of the encounter to include what is described in the assessment and plan.   Patient expressed understanding and was in agreement with this plan. She also understands that She can call clinic at any time with any questions, concerns, or complaints.    Jacquelin Hawking, NP   01/22/2021 7:52 AM

## 2021-01-22 NOTE — Patient Instructions (Signed)

## 2021-01-22 NOTE — Progress Notes (Signed)
Therapeutic phlebotomy performed. 300 ml of blood removed via port access. Pt tolerated well. Accepted a beverage. Denies any weakness. Discharged to home.

## 2021-01-25 ENCOUNTER — Other Ambulatory Visit: Payer: Self-pay | Admitting: Physician Assistant

## 2021-01-25 DIAGNOSIS — Z1231 Encounter for screening mammogram for malignant neoplasm of breast: Secondary | ICD-10-CM

## 2021-02-10 ENCOUNTER — Ambulatory Visit
Admission: RE | Admit: 2021-02-10 | Discharge: 2021-02-10 | Disposition: A | Discharge status: Home / Self Care | Source: Ambulatory Visit | Attending: Physician Assistant | Admitting: Physician Assistant

## 2021-02-10 ENCOUNTER — Other Ambulatory Visit: Payer: Self-pay

## 2021-02-10 DIAGNOSIS — Z1231 Encounter for screening mammogram for malignant neoplasm of breast: Secondary | ICD-10-CM | POA: Diagnosis not present

## 2021-03-15 ENCOUNTER — Encounter: Payer: Self-pay | Admitting: Physician Assistant

## 2021-03-15 ENCOUNTER — Ambulatory Visit (INDEPENDENT_AMBULATORY_CARE_PROVIDER_SITE_OTHER): Payer: PRIVATE HEALTH INSURANCE | Admitting: Physician Assistant

## 2021-03-15 ENCOUNTER — Other Ambulatory Visit: Payer: Self-pay

## 2021-03-15 VITALS — BP 140/72 | HR 67 | Temp 98.0°F | Resp 16 | Ht <= 58 in | Wt 113.0 lb

## 2021-03-15 DIAGNOSIS — I10 Essential (primary) hypertension: Secondary | ICD-10-CM | POA: Diagnosis not present

## 2021-03-15 DIAGNOSIS — G4733 Obstructive sleep apnea (adult) (pediatric): Secondary | ICD-10-CM

## 2021-03-15 DIAGNOSIS — E039 Hypothyroidism, unspecified: Secondary | ICD-10-CM

## 2021-03-15 DIAGNOSIS — Z7189 Other specified counseling: Secondary | ICD-10-CM | POA: Diagnosis not present

## 2021-03-15 NOTE — Progress Notes (Signed)
[facility] [place], [place] [address]  Pulmonary Sleep Medicine   Office Visit Note  Patient Name: Christina Stephens DOB: 10-Nov-1975 MRN [id number]  Date of Service: 03/17/2021  Complaints/HPI: Pt is here for routine pulmonary follow up for OSA. She has her appt with Claiborne Billings in a few weeks, therefore no updated download to review today. Denies any headaches or dryness. Reports excellent compliance and is benefiting from use. Prior download from May 2022 does show 100% compliance, however her AHI was elevated at 12.1/hr. She thinks kelly may have adjusted something after this download last time, but is unclear. She will follow up with Claiborne Billings regarding AHI concerns when she sees her in a few weeks--may need pressure adjustment.  ROS  General: (-) fever, (-) chills, (-) night sweats, (-) weakness Skin: (-) rashes, (-) itching,. Eyes: (-) visual changes, (-) redness, (-) itching. Nose and Sinuses: (-) nasal stuffiness or itchiness, (-) postnasal drip, (-) nosebleeds, (-) sinus trouble. Mouth and Throat: (-) sore throat, (-) hoarseness. Neck: (-) swollen glands, (-) enlarged thyroid, (-) neck pain. Respiratory: - cough, (-) bloody sputum, - shortness of breath, - wheezing. Cardiovascular: - ankle swelling, (-) chest pain. Lymphatic: (-) lymph node enlargement. Neurologic: (-) numbness, (-) tingling. Psychiatric: (-) anxiety, (-) depression   Current Medication: Outpatient Encounter Medications as of 03/15/2021  Medication Sig Note   Calcium Carbonate-Vitamin D 600-200 MG-UNIT TABS Take by mouth.    CALCIUM-VITAMIN D PO Take 1 tablet by mouth daily.    levothyroxine (SYNTHROID) 50 MCG tablet Take 1 tablet (50 mcg total) by mouth daily.    metoprolol tartrate (LOPRESSOR) 25 MG tablet TAKE 1 TABLET BY MOUTH TWICE A DAY    phenytoin (DILANTIN) 100 MG ER capsule TAKE 2 CAPSULES (200 MG TOTAL) BY MOUTH 2 (TWO) TIMES DAILY. 01/02/2015: Received from: External Pharmacy    [DISCONTINUED] polyethylene glycol (MIRALAX / GLYCOLAX) packet 1 POWDER PACK IN 12 OZ WATER DAILY FOR CONSTIPATION (Patient not taking: No sig reported)    Facility-Administered Encounter Medications as of 03/15/2021  Medication   sodium chloride flush (NS) 0.9 % injection 10 mL   sodium chloride flush (NS) 0.9 % injection 10 mL   sodium chloride flush (NS) 0.9 % injection 10 mL    Surgical History: Past Surgical History:  Procedure Laterality Date   REDUCTION MAMMAPLASTY Bilateral 1993   VP STUNT HEAD SURGERYOther]  2000    Medical History: Past Medical History:  Diagnosis Date   CPAP (continuous positive airway pressure) dependence    Hereditary hemochromatosis (HCC)    Hypertension    Seizures ([place])    Sleep apnea    Spina bifida ([place])    Thyroid disease    hypothyroidism    Family History: Family History  Problem Relation Age of Onset   Hypertension Father    CAD Father    Diabetes Sister    Arthritis Sister    Breast cancer Maternal Grandmother    CAD Paternal Grandfather    Ovarian cancer Maternal Aunt     Social History: Social History   Socioeconomic History   Marital status: Single    Spouse name: Not on file   Number of children: Not on file   Years of education: Not on file   Highest education level: Not on file  Occupational History   Not on file  Tobacco Use   Smoking status: Never   Smokeless tobacco: Never  Substance and Sexual Activity   Alcohol use: No  Drug use: No   Sexual activity: Not on file  Other Topics Concern   Not on file  Social History Narrative   Not on file   Social Determinants of Health   Financial Resource Strain: Not on file  Food Insecurity: Not on file  Transportation Needs: Not on file  Physical Activity: Not on file  Stress: Not on file  Social Connections: Not on file  Intimate Partner Violence: Not on file    Vital Signs: Blood pressure 140/72, pulse 67, temperature 98 F (36.7 C), resp. rate 16,  height 4' 7"  (1.397 m), weight 113 lb (51.3 kg), SpO2 96 %.  Examination: General Appearance: The patient is well-developed, well-nourished, and in no distress. Skin: Gross inspection of skin unremarkable. Head: normocephalic, no gross deformities. Eyes: no gross deformities noted. ENT: ears appear grossly normal no exudates. Neck: Supple. No thyromegaly. No LAD. Respiratory: Lungs clear to auscultation bilaterally. Cardiovascular: Normal S1 and S2 without murmur or rub. Extremities: No cyanosis. pulses are equal. Neurologic: Alert and oriented. No involuntary movements.  LABS: Recent Results (from the past 2160 hour(s))  Iron and TIBC     Status: Abnormal   Collection Time: 01/21/21 11:28 AM  Result Value Ref Range   Iron 172 (H) 28 - 170 ug/dL   TIBC 206 (L) 250 - 450 ug/dL   Saturation Ratios 84 (H) 10.4 - 31.8 %   UIBC 34 ug/dL    Comment: Performed at [facility], [address] [place], [place] [address]  Ferritin     Status: None   Collection Time: 01/21/21 11:28 AM  Result Value Ref Range   Ferritin 32 11 - 307 ng/mL    Comment: Performed at [facility], [address] [place], [place] [address]  CBC with Differential/Platelet     Status: None   Collection Time: 01/21/21 11:28 AM  Result Value Ref Range   WBC 7.1 4.0 - 10.5 K/uL   RBC 3.88 3.87 - 5.11 MIL/uL   Hemoglobin 12.7 12.0 - 15.0 g/dL   HCT 38.0 36.0 - 46.0 %   MCV 97.9 80.0 - 100.0 fL   MCH 32.7 26.0 - 34.0 pg   MCHC 33.4 30.0 - 36.0 g/dL   RDW 12.2 11.5 - 15.5 %   Platelets 268 150 - 400 K/uL   nRBC 0.0 0.0 - 0.2 %   Neutrophils Relative % 55 %   Neutro Abs 3.9 1.7 - 7.7 K/uL   Lymphocytes Relative 32 %   Lymphs Abs 2.3 0.7 - 4.0 K/uL   Monocytes Relative 9 %   Monocytes Absolute 0.7 0.1 - 1.0 K/uL   Eosinophils Relative 3 %   Eosinophils Absolute 0.2 0.0 - 0.5 K/uL   Basophils Relative 1 %   Basophils Absolute 0.1 0.0 - 0.1 K/uL   Immature Granulocytes 0 %   Abs Immature  Granulocytes 0.02 0.00 - 0.07 K/uL    Comment: Performed at [facility], [address], [place],  [address]    Radiology: MM 3D SCREEN BREAST BILATERAL  Result Date: 02/13/2021 CLINICAL DATA:  Screening. EXAM: DIGITAL SCREENING BILATERAL MAMMOGRAM WITH TOMOSYNTHESIS AND CAD TECHNIQUE: Bilateral screening digital craniocaudal and mediolateral oblique mammograms were obtained. Bilateral screening digital breast tomosynthesis was performed. The images were evaluated with computer-aided detection. COMPARISON:  Previous exam(s). ACR Breast Density Category b: There are scattered areas of fibroglandular density. FINDINGS: There are no findings suspicious for malignancy. IMPRESSION: No mammographic evidence of malignancy. A result letter of  this screening mammogram will be mailed directly to the patient. RECOMMENDATION: Screening mammogram in one year. (Code:SM-B-01Y) BI-RADS CATEGORY  1: Negative. Electronically Signed   By: Dorise Bullion III M.D.   On: 02/13/2021 13:18   No results found.  No results found.    Assessment and Plan: Patient Active Problem List   Diagnosis Date Noted   Encounter for general adult medical examination with abnormal findings 09/25/2019   Encounter for screening mammogram for malignant neoplasm of breast 09/25/2019   Dysuria 10/01/2018   Acquired hypothyroidism 06/12/2018   Routine cervical smear 06/12/2018   Needs flu shot 02/04/2018   Essential hypertension 05/31/2017   Sleep apnea 05/31/2017   Spina bifida ([place]) 05/31/2017   Hemochromatosis 11/28/2014   Word finding difficulty 02/28/2013   Seizure disorder ([place]) 02/04/2013   Neurogenic bladder 02/04/2013   Left ankle sprain 09/29/2011   Left leg pain 09/29/2011    1. Obstructive sleep apnea Continue excellent compliance and f/u with Claiborne Billings regarding pressure and AHI. May need further adjustment of settings pending download.  2. CPAP use counseling CPAP couseling-Discussed importance of  adequate CPAP use as well as proper care and cleaning techniques of machine and all supplies.  3. Essential hypertension Controlled, continue current medication and f/u with PCP.  4. Acquired hypothyroidism Continue current medication and f/u with PCP.    General Counseling: I have discussed the findings of the evaluation and examination with [facility].  I have also discussed any further diagnostic evaluation thatmay be needed or ordered today. Juliahna verbalizes understanding of the findings of todays visit. We also reviewed her medications today and discussed drug interactions and side effects including but not limited excessive drowsiness and altered mental states. We also discussed that there is always a risk not just to her but also people around her. she has been encouraged to call the office with any questions or concerns that should arise related to todays visit.  No orders of the defined types were placed in this encounter.    Time spent: 30  I have personally obtained a history, examined the patient, evaluated laboratory and imaging results, formulated the assessment and plan and placed orders. This patient was seen by Drema Dallas, PA-C in collaboration with Dr. Devona Konig as a part of collaborative care agreement.     Allyne Gee, MD [facility] Pulmonary and Critical Care Sleep medicine

## 2021-03-17 NOTE — Patient Instructions (Signed)

## 2021-03-24 ENCOUNTER — Ambulatory Visit (INDEPENDENT_AMBULATORY_CARE_PROVIDER_SITE_OTHER): Payer: PRIVATE HEALTH INSURANCE

## 2021-03-24 ENCOUNTER — Other Ambulatory Visit: Payer: Self-pay

## 2021-03-24 DIAGNOSIS — G4733 Obstructive sleep apnea (adult) (pediatric): Secondary | ICD-10-CM | POA: Diagnosis not present

## 2021-03-24 NOTE — Progress Notes (Signed)
95 percentile pressure 12.1   95th percentile leak 16.0   apnea-hypopnea index  13.5 /hr   total days used  >4 hr 89 days  total days used <4 hr 1 days  Total compliance 98.9 percent  No problems or questions at this time Pt was seen by Claiborne Billings RRT/RCP from [facility]

## 2021-03-26 ENCOUNTER — Other Ambulatory Visit: Payer: Self-pay

## 2021-03-26 ENCOUNTER — Inpatient Hospital Stay: Attending: Oncology

## 2021-03-26 DIAGNOSIS — Z95828 Presence of other vascular implants and grafts: Secondary | ICD-10-CM

## 2021-03-26 DIAGNOSIS — Z452 Encounter for adjustment and management of vascular access device: Secondary | ICD-10-CM | POA: Diagnosis not present

## 2021-03-26 MED ORDER — HEPARIN SOD (PORK) LOCK FLUSH 100 UNIT/ML IV SOLN
500.0000 [IU] | Freq: Once | INTRAVENOUS | Status: AC
  Administered 2021-03-26: 500 [IU] via INTRAVENOUS
  Filled 2021-03-26: qty 5

## 2021-03-26 MED ORDER — SODIUM CHLORIDE 0.9% FLUSH
10.0000 mL | Freq: Once | INTRAVENOUS | Status: AC
  Administered 2021-03-26: 10 mL via INTRAVENOUS
  Filled 2021-03-26: qty 10

## 2021-05-28 ENCOUNTER — Inpatient Hospital Stay: Attending: Oncology

## 2021-05-28 ENCOUNTER — Other Ambulatory Visit: Payer: Self-pay

## 2021-05-28 DIAGNOSIS — Z452 Encounter for adjustment and management of vascular access device: Secondary | ICD-10-CM | POA: Diagnosis present

## 2021-05-28 DIAGNOSIS — Z95828 Presence of other vascular implants and grafts: Secondary | ICD-10-CM

## 2021-05-28 MED ORDER — HEPARIN SOD (PORK) LOCK FLUSH 100 UNIT/ML IV SOLN
500.0000 [IU] | Freq: Once | INTRAVENOUS | Status: AC
Start: 2021-05-28 — End: 2021-05-28
  Administered 2021-05-28: 500 [IU] via INTRAVENOUS
  Filled 2021-05-28: qty 5

## 2021-05-28 MED ORDER — SODIUM CHLORIDE 0.9% FLUSH
10.0000 mL | Freq: Once | INTRAVENOUS | Status: AC
  Administered 2021-05-28: 10 mL via INTRAVENOUS
  Filled 2021-05-28: qty 10

## 2021-07-13 ENCOUNTER — Other Ambulatory Visit: Payer: Self-pay | Admitting: *Deleted

## 2021-07-23 ENCOUNTER — Inpatient Hospital Stay: Attending: Oncology

## 2021-07-23 ENCOUNTER — Encounter: Payer: Self-pay | Admitting: Nurse Practitioner

## 2021-07-23 ENCOUNTER — Inpatient Hospital Stay (HOSPITAL_BASED_OUTPATIENT_CLINIC_OR_DEPARTMENT_OTHER): Admitting: Nurse Practitioner

## 2021-07-23 ENCOUNTER — Other Ambulatory Visit: Payer: Self-pay

## 2021-07-23 ENCOUNTER — Inpatient Hospital Stay

## 2021-07-23 DIAGNOSIS — Z79899 Other long term (current) drug therapy: Secondary | ICD-10-CM | POA: Insufficient documentation

## 2021-07-23 DIAGNOSIS — Z95828 Presence of other vascular implants and grafts: Secondary | ICD-10-CM | POA: Diagnosis not present

## 2021-07-23 DIAGNOSIS — G4730 Sleep apnea, unspecified: Secondary | ICD-10-CM | POA: Diagnosis not present

## 2021-07-23 DIAGNOSIS — I10 Essential (primary) hypertension: Secondary | ICD-10-CM | POA: Diagnosis not present

## 2021-07-23 DIAGNOSIS — E039 Hypothyroidism, unspecified: Secondary | ICD-10-CM | POA: Insufficient documentation

## 2021-07-23 DIAGNOSIS — G40909 Epilepsy, unspecified, not intractable, without status epilepticus: Secondary | ICD-10-CM | POA: Insufficient documentation

## 2021-07-23 DIAGNOSIS — Q059 Spina bifida, unspecified: Secondary | ICD-10-CM | POA: Insufficient documentation

## 2021-07-23 LAB — COMPREHENSIVE METABOLIC PANEL
ALT: 21 U/L (ref 0–44)
AST: 24 U/L (ref 15–41)
Albumin: 4 g/dL (ref 3.5–5.0)
Alkaline Phosphatase: 61 U/L (ref 38–126)
Anion gap: 7 (ref 5–15)
BUN: 18 mg/dL (ref 6–20)
CO2: 30 mmol/L (ref 22–32)
Calcium: 8.7 mg/dL — ABNORMAL LOW (ref 8.9–10.3)
Chloride: 98 mmol/L (ref 98–111)
Creatinine, Ser: 0.51 mg/dL (ref 0.44–1.00)
GFR, Estimated: >60 mL/min (ref >60)
Glucose, Bld: 102 mg/dL — ABNORMAL HIGH (ref 70–99)
Potassium: 3.9 mmol/L (ref 3.5–5.1)
Sodium: 135 mmol/L (ref 135–145)
Total Bilirubin: 0.2 mg/dL — ABNORMAL LOW (ref 0.3–1.2)
Total Protein: 7.3 g/dL (ref 6.5–8.1)

## 2021-07-23 LAB — CBC WITH DIFFERENTIAL/PLATELET
Abs Immature Granulocytes: 0.01 10*3/uL (ref 0.00–0.07)
Basophils Absolute: 0 10*3/uL (ref 0.0–0.1)
Basophils Relative: 1 %
Eosinophils Absolute: 0.1 10*3/uL (ref 0.0–0.5)
Eosinophils Relative: 2 %
HCT: 34.6 % — ABNORMAL LOW (ref 36.0–46.0)
Hemoglobin: 12.1 g/dL (ref 12.0–15.0)
Immature Granulocytes: 0 %
Lymphocytes Relative: 36 %
Lymphs Abs: 2.2 10*3/uL (ref 0.7–4.0)
MCH: 33.9 pg (ref 26.0–34.0)
MCHC: 35 g/dL (ref 30.0–36.0)
MCV: 96.9 fL (ref 80.0–100.0)
Monocytes Absolute: 0.6 10*3/uL (ref 0.1–1.0)
Monocytes Relative: 10 %
Neutro Abs: 3 10*3/uL (ref 1.7–7.7)
Neutrophils Relative %: 51 %
Platelets: 233 10*3/uL (ref 150–400)
RBC: 3.57 MIL/uL — ABNORMAL LOW (ref 3.87–5.11)
RDW: 12 % (ref 11.5–15.5)
WBC: 6 10*3/uL (ref 4.0–10.5)
nRBC: 0 % (ref 0.0–0.2)

## 2021-07-23 LAB — IRON AND TIBC
Iron: 181 ug/dL — ABNORMAL HIGH (ref 28–170)
Saturation Ratios: 94 % — ABNORMAL HIGH (ref 10.4–31.8)
TIBC: 193 ug/dL — ABNORMAL LOW (ref 250–450)
UIBC: 12 ug/dL

## 2021-07-23 LAB — FERRITIN: Ferritin: 55 ng/mL (ref 11–307)

## 2021-07-23 MED ORDER — HEPARIN SOD (PORK) LOCK FLUSH 100 UNIT/ML IV SOLN
500.0000 [IU] | Freq: Once | INTRAVENOUS | Status: AC
  Administered 2021-07-23: 500 [IU] via INTRAVENOUS
  Filled 2021-07-23: qty 5

## 2021-07-23 MED ORDER — SODIUM CHLORIDE 0.9% FLUSH
10.0000 mL | Freq: Once | INTRAVENOUS | Status: AC
  Administered 2021-07-23: 10 mL via INTRAVENOUS
  Filled 2021-07-23: qty 10

## 2021-07-23 NOTE — Patient Instructions (Signed)

## 2021-07-23 NOTE — Progress Notes (Signed)
?[place]  ?[phone] [phone] ? ?ID: [place] Senate OB: 1975/08/19  MR#: [id number]  JYN#:[id number] ? ?Patient Care Team: ?[name], MD as PCP - General (Internal Medicine) ?Lloyd Huger, MD as Consulting Physician (Oncology) ? ?CHIEF COMPLAINT: Hereditary hemochromatosis. ? ?History of present illness: Mrs. Ailey is a 46 year old female with past medical history significant for hypertension, sleep apnea, hypothyroidism, spina bifida, seizure disorder who is followed by Dr. Grayland Ormond for hereditary hemochromatosis.  She receives intermittent phlebotomy every 6 months. ? ?INTERVAL HISTORY: Patient is a 46 year old female with above history including homozygous for hemochromatosis who returns to clinic for labs, follow-up, and consideration of phlebotomy.  Overall she feels at baseline and denies specific complaints today.  Says that she tolerates phlebotomy well without significant side effects. ? ?REVIEW OF SYSTEMS:   ?Review of Systems  ?Constitutional:  Negative for chills, fever, malaise/fatigue and weight loss.  ?HENT:  Negative for hearing loss, nosebleeds, sore throat and tinnitus.   ?Eyes:  Negative for blurred vision and double vision.  ?Respiratory:  Negative for cough, hemoptysis, shortness of breath and wheezing.   ?Cardiovascular:  Negative for chest pain, palpitations and leg swelling.  ?Gastrointestinal:  Negative for abdominal pain, blood in stool, constipation, diarrhea, melena, nausea and vomiting.  ?Genitourinary:  Negative for dysuria and urgency.  ?Musculoskeletal:  Negative for back pain, falls, joint pain and myalgias.  ?Skin:  Negative for itching and rash.  ?Neurological:  Negative for dizziness, tingling, sensory change, loss of consciousness, weakness and headaches.  ?Endo/Heme/Allergies:  Negative for environmental allergies. Does not bruise/bleed easily.  ?Psychiatric/Behavioral:  Negative for depression. The patient is not  nervous/anxious and does not have insomnia.   ?As per HPI. Otherwise, a complete review of systems is negative. ? ?PAST MEDICAL HISTORY: ?Past Medical History:  ?Diagnosis Date  ? CPAP (continuous positive airway pressure) dependence   ? Hereditary hemochromatosis ([place])   ? Hypertension   ? Seizures ([place])   ? Sleep apnea   ? Spina bifida ([place])   ? Thyroid disease   ? hypothyroidism  ? ? ?PAST SURGICAL HISTORY: ?Past Surgical History:  ?Procedure Laterality Date  ? REDUCTION MAMMAPLASTY Bilateral 1993  ? VP STUNT HEAD SURGERYOther]  2000  ? ? ?FAMILY HISTORY: ?Family History  ?Problem Relation Age of Onset  ? Hypertension Father   ? CAD Father   ? Diabetes Sister   ? Arthritis Sister   ? Breast cancer Maternal Grandmother   ? CAD Paternal Grandfather   ? Ovarian cancer Maternal Aunt   ? ? ?ADVANCED DIRECTIVES (Y/N):  N ? ?HEALTH MAINTENANCE: ?Social History  ? ?Tobacco Use  ? Smoking status: Never  ? Smokeless tobacco: Never  ?Substance Use Topics  ? Alcohol use: No  ? Drug use: No  ? ? ? Colonoscopy: ? PAP: ? Bone density: ? Lipid panel: ? ?Allergies  ?Allergen Reactions  ? Latex   ?  Pt has Spina bifada and was told that if she has surgery she should avoid latex for poss. Reaction/infection.  ? ? ?Current Outpatient Medications  ?Medication Sig Dispense Refill  ? Calcium Carbonate-Vitamin D 600-200 MG-UNIT TABS Take by mouth.    ? CALCIUM-VITAMIN D PO Take 1 tablet by mouth daily.    ? levothyroxine (SYNTHROID) 50 MCG tablet Take 1 tablet (50 mcg total) by mouth daily. 90 tablet 3  ? metoprolol tartrate (LOPRESSOR) 25 MG tablet TAKE 1 TABLET BY MOUTH TWICE A DAY 180 tablet 1  ?  phenytoin (DILANTIN) 100 MG ER capsule TAKE 2 CAPSULES (200 MG TOTAL) BY MOUTH 2 (TWO) TIMES DAILY.  11  ? ?No current facility-administered medications for this visit.  ? ?Facility-Administered Medications Ordered in Other Visits  ?Medication Dose Route Frequency Provider Last Rate Last Admin  ? heparin lock flush 100 unit/mL  500 Units  Intravenous Once Verlon Au, NP      ? sodium chloride flush (NS) 0.9 % injection 10 mL  10 mL Intravenous PRN Cammie Sickle, MD   10 mL at 02/26/16 0917  ? sodium chloride flush (NS) 0.9 % injection 10 mL  10 mL Intravenous PRN Nolon Stalls C, MD   10 mL at 01/01/18 1108  ? sodium chloride flush (NS) 0.9 % injection 10 mL  10 mL Intravenous PRN Lloyd Huger, MD   10 mL at 04/22/20 1100  ? sodium chloride flush (NS) 0.9 % injection 10 mL  10 mL Intravenous Once Verlon Au, NP      ? ? ?OBJECTIVE: ?Vitals:  ? 07/23/21 1304  ?BP: 117/77  ?Pulse: 76  ?Temp: 98 ?F (36.7 ?C)  ? ?   There is no height or weight on file to calculate BMI.    ECOG FS:0 - Asymptomatic ? ?Physical Exam ?Constitutional:   ?   General: She is not in acute distress. ?   Appearance: She is well-developed. She is not ill-appearing.  ?Eyes:  ?   General: No scleral icterus. ?Cardiovascular:  ?   Rate and Rhythm: Normal rate and regular rhythm.  ?Pulmonary:  ?   Effort: Pulmonary effort is normal.  ?   Breath sounds: Normal breath sounds.  ?Abdominal:  ?   General: Abdomen is flat. There is no distension.  ?Musculoskeletal:  ?   Comments: Spina bifida. Braces.  ?Skin: ?   General: Skin is warm and dry.  ?Neurological:  ?   Mental Status: She is alert and oriented to person, place, and time.  ?Psychiatric:     ?   Mood and Affect: Mood normal.     ?   Behavior: Behavior normal.  ? ? ? ?LAB RESULTS: ? ?Lab Results  ?Component Value Date  ? NA 135 07/23/2021  ? K 3.9 07/23/2021  ? CL 98 07/23/2021  ? CO2 30 07/23/2021  ? GLUCOSE 102 (H) 07/23/2021  ? BUN 18 07/23/2021  ? CREATININE 0.51 07/23/2021  ? CALCIUM 8.7 (L) 07/23/2021  ? PROT 7.3 07/23/2021  ? ALBUMIN 4.0 07/23/2021  ? AST 24 07/23/2021  ? ALT 21 07/23/2021  ? ALKPHOS 61 07/23/2021  ? BILITOT 0.2 (L) 07/23/2021  ? GFRNONAA >60 07/23/2021  ? GFRAA 126 10/21/2019  ? ? ?Lab Results  ?Component Value Date  ? WBC 6.0 07/23/2021  ? NEUTROABS 3.0 07/23/2021  ? HGB 12.1  07/23/2021  ? HCT 34.6 (L) 07/23/2021  ? MCV 96.9 07/23/2021  ? PLT 233 07/23/2021  ? ?Lab Results  ?Component Value Date  ? IRON 172 (H) 01/21/2021  ? TIBC 206 (L) 01/21/2021  ? IRONPCTSAT 84 (H) 01/21/2021  ? ?Lab Results  ?Component Value Date  ? FERRITIN 32 01/21/2021  ? ? ? ?STUDIES: ?No results found. ? ?ASSESSMENT: Hereditary hemochromatosis.  ? ?PLAN:   ? ?1. 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 Goal ferritin < 50. Diagnosed in July 2011. Previously had side effects of phelbotomies and was on Exjade for iron chelation. Now off. Iron levels have stabilized. She receives phlebotomy every 6 months which has maintained  her levels. Ferritin pending at time of visit but hemoglobin is stable. Plan for phlebotomy today. Discussed that we can consider holding phlebotomy in the future unless ferritin is > 50 which she will consider.  ? ?2. Screening for [place]- AFP is normal and unchanged at 2.2.  Abdominal ultrasound from 06/20/2018 did not reveal any suspicious abnormalities in her liver.  AFP pending at time of visit.  ? ?2.  Port maintenance:  ?Continue every 6 to 8-week port flushes.   ? ?Disposition- ?Phlebotomy today ?6 weeks- port flush ?12 weeks- port/lab- cbc, ferritin ?18 weeks- port flush ?6 mo- port/lab- cbc, ferritin, AFP ?Day to week later, see [facility] Ganja +/- phlebotomy- la ? ?Patient expressed understanding and was in agreement with this plan. She also understands that She can call clinic at any time with any questions, concerns, or complaints.  ? ?Verlon Au, NP   07/23/2021  ? ? ? ? ?

## 2021-07-23 NOTE — Progress Notes (Signed)
300 ml of blood removed from [place]. Pt tolerated procedure well. VSS. Discharged to home. Drank a beverage. ?

## 2021-07-24 LAB — AFP TUMOR MARKER: AFP, Serum, Tumor Marker: 2.6 ng/mL (ref 0.0–6.4)

## 2021-08-10 ENCOUNTER — Other Ambulatory Visit: Payer: Self-pay | Admitting: Internal Medicine

## 2021-08-10 DIAGNOSIS — I10 Essential (primary) hypertension: Secondary | ICD-10-CM

## 2021-08-30 ENCOUNTER — Inpatient Hospital Stay: Attending: Oncology

## 2021-08-30 DIAGNOSIS — Z452 Encounter for adjustment and management of vascular access device: Secondary | ICD-10-CM | POA: Diagnosis present

## 2021-08-30 DIAGNOSIS — Z95828 Presence of other vascular implants and grafts: Secondary | ICD-10-CM

## 2021-08-30 MED ORDER — SODIUM CHLORIDE 0.9% FLUSH
10.0000 mL | Freq: Once | INTRAVENOUS | Status: AC
  Administered 2021-08-30: 10 mL via INTRAVENOUS
  Filled 2021-08-30: qty 10

## 2021-08-30 MED ORDER — HEPARIN SOD (PORK) LOCK FLUSH 100 UNIT/ML IV SOLN
500.0000 [IU] | Freq: Once | INTRAVENOUS | Status: AC
  Administered 2021-08-30: 500 [IU] via INTRAVENOUS
  Filled 2021-08-30: qty 5

## 2021-08-31 ENCOUNTER — Encounter

## 2021-09-03 ENCOUNTER — Encounter

## 2021-09-06 ENCOUNTER — Ambulatory Visit (INDEPENDENT_AMBULATORY_CARE_PROVIDER_SITE_OTHER): Payer: PRIVATE HEALTH INSURANCE | Admitting: Physician Assistant

## 2021-09-06 ENCOUNTER — Encounter: Payer: Self-pay | Admitting: Physician Assistant

## 2021-09-06 VITALS — BP 114/73 | HR 62 | Temp 97.8°F | Resp 16 | Ht <= 58 in | Wt 113.0 lb

## 2021-09-06 DIAGNOSIS — E039 Hypothyroidism, unspecified: Secondary | ICD-10-CM

## 2021-09-06 DIAGNOSIS — Z1211 Encounter for screening for malignant neoplasm of colon: Secondary | ICD-10-CM

## 2021-09-06 DIAGNOSIS — I10 Essential (primary) hypertension: Secondary | ICD-10-CM | POA: Diagnosis not present

## 2021-09-06 DIAGNOSIS — Z1212 Encounter for screening for malignant neoplasm of rectum: Secondary | ICD-10-CM

## 2021-09-06 DIAGNOSIS — E782 Mixed hyperlipidemia: Secondary | ICD-10-CM

## 2021-09-06 DIAGNOSIS — G40909 Epilepsy, unspecified, not intractable, without status epilepticus: Secondary | ICD-10-CM

## 2021-09-06 DIAGNOSIS — Z0001 Encounter for general adult medical examination with abnormal findings: Secondary | ICD-10-CM

## 2021-09-06 DIAGNOSIS — R300 Dysuria: Secondary | ICD-10-CM

## 2021-09-06 NOTE — Progress Notes (Signed)
?[facility] ?[address] ?[place], [place] [address] ? ?Internal MEDICINE  ?Office Visit Note ? ?Patient Name: Christina Stephens ? [id number]  ?[id number] ? ?Date of Service: 09/06/2021 ? ?Chief Complaint  ?Patient presents with  ? Annual Exam  ? Hypertension  ? ? ? ?HPI ?Pt is here for routine health maintenance examination ?-she is followed by neurology for seizures ?-She is followed by Heme/onc for hemachromatosis and has many labs monitored by them routinely ?-Bp stable, not really checked at home ?-Good appetite ?-Does puzzles on her tablet ?-mammogram done in October ?-Has eye doctor appt in July ?-Had an aunt who had cancer, but unsure of what kind. She is going to confirm no hx of colon cancer in order to move forward with cologuard over colonoscopy after speaking with her mom ?-thinks she may have had tdap a few years ago after a fall and will check on this ?-due to recheck thyroid and cholesterol, other done by hematology ? ?Current Medication: ?Outpatient Encounter Medications as of 09/06/2021  ?Medication Sig Note  ? Calcium Carbonate-Vitamin D 600-200 MG-UNIT TABS Take by mouth.   ? CALCIUM-VITAMIN D PO Take 1 tablet by mouth daily.   ? levothyroxine (SYNTHROID) 50 MCG tablet Take 1 tablet (50 mcg total) by mouth daily.   ? metoprolol tartrate (LOPRESSOR) 25 MG tablet TAKE 1 TABLET BY MOUTH TWICE A DAY   ? phenytoin (DILANTIN) 100 MG ER capsule TAKE 2 CAPSULES (200 MG TOTAL) BY MOUTH 2 (TWO) TIMES DAILY. 01/02/2015: Received from: External Pharmacy  ? ?Facility-Administered Encounter Medications as of 09/06/2021  ?Medication  ? sodium chloride flush (NS) 0.9 % injection 10 mL  ? sodium chloride flush (NS) 0.9 % injection 10 mL  ? sodium chloride flush (NS) 0.9 % injection 10 mL  ? ? ?Surgical History: ?Past Surgical History:  ?Procedure Laterality Date  ? REDUCTION MAMMAPLASTY Bilateral 1993  ? VP STUNT HEAD SURGERYOther]  2000  ? ? ?Medical History: ?Past Medical History:  ?Diagnosis Date  ? CPAP  (continuous positive airway pressure) dependence   ? Hereditary hemochromatosis ([place])   ? Hypertension   ? Seizures ([place])   ? Sleep apnea   ? Spina bifida ([place])   ? Thyroid disease   ? hypothyroidism  ? ? ?Family History: ?Family History  ?Problem Relation Age of Onset  ? Hypertension Father   ? CAD Father   ? Diabetes Sister   ? Arthritis Sister   ? Breast cancer Maternal Grandmother   ? CAD Paternal Grandfather   ? Ovarian cancer Maternal Aunt   ? ? ? ? ?Review of Systems  ?Constitutional:  Negative for chills, diaphoresis and fatigue.  ?HENT:  Negative for ear pain, postnasal drip and sinus pressure.   ?Eyes:  Negative for photophobia, discharge, redness, itching and visual disturbance.  ?Respiratory:  Negative for cough, shortness of breath and wheezing.   ?Cardiovascular:  Negative for chest pain, palpitations and leg swelling.  ?Gastrointestinal:  Negative for abdominal pain, constipation, diarrhea, nausea and vomiting.  ?Genitourinary:  Negative for dysuria and flank pain.  ?Musculoskeletal:  Negative for arthralgias, back pain, gait problem and neck pain.  ?Skin:  Negative for color change.  ?Allergic/Immunologic: Negative for environmental allergies and food allergies.  ?Neurological:  Negative for dizziness and headaches.  ?Hematological:  Does not bruise/bleed easily.  ?Psychiatric/Behavioral:  Negative for agitation, behavioral problems (depression) and hallucinations.   ? ? ?Vital Signs: ?BP 114/73   Pulse 62   Temp 97.8 ?F (36.6 ?  C)   Resp 16   Ht 4' 7"  (1.397 m)   Wt 113 lb (51.3 kg)   SpO2 97%   BMI 26.26 kg/m?  ? ? ?Physical Exam ?Vitals and nursing note reviewed.  ?Constitutional:   ?   General: She is not in acute distress. ?   Appearance: She is well-developed. She is not diaphoretic.  ?HENT:  ?   Head: Normocephalic and atraumatic.  ?   Mouth/Throat:  ?   Pharynx: No oropharyngeal exudate.  ?Eyes:  ?   Pupils: Pupils are equal, round, and reactive to light.  ?Neck:  ?   Thyroid: No  thyromegaly.  ?   Vascular: No JVD.  ?   Trachea: No tracheal deviation.  ?Cardiovascular:  ?   Rate and Rhythm: Normal rate and regular rhythm.  ?   Heart sounds: Normal heart sounds. No murmur heard. ?  No friction rub. No gallop.  ?Pulmonary:  ?   Effort: Pulmonary effort is normal. No respiratory distress.  ?   Breath sounds: No wheezing or rales.  ?Chest:  ?   Chest wall: No tenderness.  ?Abdominal:  ?   General: Bowel sounds are normal.  ?   Palpations: Abdomen is soft.  ?   Tenderness: There is no abdominal tenderness.  ?Musculoskeletal:     ?   General: Normal range of motion.  ?   Cervical back: Normal range of motion and neck supple.  ?Lymphadenopathy:  ?   Cervical: No cervical adenopathy.  ?Skin: ?   General: Skin is warm and dry.  ?Neurological:  ?   Mental Status: She is alert and oriented to person, place, and time.  ?   Cranial Nerves: No cranial nerve deficit.  ?Psychiatric:     ?   Behavior: Behavior normal.     ?   Thought Content: Thought content normal.     ?   Judgment: Judgment normal.  ? ? ? ?LABS: ?Recent Results (from the past 2160 hour(s))  ?Iron and TIBC(Labcorp/Sunquest)     Status: Abnormal  ? Collection Time: 07/23/21  1:01 PM  ?Result Value Ref Range  ? Iron 181 (H) 28 - 170 ug/dL  ? TIBC 193 (L) 250 - 450 ug/dL  ? Saturation Ratios 94 (H) 10.4 - 31.8 %  ? UIBC 12 ug/dL  ?  Comment: Performed at [facility] [address], [place], [place] [address]  ?AFP tumor marker     Status: None  ? Collection Time: 07/23/21  1:01 PM  ?Result Value Ref Range  ? AFP, Serum, Tumor Marker 2.6 0.0 - 6.4 ng/mL  ?  Comment: (NOTE) ?Roche Diagnostics Electrochemiluminescence Immunoassay [facility]) ?Values obtained with different assay methods or kits cannot be ?used interchangeably.  Results cannot be interpreted as absolute ?evidence of the presence or absence of malignant disease. ?This test is not interpretable in pregnant females. ?Performed At: [facility] ?[address]  [place], [place] [id number] ?Rush Farmer MD FM:[id number] ?  ?Ferritin     Status: None  ? Collection Time: 07/23/21  1:01 PM  ?Result Value Ref Range  ? Ferritin 55 11 - 307 ng/mL  ?  Comment: Performed at [facility], [address] [place], [place] [address]  ?Comprehensive metabolic panel     Status: Abnormal  ? Collection Time: 07/23/21  1:01 PM  ?Result Value Ref Range  ? Sodium 135 135 - 145 mmol/L  ? Potassium 3.9 3.5 - 5.1 mmol/L  ? Chloride  98 98 - 111 mmol/L  ? CO2 30 22 - 32 mmol/L  ? Glucose, Bld 102 (H) 70 - 99 mg/dL  ?  Comment: Glucose reference range applies only to samples taken after fasting for at least 8 hours.  ? BUN 18 6 - 20 mg/dL  ? Creatinine, Ser 0.51 0.44 - 1.00 mg/dL  ? Calcium 8.7 (L) 8.9 - 10.3 mg/dL  ? Total Protein 7.3 6.5 - 8.1 g/dL  ? Albumin 4.0 3.5 - 5.0 g/dL  ? AST 24 15 - 41 U/L  ? ALT 21 0 - 44 U/L  ? Alkaline Phosphatase 61 38 - 126 U/L  ? Total Bilirubin 0.2 (L) 0.3 - 1.2 mg/dL  ? GFR, Estimated >60 >60 mL/min  ?  Comment: (NOTE) ?Calculated using the CKD-EPI Creatinine Equation (2021) ?  ? Anion gap 7 5 - 15  ?  Comment: Performed at [facility], [address] [place], [place] [address]  ?CBC with Differential     Status: Abnormal  ? Collection Time: 07/23/21  1:01 PM  ?Result Value Ref Range  ? WBC 6.0 4.0 - 10.5 K/uL  ? RBC 3.57 (L) 3.87 - 5.11 MIL/uL  ? Hemoglobin 12.1 12.0 - 15.0 g/dL  ? HCT 34.6 (L) 36.0 - 46.0 %  ? MCV 96.9 80.0 - 100.0 fL  ? MCH 33.9 26.0 - 34.0 pg  ? MCHC 35.0 30.0 - 36.0 g/dL  ? RDW 12.0 11.5 - 15.5 %  ? Platelets 233 150 - 400 K/uL  ? nRBC 0.0 0.0 - 0.2 %  ? Neutrophils Relative % 51 %  ? Neutro Abs 3.0 1.7 - 7.7 K/uL  ? Lymphocytes Relative 36 %  ? Lymphs Abs 2.2 0.7 - 4.0 K/uL  ? Monocytes Relative 10 %  ? Monocytes Absolute 0.6 0.1 - 1.0 K/uL  ? Eosinophils Relative 2 %  ? Eosinophils Absolute 0.1 0.0 - 0.5 K/uL  ? Basophils Relative 1 %  ? Basophils Absolute 0.0 0.0 - 0.1 K/uL  ? Immature Granulocytes 0 %  ? Abs Immature  Granulocytes 0.01 0.00 - 0.07 K/uL  ?  Comment: Performed at [facility], [address], [place], [place] [address]  ? ? ? ? ? ? ?Assessment/Plan: ?1. Encounter for general adult medical examination with abnorm

## 2021-09-07 ENCOUNTER — Telehealth: Payer: Self-pay

## 2021-09-07 NOTE — Telephone Encounter (Signed)
Patient wants to do the cologuard  ?

## 2021-09-09 ENCOUNTER — Encounter: Payer: PRIVATE HEALTH INSURANCE | Admitting: Physician Assistant

## 2021-09-13 ENCOUNTER — Encounter: Payer: Self-pay | Admitting: Physician Assistant

## 2021-09-13 ENCOUNTER — Ambulatory Visit (INDEPENDENT_AMBULATORY_CARE_PROVIDER_SITE_OTHER): Payer: PRIVATE HEALTH INSURANCE | Admitting: Physician Assistant

## 2021-09-13 VITALS — BP 133/74 | HR 70 | Temp 97.8°F | Resp 16 | Ht <= 58 in | Wt 113.0 lb

## 2021-09-13 DIAGNOSIS — I10 Essential (primary) hypertension: Secondary | ICD-10-CM

## 2021-09-13 DIAGNOSIS — G4733 Obstructive sleep apnea (adult) (pediatric): Secondary | ICD-10-CM

## 2021-09-13 DIAGNOSIS — Z7189 Other specified counseling: Secondary | ICD-10-CM | POA: Diagnosis not present

## 2021-09-13 NOTE — Progress Notes (Signed)
[facility] [place], [place] [address]  Pulmonary Sleep Medicine   Office Visit Note  Patient Name: Christina Stephens DOB: Feb 07, 1976 MRN [id number]  Date of Service: 09/13/2021  Complaints/HPI: Pt is here for routine pulmonary follow up for OSA. She reports tolerating and benefiting from CPAP use. Feels well rested. Denies headaches, SOB, or dryness. Unfortunately her download is not available today--she has this scheduled for next week. Her AHI on last download was elevated and her pressure was adjusted and hopefully will have corrected this. If not may need further adjustment.   ROS  General: (-) fever, (-) chills, (-) night sweats, (-) weakness Skin: (-) rashes, (-) itching,. Eyes: (-) visual changes, (-) redness, (-) itching. Nose and Sinuses: (-) nasal stuffiness or itchiness, (-) postnasal drip, (-) nosebleeds, (-) sinus trouble. Mouth and Throat: (-) sore throat, (-) hoarseness. Neck: (-) swollen glands, (-) enlarged thyroid, (-) neck pain. Respiratory: - cough, (-) bloody sputum, - shortness of breath, - wheezing. Cardiovascular: - ankle swelling, (-) chest pain. Lymphatic: (-) lymph node enlargement. Neurologic: (-) numbness, (-) tingling. Psychiatric: (-) anxiety, (-) depression   Current Medication: Outpatient Encounter Medications as of 09/13/2021  Medication Sig Note   Calcium Carbonate-Vitamin D 600-200 MG-UNIT TABS Take by mouth.    CALCIUM-VITAMIN D PO Take 1 tablet by mouth daily.    levothyroxine (SYNTHROID) 50 MCG tablet Take 1 tablet (50 mcg total) by mouth daily.    metoprolol tartrate (LOPRESSOR) 25 MG tablet TAKE 1 TABLET BY MOUTH TWICE A DAY    phenytoin (DILANTIN) 100 MG ER capsule TAKE 2 CAPSULES (200 MG TOTAL) BY MOUTH 2 (TWO) TIMES DAILY. 01/02/2015: Received from: External Pharmacy   Facility-Administered Encounter Medications as of 09/13/2021  Medication   sodium chloride flush (NS) 0.9 % injection 10 mL   sodium chloride  flush (NS) 0.9 % injection 10 mL   sodium chloride flush (NS) 0.9 % injection 10 mL    Surgical History: Past Surgical History:  Procedure Laterality Date   REDUCTION MAMMAPLASTY Bilateral 1993   VP STUNT HEAD SURGERYOther]  2000    Medical History: Past Medical History:  Diagnosis Date   CPAP (continuous positive airway pressure) dependence    Hereditary hemochromatosis ([facility])    Hypertension    Seizures ([place])    Sleep apnea    Spina bifida ([place])    Thyroid disease    hypothyroidism    Family History: Family History  Problem Relation Age of Onset   Hypertension Father    CAD Father    Diabetes Sister    Arthritis Sister    Breast cancer Maternal Grandmother    CAD Paternal Grandfather    Ovarian cancer Maternal Aunt     Social History: Social History   Socioeconomic History   Marital status: Single    Spouse name: Not on file   Number of children: Not on file   Years of education: Not on file   Highest education level: Not on file  Occupational History   Not on file  Tobacco Use   Smoking status: Never   Smokeless tobacco: Never  Substance and Sexual Activity   Alcohol use: No   Drug use: No   Sexual activity: Not on file  Other Topics Concern   Not on file  Social History Narrative   Not on file   Social Determinants of Health   Financial Resource Strain: Not on file  Food Insecurity: Not on file  Transportation Needs:  Not on file  Physical Activity: Not on file  Stress: Not on file  Social Connections: Not on file  Intimate Partner Violence: Not on file    Vital Signs: Blood pressure 133/74, pulse 70, temperature 97.8 F (36.6 C), resp. rate 16, height 4' 7"  (1.397 m), weight 113 lb (51.3 kg), SpO2 97 %.  Examination: General Appearance: The patient is well-developed, well-nourished, and in no distress. Skin: Gross inspection of skin unremarkable. Head: normocephalic, no gross deformities. Eyes: no gross deformities noted. ENT: ears  appear grossly normal no exudates. Neck: Supple. No thyromegaly. No LAD. Respiratory: Lungs clear to auscultation bilaterally. Cardiovascular: Normal S1 and S2 without murmur or rub. Extremities: No cyanosis. pulses are equal. Neurologic: Alert and oriented. No involuntary movements.  LABS: Recent Results (from the past 2160 hour(s))  Iron and TIBC([facility]corp/Sunquest)     Status: Abnormal   Collection Time: 07/23/21  1:01 PM  Result Value Ref Range   Iron 181 (H) 28 - 170 ug/dL   TIBC 193 (L) 250 - 450 ug/dL   Saturation Ratios 94 (H) 10.4 - 31.8 %   UIBC 12 ug/dL    Comment: Performed at [facility], [address] [place], [place] [address]  AFP tumor marker     Status: None   Collection Time: 07/23/21  1:01 PM  Result Value Ref Range   AFP, Serum, Tumor Marker 2.6 0.0 - 6.4 ng/mL    Comment: (NOTE) Roche Diagnostics Electrochemiluminescence Immunoassay (ECLIA) Values obtained with different assay methods or kits cannot be used interchangeably.  Results cannot be interpreted as absolute evidence of the presence or absence of malignant disease. This test is not interpretable in pregnant females. Performed At: [facility] [place], [place] [id number] Rush Farmer MD UU:[id number]   Ferritin     Status: None   Collection Time: 07/23/21  1:01 PM  Result Value Ref Range   Ferritin 55 11 - 307 ng/mL    Comment: Performed at [facility], [address] [place], [place] [address]  Comprehensive metabolic panel     Status: Abnormal   Collection Time: 07/23/21  1:01 PM  Result Value Ref Range   Sodium 135 135 - 145 mmol/L   Potassium 3.9 3.5 - 5.1 mmol/L   Chloride 98 98 - 111 mmol/L   CO2 30 22 - 32 mmol/L   Glucose, Bld 102 (H) 70 - 99 mg/dL    Comment: Glucose reference range applies only to samples taken after fasting for at least 8 hours.   BUN 18 6 - 20 mg/dL   Creatinine, Ser 0.51 0.44 - 1.00 mg/dL   Calcium 8.7 (L) 8.9 -  10.3 mg/dL   Total Protein 7.3 6.5 - 8.1 g/dL   Albumin 4.0 3.5 - 5.0 g/dL   AST 24 15 - 41 U/L   ALT 21 0 - 44 U/L   Alkaline Phosphatase 61 38 - 126 U/L   Total Bilirubin 0.2 (L) 0.3 - 1.2 mg/dL   GFR, Estimated >60 >60 mL/min    Comment: (NOTE) Calculated using the CKD-EPI Creatinine Equation (2021)    Anion gap 7 5 - 15    Comment: Performed at [facility], [address] [place], [place] [address]  CBC with Differential     Status: Abnormal   Collection Time: 07/23/21  1:01 PM  Result Value Ref Range   WBC 6.0 4.0 - 10.5 K/uL   RBC 3.57 (L) 3.87 - 5.11 MIL/uL  Hemoglobin 12.1 12.0 - 15.0 g/dL   HCT 34.6 (L) 36.0 - 46.0 %   MCV 96.9 80.0 - 100.0 fL   MCH 33.9 26.0 - 34.0 pg   MCHC 35.0 30.0 - 36.0 g/dL   RDW 12.0 11.5 - 15.5 %   Platelets 233 150 - 400 K/uL   nRBC 0.0 0.0 - 0.2 %   Neutrophils Relative % 51 %   Neutro Abs 3.0 1.7 - 7.7 K/uL   Lymphocytes Relative 36 %   Lymphs Abs 2.2 0.7 - 4.0 K/uL   Monocytes Relative 10 %   Monocytes Absolute 0.6 0.1 - 1.0 K/uL   Eosinophils Relative 2 %   Eosinophils Absolute 0.1 0.0 - 0.5 K/uL   Basophils Relative 1 %   Basophils Absolute 0.0 0.0 - 0.1 K/uL   Immature Granulocytes 0 %   Abs Immature Granulocytes 0.01 0.00 - 0.07 K/uL    Comment: Performed at [facility], [address] [place], [place] [address]    Radiology: MM 3D SCREEN BREAST BILATERAL  Result Date: 02/13/2021 CLINICAL DATA:  Screening. EXAM: DIGITAL SCREENING BILATERAL MAMMOGRAM WITH TOMOSYNTHESIS AND CAD TECHNIQUE: Bilateral screening digital craniocaudal and mediolateral oblique mammograms were obtained. Bilateral screening digital breast tomosynthesis was performed. The images were evaluated with computer-aided detection. COMPARISON:  Previous exam(s). ACR Breast Density Category b: There are scattered areas of fibroglandular density. FINDINGS: There are no findings suspicious for malignancy. IMPRESSION: No mammographic evidence of  malignancy. A result letter of this screening mammogram will be mailed directly to the patient. RECOMMENDATION: Screening mammogram in one year. (Code:SM-B-01Y) BI-RADS CATEGORY  1: Negative. Electronically Signed   By: Dorise Bullion III M.D.   On: 02/13/2021 13:18   No results found.  No results found.    Assessment and Plan: Patient Active Problem List   Diagnosis Date Noted   Encounter for general adult medical examination with abnormal findings 09/25/2019   Encounter for screening mammogram for malignant neoplasm of breast 09/25/2019   Dysuria 10/01/2018   Acquired hypothyroidism 06/12/2018   Routine cervical smear 06/12/2018   Needs flu shot 02/04/2018   Essential hypertension 05/31/2017   Sleep apnea 05/31/2017   Spina bifida ([place]) 05/31/2017   Hemochromatosis 11/28/2014   Word finding difficulty 02/28/2013   Seizure disorder ([employer]) 02/04/2013   Neurogenic bladder 02/04/2013   Left ankle sprain 09/29/2011   Left leg pain 09/29/2011    1. Obstructive sleep apnea Continue nightly use, will have download next week  2. CPAP use counseling CPAP couseling-Discussed importance of adequate CPAP use as well as proper care and cleaning techniques of machine and all supplies.  3. Essential hypertension Well controlled, continue current medication   General Counseling: I have discussed the findings of the evaluation and examination with Sheppard Evens.  I have also discussed any further diagnostic evaluation thatmay be needed or ordered today. Wylene verbalizes understanding of the findings of todays visit. We also reviewed her medications today and discussed drug interactions and side effects including but not limited excessive drowsiness and altered mental states. We also discussed that there is always a risk not just to her but also people around her. she has been encouraged to call the office with any questions or concerns that should arise related to todays visit.  No orders of the  defined types were placed in this encounter.    Time spent: 30  I have personally obtained a history, examined the patient, evaluated laboratory and imaging results, formulated the assessment and  plan and placed orders. This patient was seen by Drema Dallas, PA-C in collaboration with Dr. Devona Konig as a part of collaborative care agreement.     Allyne Gee, MD [facility] Pulmonary and Critical Care Sleep medicine

## 2021-09-14 LAB — TSH+FREE T4
Free T4: 1.21 ng/dL (ref 0.82–1.77)
TSH: 0.444 u[IU]/mL — ABNORMAL LOW (ref 0.450–4.500)

## 2021-09-14 LAB — LIPID PANEL WITH LDL/HDL RATIO
Cholesterol, Total: 142 mg/dL (ref 100–199)
HDL: 65 mg/dL (ref >39)
LDL Chol Calc (NIH): 65 mg/dL (ref 0–99)
LDL/HDL Ratio: 1 ratio (ref 0.0–3.2)
Triglycerides: 54 mg/dL (ref 0–149)
VLDL Cholesterol Cal: 12 mg/dL (ref 5–40)

## 2021-09-17 ENCOUNTER — Telehealth: Payer: Self-pay

## 2021-09-17 NOTE — Telephone Encounter (Signed)
-----   Message from Mylinda Latina, PA-C sent at 09/16/2021  4:44 PM EDT ----- ?Please let her know that her labs looked good. Her TSH dropped some, but free T4 is still in appropriate range and will keep her synthroid the same ?

## 2021-09-17 NOTE — Telephone Encounter (Signed)
Spoke with patient regarding lab results on 09/17/2021. ?

## 2021-09-22 ENCOUNTER — Ambulatory Visit (INDEPENDENT_AMBULATORY_CARE_PROVIDER_SITE_OTHER): Payer: PRIVATE HEALTH INSURANCE

## 2021-09-22 DIAGNOSIS — G4733 Obstructive sleep apnea (adult) (pediatric): Secondary | ICD-10-CM

## 2021-09-22 NOTE — Progress Notes (Signed)
95 percentile pressure 13.4 ? ? 95th percentile leak 11.0 ? ?  ? apnea-hypopnea index  16.5 /hr ? ? total days used  >4 hr 90 days ? total days used <4 hr 0 days ? ?Total compliance 100 percent ? ?She is doing great wearing her AHI is still over 16 no other problems or questions at this time. ?Pt was seen by Claiborne Billings  RRT/RCP  from [facility] ?

## 2021-09-26 ENCOUNTER — Other Ambulatory Visit: Payer: Self-pay | Admitting: Internal Medicine

## 2021-09-26 DIAGNOSIS — E039 Hypothyroidism, unspecified: Secondary | ICD-10-CM

## 2021-09-30 LAB — COLOGUARD: COLOGUARD: POSITIVE — AB

## 2021-10-01 ENCOUNTER — Other Ambulatory Visit: Payer: Self-pay | Admitting: Physician Assistant

## 2021-10-01 ENCOUNTER — Telehealth: Payer: Self-pay

## 2021-10-01 DIAGNOSIS — R195 Other fecal abnormalities: Secondary | ICD-10-CM

## 2021-10-01 NOTE — Telephone Encounter (Signed)
Pt mother and pt advised that cologuard came back positive send referral for GI

## 2021-10-01 NOTE — Telephone Encounter (Signed)
-----   Message from Mylinda Latina, PA-C sent at 10/01/2021  1:26 PM EDT ----- Please let her know that her cologuard did come back positive which indicated that a colonoscopy needs to be performed to evaluate further and I will send a new referral to GI for this

## 2021-10-05 ENCOUNTER — Encounter: Payer: Self-pay | Admitting: Oncology

## 2021-10-05 ENCOUNTER — Other Ambulatory Visit: Payer: Self-pay

## 2021-10-05 DIAGNOSIS — R195 Other fecal abnormalities: Secondary | ICD-10-CM

## 2021-10-05 MED ORDER — NA SULFATE-K SULFATE-MG SULF 17.5-3.13-1.6 GM/177ML PO SOLN: 1.0000 | Freq: Once | ORAL | 0 refills | Status: AC

## 2021-10-05 NOTE — Progress Notes (Signed)
Gastroenterology Pre-Procedure Review  Request Date: 11/04/2021 Requesting Physician: Dr. Marius Ditch  PATIENT REVIEW QUESTIONS: The patient responded to the following health history questions as indicated:    1. Are you having any GI issues? no 2. Do you have a personal history of Polyps? no 3. Do you have a family history of Colon Cancer or Polyps? no 4. Diabetes Mellitus? no 5. Joint replacements in the past 12 months?no 6. Major health problems in the past 3 months?no 7. Any artificial heart valves, MVP, or defibrillator?no    MEDICATIONS & ALLERGIES:    Patient reports the following regarding taking any anticoagulation/antiplatelet therapy:   Plavix, Coumadin, Eliquis, Xarelto, Lovenox, Pradaxa, Brilinta, or Effient? no Aspirin? no  Patient confirms/reports the following medications:  Current Outpatient Medications  Medication Sig Dispense Refill   Calcium Carbonate-Vitamin D 600-200 MG-UNIT TABS Take by mouth.     CALCIUM-VITAMIN D PO Take 1 tablet by mouth daily.     levothyroxine (SYNTHROID) 50 MCG tablet TAKE 1 TABLET BY MOUTH EVERY DAY 90 tablet 1   metoprolol tartrate (LOPRESSOR) 25 MG tablet TAKE 1 TABLET BY MOUTH TWICE A DAY 180 tablet 1   phenytoin (DILANTIN) 100 MG ER capsule TAKE 2 CAPSULES (200 MG TOTAL) BY MOUTH 2 (TWO) TIMES DAILY.  11   No current facility-administered medications for this visit.   Facility-Administered Medications Ordered in Other Visits  Medication Dose Route Frequency Provider Last Rate Last Admin   sodium chloride flush (NS) 0.9 % injection 10 mL  10 mL Intravenous PRN Charlaine Dalton R, MD   10 mL at 02/26/16 0917   sodium chloride flush (NS) 0.9 % injection 10 mL  10 mL Intravenous PRN Nolon Stalls C, MD   10 mL at 01/01/18 1108   sodium chloride flush (NS) 0.9 % injection 10 mL  10 mL Intravenous PRN Lloyd Huger, MD   10 mL at 04/22/20 1100    Patient confirms/reports the following allergies:  Allergies  Allergen Reactions    Latex     Pt has Spina bifada and was told that if she has surgery she should avoid latex for poss. Reaction/infection.    No orders of the defined types were placed in this encounter.   AUTHORIZATION INFORMATION Primary Insurance: 1D#: Group #:  Secondary Insurance: 1D#: Group #:  SCHEDULE INFORMATION: Date: 11/04/2021 Time: Location:[facility]

## 2021-10-12 ENCOUNTER — Inpatient Hospital Stay: Attending: Oncology

## 2021-10-12 ENCOUNTER — Other Ambulatory Visit: Payer: Self-pay

## 2021-10-12 DIAGNOSIS — Z95828 Presence of other vascular implants and grafts: Secondary | ICD-10-CM

## 2021-10-12 LAB — CBC WITH DIFFERENTIAL/PLATELET
Abs Immature Granulocytes: 0 10*3/uL (ref 0.00–0.07)
Basophils Absolute: 0 10*3/uL (ref 0.0–0.1)
Basophils Relative: 1 %
Eosinophils Absolute: 0.2 10*3/uL (ref 0.0–0.5)
Eosinophils Relative: 3 %
HCT: 35.5 % — ABNORMAL LOW (ref 36.0–46.0)
Hemoglobin: 12.2 g/dL (ref 12.0–15.0)
Immature Granulocytes: 0 %
Lymphocytes Relative: 36 %
Lymphs Abs: 2.4 10*3/uL (ref 0.7–4.0)
MCH: 33.7 pg (ref 26.0–34.0)
MCHC: 34.4 g/dL (ref 30.0–36.0)
MCV: 98.1 fL (ref 80.0–100.0)
Monocytes Absolute: 0.6 10*3/uL (ref 0.1–1.0)
Monocytes Relative: 8 %
Neutro Abs: 3.5 10*3/uL (ref 1.7–7.7)
Neutrophils Relative %: 52 %
Platelets: 251 10*3/uL (ref 150–400)
RBC: 3.62 MIL/uL — ABNORMAL LOW (ref 3.87–5.11)
RDW: 12 % (ref 11.5–15.5)
WBC: 6.6 10*3/uL (ref 4.0–10.5)
nRBC: 0 % (ref 0.0–0.2)

## 2021-10-12 LAB — IRON AND TIBC
Iron: 173 ug/dL — ABNORMAL HIGH (ref 28–170)
Saturation Ratios: 90 % — ABNORMAL HIGH (ref 10.4–31.8)
TIBC: 193 ug/dL — ABNORMAL LOW (ref 250–450)
UIBC: 20 ug/dL

## 2021-10-12 LAB — FERRITIN: Ferritin: 40 ng/mL (ref 11–307)

## 2021-10-12 MED ORDER — HEPARIN SOD (PORK) LOCK FLUSH 100 UNIT/ML IV SOLN
500.0000 [IU] | Freq: Once | INTRAVENOUS | Status: AC
  Administered 2021-10-12: 500 [IU] via INTRAVENOUS
  Filled 2021-10-12: qty 5

## 2021-10-12 MED ORDER — SODIUM CHLORIDE 0.9% FLUSH
10.0000 mL | Freq: Once | INTRAVENOUS | Status: AC
  Administered 2021-10-12: 10 mL via INTRAVENOUS
  Filled 2021-10-12: qty 10

## 2021-10-15 ENCOUNTER — Other Ambulatory Visit

## 2021-10-15 ENCOUNTER — Encounter

## 2021-10-27 ENCOUNTER — Encounter: Payer: Self-pay | Admitting: Oncology

## 2021-11-04 ENCOUNTER — Ambulatory Visit: Admitting: Certified Registered"

## 2021-11-04 ENCOUNTER — Encounter: Payer: Self-pay | Admitting: Gastroenterology

## 2021-11-04 ENCOUNTER — Ambulatory Visit
Admission: RE | Admit: 2021-11-04 | Discharge: 2021-11-04 | Disposition: A | Discharge status: Home / Self Care | Attending: Gastroenterology | Admitting: Gastroenterology

## 2021-11-04 ENCOUNTER — Encounter
Admission: RE | Disposition: A | Discharge status: Home / Self Care | Payer: Self-pay | Source: Home / Self Care | Attending: Gastroenterology

## 2021-11-04 DIAGNOSIS — K635 Polyp of colon: Secondary | ICD-10-CM

## 2021-11-04 DIAGNOSIS — Z1211 Encounter for screening for malignant neoplasm of colon: Secondary | ICD-10-CM | POA: Diagnosis present

## 2021-11-04 DIAGNOSIS — R195 Other fecal abnormalities: Secondary | ICD-10-CM

## 2021-11-04 DIAGNOSIS — D120 Benign neoplasm of cecum: Secondary | ICD-10-CM | POA: Insufficient documentation

## 2021-11-04 HISTORY — DX: Hypothyroidism, unspecified: E03.9

## 2021-11-04 HISTORY — PX: COLONOSCOPY WITH PROPOFOL: SHX5780

## 2021-11-04 SURGERY — COLONOSCOPY WITH PROPOFOL
Anesthesia: General

## 2021-11-04 MED ORDER — DEXMEDETOMIDINE HCL IN NACL 200 MCG/50ML IV SOLN
INTRAVENOUS | Status: DC | PRN
  Administered 2021-11-04: 8 ug via INTRAVENOUS

## 2021-11-04 MED ORDER — LIDOCAINE HCL (CARDIAC) PF 100 MG/5ML IV SOSY
PREFILLED_SYRINGE | INTRAVENOUS | Status: DC | PRN
  Administered 2021-11-04: 100 mg via INTRAVENOUS

## 2021-11-04 MED ORDER — PROPOFOL 500 MG/50ML IV EMUL
INTRAVENOUS | Status: DC | PRN
  Administered 2021-11-04: 160 ug/kg/min via INTRAVENOUS

## 2021-11-04 MED ORDER — PHENYLEPHRINE 80 MCG/ML (10ML) SYRINGE FOR IV PUSH (FOR BLOOD PRESSURE SUPPORT)
PREFILLED_SYRINGE | INTRAVENOUS | Status: DC | PRN
  Administered 2021-11-04: 80 ug via INTRAVENOUS

## 2021-11-04 MED ORDER — SODIUM CHLORIDE 0.9 % IV SOLN: INTRAVENOUS | Status: DC

## 2021-11-04 MED ORDER — PROPOFOL 10 MG/ML IV BOLUS
INTRAVENOUS | Status: DC | PRN
  Administered 2021-11-04: 20 mg via INTRAVENOUS
  Administered 2021-11-04: 50 mg via INTRAVENOUS

## 2021-11-04 MED ORDER — PROPOFOL 1000 MG/100ML IV EMUL
INTRAVENOUS | Status: AC
  Filled 2021-11-04: qty 1000

## 2021-11-04 NOTE — H&P (Signed)
Cephas Darby, MD [address]  [place]  [place], [place] [address]  Main: [phone]  Fax: [phone] Pager: [phone]  Primary Care Physician:  Mylinda Latina, PA-C Primary Gastroenterologist:  Dr. Cephas Darby  Pre-Procedure History & Physical: HPI:  Christina Stephens is a 46 y.o. female is here for an colonoscopy.   Past Medical History:  Diagnosis Date   CPAP (continuous positive airway pressure) dependence    Hereditary hemochromatosis ([place])    Hypertension    Hypothyroidism    Seizures ([facility])    Sleep apnea    Spina bifida ([place])    Thyroid disease    hypothyroidism    Past Surgical History:  Procedure Laterality Date   REDUCTION MAMMAPLASTY Bilateral 1993   VP STUNT HEAD SURGERYOther]  2000    Prior to Admission medications   Medication Sig Start Date End Date Taking? Authorizing Provider  levothyroxine (SYNTHROID) 50 MCG tablet TAKE 1 TABLET BY MOUTH EVERY DAY 09/26/21  Yes McDonough, Lauren K, PA-C  metoprolol tartrate (LOPRESSOR) 25 MG tablet TAKE 1 TABLET BY MOUTH TWICE A DAY 08/10/21  Yes [name], MD  phenytoin (DILANTIN) 100 MG ER capsule TAKE 2 CAPSULES (200 MG TOTAL) BY MOUTH 2 (TWO) TIMES DAILY. 12/20/14  Yes [provider]  Calcium Carbonate-Vitamin D 600-200 MG-UNIT TABS Take by mouth.    [provider]  CALCIUM-VITAMIN D PO Take 1 tablet by mouth daily.    [provider]    Allergies as of 10/05/2021 - Review Complete 10/05/2021  Allergen Reaction Noted   Latex  06/19/2015    Family History  Problem Relation Age of Onset   Hypertension Father    CAD Father    Diabetes Sister    Arthritis Sister    Breast cancer Maternal Grandmother    CAD Paternal Grandfather    Ovarian cancer Maternal Aunt     Social History   Socioeconomic History   Marital status: Single    Spouse name: Not on file   Number of children: Not on file   Years of education: Not on file   Highest education level: Not  on file  Occupational History   Not on file  Tobacco Use   Smoking status: Never   Smokeless tobacco: Never  Vaping Use   Vaping Use: Never used  Substance and Sexual Activity   Alcohol use: No   Drug use: No   Sexual activity: Not on file  Other Topics Concern   Not on file  Social History Narrative   Not on file   Social Determinants of Health   Financial Resource Strain: Not on file  Food Insecurity: Not on file  Transportation Needs: Not on file  Physical Activity: Not on file  Stress: Not on file  Social Connections: Not on file  Intimate Partner Violence: Not on file    Review of Systems: See HPI, otherwise negative ROS  Physical Exam: BP 126/90   Pulse 86   Temp 98.7 F (37.1 C) (Temporal)   Resp 18   Ht 4' 7"  (1.397 m)   Wt 51.3 kg   SpO2 100%   BMI 26.26 kg/m  General:   Alert,  pleasant and cooperative in NAD Head:  Normocephalic and atraumatic. Neck:  Supple; no masses or thyromegaly. Lungs:  Clear throughout to auscultation.    Heart:  Regular rate and rhythm. Abdomen:  Soft, nontender and nondistended. Normal bowel sounds, without guarding, and without rebound.  Neurologic:  Alert and  oriented x4;  grossly normal neurologically.  Impression/Plan: Christina Stephens is here for an colonoscopy to be performed for cologuard positive test  Risks, benefits, limitations, and alternatives regarding  colonoscopy have been reviewed with the patient.  Questions have been answered.  All parties agreeable.   Sherri Sear, MD  11/04/2021, 7:42 AM

## 2021-11-04 NOTE — Anesthesia Preprocedure Evaluation (Signed)
Anesthesia Evaluation  Patient identified by MRN, date of birth, ID band Patient awake    Reviewed: Allergy & Precautions, NPO status , Patient's Chart, lab work & pertinent test results  History of Anesthesia Complications Negative for: history of anesthetic complications  Airway Mallampati: II  TM Distance: >3 FB Neck ROM: Full    Dental  (+) Poor Dentition   Pulmonary sleep apnea and Continuous Positive Airway Pressure Ventilation , neg COPD, Patient abstained from smoking.Not current smoker,    Pulmonary exam normal breath sounds clear to auscultation       Cardiovascular Exercise Tolerance: Good METShypertension, Pt. on medications (-) CAD and (-) Past MI (-) dysrhythmias  Rhythm:Regular Rate:Normal - Systolic murmurs    Neuro/Psych Seizures -, Well Controlled,  negative psych ROS   GI/Hepatic neg GERD  ,(+)     (-) substance abuse  ,   Endo/Other  neg diabetesHypothyroidism   Renal/GU negative Renal ROS     Musculoskeletal   Abdominal   Peds  Hematology   Anesthesia Other Findings Past Medical History: No date: CPAP (continuous positive airway pressure) dependence No date: Hereditary hemochromatosis (HCC) No date: Hypertension No date: Hypothyroidism No date: Seizures (HCC) No date: Sleep apnea No date: Spina bifida ([place]) No date: Thyroid disease     Comment:  hypothyroidism  Reproductive/Obstetrics Patient and mother state that she has never had sexual intercourse. I advised them of our Hcg policy and the potential risks of anesthesia to any unrecognized pregnancy, and they understand the risks and wish to waive the pregnancy test                             Anesthesia Physical Anesthesia Plan  ASA: 2  Anesthesia Plan: General   Post-op Pain Management: Minimal or no pain anticipated   Induction: Intravenous  PONV Risk Score and Plan: 3 and Propofol infusion, TIVA and  Ondansetron  Airway Management Planned: Nasal Cannula  Additional Equipment: None  Intra-op Plan:   Post-operative Plan:   Informed Consent: I have reviewed the patients History and Physical, chart, labs and discussed the procedure including the risks, benefits and alternatives for the proposed anesthesia with the patient or authorized representative who has indicated his/her understanding and acceptance.     Dental advisory given  Plan Discussed with: CRNA and Surgeon  Anesthesia Plan Comments: (Discussed risks of anesthesia with patient, including possibility of difficulty with spontaneous ventilation under anesthesia necessitating airway intervention, PONV, and rare risks such as cardiac or respiratory or neurological events, and allergic reactions. Discussed the role of CRNA in patient's perioperative care. Patient understands.)        Anesthesia Quick Evaluation

## 2021-11-04 NOTE — Anesthesia Procedure Notes (Signed)
Procedure Name: General with mask airway Date/Time: 11/04/2021 8:07 AM  Performed by: Kelton Pillar, CRNAPre-anesthesia Checklist: Patient identified, Emergency Drugs available, Suction available and Patient being monitored Patient Re-evaluated:Patient Re-evaluated prior to induction Oxygen Delivery Method: Simple face mask Induction Type: IV induction Placement Confirmation: CO2 detector, breath sounds checked- equal and bilateral and positive ETCO2 Dental Injury: Teeth and Oropharynx as per pre-operative assessment

## 2021-11-04 NOTE — Transfer of Care (Signed)
Immediate Anesthesia Transfer of Care Note  Patient: Christina Stephens  Procedure(s) Performed: COLONOSCOPY WITH PROPOFOL  Patient Location: Endoscopy Unit  Anesthesia Type:General  Level of Consciousness: awake, drowsy and patient cooperative  Airway & Oxygen Therapy: Patient Spontanous Breathing and Patient connected to face mask oxygen  Post-op Assessment: Report given to RN and Post -op Vital signs reviewed and stable  Post vital signs: Reviewed and stable  Last Vitals:  Vitals Value Taken Time  BP    Temp    Pulse    Resp    SpO2      Last Pain:  Vitals:   11/04/21 0702  TempSrc: Temporal  PainSc: 0-No pain         Complications: No notable events documented.

## 2021-11-04 NOTE — Anesthesia Postprocedure Evaluation (Signed)
Anesthesia Post Note  Patient: Christina Stephens  Procedure(s) Performed: COLONOSCOPY WITH PROPOFOL  Patient location during evaluation: Endoscopy Anesthesia Type: General Level of consciousness: awake and alert Pain management: pain level controlled Vital Signs Assessment: post-procedure vital signs reviewed and stable Respiratory status: spontaneous breathing, nonlabored ventilation, respiratory function stable and patient connected to nasal cannula oxygen Cardiovascular status: blood pressure returned to baseline and stable Postop Assessment: no apparent nausea or vomiting Anesthetic complications: no   No notable events documented.   Last Vitals:  Vitals:   11/04/21 0702 11/04/21 0820  BP: 126/90 102/67  Pulse: 86 84  Resp: 18 15  Temp: 37.1 C   SpO2: 100% 100%    Last Pain:  Vitals:   11/04/21 0820  TempSrc: Temporal  PainSc: 0-No pain                 Arita Miss

## 2021-11-04 NOTE — Op Note (Signed)
[facility] Gastroenterology Patient Name: Christina Stephens Procedure Date: 11/04/2021 7:48 AM MRN: [id number] Account #: [id number] Date of Birth: May 16, 1975 Admit Type: Outpatient Age: 46 Room: [facility] ENDO ROOM 4 Gender: Female Note Status: Finalized Instrument Name: Peds Colonoscope [id number] Procedure:             Colonoscopy Indications:           This is the patient's first colonoscopy, Positive                         Cologuard test Providers:             Lin Landsman MD, MD Referring MD:          Drema Dallas, PA Medicines:             General Anesthesia Complications:         No immediate complications. Estimated blood loss: None. Procedure:             Pre-Anesthesia Assessment:                        - Prior to the procedure, a History and Physical was                         performed, and patient medications and allergies were                         reviewed. The patient is competent. The risks and                         benefits of the procedure and the sedation options and                         risks were discussed with the patient. All questions                         were answered and informed consent was obtained.                         Patient identification and proposed procedure were                         verified by the physician, the nurse, the                         anesthesiologist, the anesthetist and the technician                         in the pre-procedure area in the procedure room in the                         endoscopy suite. Mental Status Examination: alert and                         oriented. Airway Examination: normal oropharyngeal                         airway and neck mobility. Respiratory Examination:  clear to auscultation. CV Examination: normal.                         Prophylactic Antibiotics: The patient does not require                         prophylactic antibiotics. Prior  Anticoagulants: The                         patient has taken no previous anticoagulant or                         antiplatelet agents. ASA Grade Assessment: II - A                         patient with mild systemic disease. After reviewing                         the risks and benefits, the patient was deemed in                         satisfactory condition to undergo the procedure. The                         anesthesia plan was to use general anesthesia.                         Immediately prior to administration of medications,                         the patient was re-assessed for adequacy to receive                         sedatives. The heart rate, respiratory rate, oxygen                         saturations, blood pressure, adequacy of pulmonary                         ventilation, and response to care were monitored                         throughout the procedure. The physical status of the                         patient was re-assessed after the procedure.                        After obtaining informed consent, the colonoscope was                         passed under direct vision. Throughout the procedure,                         the patient's blood pressure, pulse, and oxygen                         saturations were monitored continuously. The  Colonoscope was introduced through the anus and                         advanced to the the cecum, identified by appendiceal                         orifice and ileocecal valve. The colonoscopy was                         performed without difficulty. The patient tolerated                         the procedure well. The quality of the bowel                         preparation was evaluated using the BBPS [facility] Bowel                         Preparation Scale) with scores of: Right Colon = 3,                         Transverse Colon = 3 and Left Colon = 3 (entire mucosa                         seen well with no  residual staining, small fragments                         of stool or opaque liquid). The total BBPS score                         equals 9. Findings:      The perianal and digital rectal examinations were normal. Pertinent       negatives include normal sphincter tone and no palpable rectal lesions.      Two sessile polyps were found in the cecum. The polyps were diminutive       in size. These polyps were removed with a cold snare. Resection and       retrieval were complete.      The retroflexed view of the distal rectum and anal verge was normal and       showed no anal or rectal abnormalities.      The exam was otherwise without abnormality. Impression:            - Two diminutive polyps in the cecum, removed with a                         cold snare. Resected and retrieved.                        - The distal rectum and anal verge are normal on                         retroflexion view.                        - The examination was otherwise normal. Recommendation:        - Discharge patient to home (with parent).                        -  Resume regular diet today.                        - Continue present medications.                        - Await pathology results.                        - Repeat colonoscopy in 7-10 years for surveillance                         based on pathology results. Procedure Code(s):     --- Professional ---                        [phone], Colonoscopy, flexible; with removal of                         tumor(s), polyp(s), or other lesion(s) by snare                         technique Diagnosis Code(s):     --- Professional ---                        K63.5, Polyp of colon                        R19.5, Other fecal abnormalities CPT copyright 2019 American Medical Association. All rights reserved. The codes documented in this report are preliminary and upon coder review may  be revised to meet current compliance requirements. Dr. Ulyess Mort Lin Landsman MD, MD 11/04/2021 8:19:31 AM This report has been signed electronically. Number of Addenda: 0 Note Initiated On: 11/04/2021 7:48 AM Scope Withdrawal Time: 0 hours 13 minutes 6 seconds  Total Procedure Duration: 0 hours 18 minutes 32 seconds  Estimated Blood Loss:  Estimated blood loss: none.      [facility]

## 2021-11-05 ENCOUNTER — Encounter: Payer: Self-pay | Admitting: Gastroenterology

## 2021-11-05 LAB — SURGICAL PATHOLOGY

## 2021-11-07 ENCOUNTER — Encounter: Payer: Self-pay | Admitting: Oncology

## 2021-11-22 ENCOUNTER — Inpatient Hospital Stay: Attending: Oncology

## 2021-11-22 DIAGNOSIS — Z452 Encounter for adjustment and management of vascular access device: Secondary | ICD-10-CM | POA: Diagnosis present

## 2021-11-22 DIAGNOSIS — Z95828 Presence of other vascular implants and grafts: Secondary | ICD-10-CM

## 2021-11-22 MED ORDER — SODIUM CHLORIDE 0.9% FLUSH
10.0000 mL | Freq: Once | INTRAVENOUS | Status: AC
  Administered 2021-11-22: 10 mL via INTRAVENOUS
  Filled 2021-11-22: qty 10

## 2021-11-22 MED ORDER — HEPARIN SOD (PORK) LOCK FLUSH 100 UNIT/ML IV SOLN
500.0000 [IU] | Freq: Once | INTRAVENOUS | Status: AC
  Administered 2021-11-22: 500 [IU] via INTRAVENOUS
  Filled 2021-11-22: qty 5

## 2021-11-26 ENCOUNTER — Encounter

## 2021-12-10 ENCOUNTER — Encounter: Payer: Self-pay | Admitting: Oncology

## 2021-12-11 ENCOUNTER — Encounter: Payer: Self-pay | Admitting: Oncology

## 2022-01-12 ENCOUNTER — Encounter: Payer: Self-pay | Admitting: Oncology

## 2022-01-19 ENCOUNTER — Other Ambulatory Visit: Payer: Self-pay | Admitting: Lab

## 2022-01-19 ENCOUNTER — Inpatient Hospital Stay: Attending: Oncology

## 2022-01-19 DIAGNOSIS — G4730 Sleep apnea, unspecified: Secondary | ICD-10-CM | POA: Insufficient documentation

## 2022-01-19 DIAGNOSIS — Z79899 Other long term (current) drug therapy: Secondary | ICD-10-CM | POA: Insufficient documentation

## 2022-01-19 DIAGNOSIS — E039 Hypothyroidism, unspecified: Secondary | ICD-10-CM | POA: Diagnosis not present

## 2022-01-19 DIAGNOSIS — Z8041 Family history of malignant neoplasm of ovary: Secondary | ICD-10-CM | POA: Insufficient documentation

## 2022-01-19 DIAGNOSIS — Q059 Spina bifida, unspecified: Secondary | ICD-10-CM | POA: Diagnosis not present

## 2022-01-19 DIAGNOSIS — Z95828 Presence of other vascular implants and grafts: Secondary | ICD-10-CM

## 2022-01-19 DIAGNOSIS — G40909 Epilepsy, unspecified, not intractable, without status epilepticus: Secondary | ICD-10-CM | POA: Insufficient documentation

## 2022-01-19 DIAGNOSIS — I10 Essential (primary) hypertension: Secondary | ICD-10-CM | POA: Insufficient documentation

## 2022-01-19 DIAGNOSIS — Z803 Family history of malignant neoplasm of breast: Secondary | ICD-10-CM | POA: Insufficient documentation

## 2022-01-19 LAB — IRON AND TIBC
Iron: 63 ug/dL (ref 28–170)
Saturation Ratios: 19 % (ref 10.4–31.8)
TIBC: 339 ug/dL (ref 250–450)
UIBC: 276 ug/dL

## 2022-01-19 LAB — CBC WITH DIFFERENTIAL/PLATELET
Abs Immature Granulocytes: 0.01 10*3/uL (ref 0.00–0.07)
Basophils Absolute: 0 10*3/uL (ref 0.0–0.1)
Basophils Relative: 1 %
Eosinophils Absolute: 0.2 10*3/uL (ref 0.0–0.5)
Eosinophils Relative: 3 %
HCT: 35.7 % — ABNORMAL LOW (ref 36.0–46.0)
Hemoglobin: 11.9 g/dL — ABNORMAL LOW (ref 12.0–15.0)
Immature Granulocytes: 0 %
Lymphocytes Relative: 35 %
Lymphs Abs: 2.3 10*3/uL (ref 0.7–4.0)
MCH: 33.3 pg (ref 26.0–34.0)
MCHC: 33.3 g/dL (ref 30.0–36.0)
MCV: 100 fL (ref 80.0–100.0)
Monocytes Absolute: 0.6 10*3/uL (ref 0.1–1.0)
Monocytes Relative: 9 %
Neutro Abs: 3.5 10*3/uL (ref 1.7–7.7)
Neutrophils Relative %: 52 %
Platelets: 256 10*3/uL (ref 150–400)
RBC: 3.57 MIL/uL — ABNORMAL LOW (ref 3.87–5.11)
RDW: 12.1 % (ref 11.5–15.5)
WBC: 6.6 10*3/uL (ref 4.0–10.5)
nRBC: 0 % (ref 0.0–0.2)

## 2022-01-19 LAB — FERRITIN: Ferritin: 43 ng/mL (ref 11–307)

## 2022-01-19 MED ORDER — HEPARIN SOD (PORK) LOCK FLUSH 100 UNIT/ML IV SOLN
500.0000 [IU] | Freq: Once | INTRAVENOUS | Status: AC
  Administered 2022-01-19: 500 [IU] via INTRAVENOUS
  Filled 2022-01-19: qty 5

## 2022-01-25 ENCOUNTER — Inpatient Hospital Stay

## 2022-01-25 ENCOUNTER — Encounter: Payer: Self-pay | Admitting: Medical Oncology

## 2022-01-25 ENCOUNTER — Ambulatory Visit: Admitting: Oncology

## 2022-01-25 ENCOUNTER — Inpatient Hospital Stay (HOSPITAL_BASED_OUTPATIENT_CLINIC_OR_DEPARTMENT_OTHER): Admitting: Medical Oncology

## 2022-01-25 DIAGNOSIS — Z95828 Presence of other vascular implants and grafts: Secondary | ICD-10-CM

## 2022-01-25 NOTE — Progress Notes (Signed)
Per documentation from today's office visit, therapeutic phlebotomy was not indicated.

## 2022-01-25 NOTE — Addendum Note (Signed)
Addended by: Carl Best H on: 01/25/2022 03:23 PM   Modules accepted: Orders

## 2022-01-25 NOTE — Progress Notes (Signed)
[place]  [phone] [phone]  ID: Christina Stephens OB: Mar 04, 1976  MR#: [id number]  CSN#:[id number]  Patient Care Team: Carolynne Edouard as PCP - General (Physician Assistant) Lloyd Huger, MD as Consulting Physician (Oncology)  CHIEF COMPLAINT: Hereditary hemochromatosis.  History of present illness: Christina Stephens is a 46 year old female with past medical history significant for hypertension, sleep apnea, hypothyroidism, spina bifida, seizure disorder who is followed by Dr. Grayland Ormond for hereditary hemochromatosis.  She receives intermittent phlebotomy every 6 months.  INTERVAL HISTORY: Patient is a 46 year old female with above history including homozygous for hemochromatosis who returns to clinic for labs, follow-up, and consideration of phlebotomy.  She reports that she is feeling well today. Since her last visit she had a positive hemoccult screening and subsequent colonoscopy which reveled a benign polyp. Other than that no changes to her health. Of note, previously it was discussed that phlebotomies not occur unless Ferritin was above 50.   REVIEW OF SYSTEMS:   Review of Systems  Constitutional:  Negative for chills, fever, malaise/fatigue and weight loss.  HENT:  Negative for hearing loss, nosebleeds, sore throat and tinnitus.   Eyes:  Negative for blurred vision and double vision.  Respiratory:  Negative for cough, hemoptysis, shortness of breath and wheezing.   Cardiovascular:  Negative for chest pain, palpitations and leg swelling.  Gastrointestinal:  Negative for abdominal pain, blood in stool, constipation, diarrhea, melena, nausea and vomiting.  Genitourinary:  Negative for dysuria and urgency.  Musculoskeletal:  Negative for back pain, falls, joint pain and myalgias.  Skin:  Negative for itching and rash.  Neurological:  Negative for dizziness, tingling, sensory change, loss of consciousness, weakness and headaches.   Endo/Heme/Allergies:  Negative for environmental allergies. Does not bruise/bleed easily.  Psychiatric/Behavioral:  Negative for depression. The patient is not nervous/anxious and does not have insomnia.    As per HPI. Otherwise, a complete review of systems is negative.  PAST MEDICAL HISTORY: Past Medical History:  Diagnosis Date   CPAP (continuous positive airway pressure) dependence    Hereditary hemochromatosis ([place])    Hypertension    Hypothyroidism    Seizures ([place])    Sleep apnea    Spina bifida ([place])    Thyroid disease    hypothyroidism    PAST SURGICAL HISTORY: Past Surgical History:  Procedure Laterality Date   COLONOSCOPY WITH PROPOFOL N/A 11/04/2021   Procedure: COLONOSCOPY WITH PROPOFOL;  Surgeon: Lin Landsman, MD;  Location: [facility] ENDOSCOPY;  Service: Gastroenterology;  Laterality: N/A;   REDUCTION MAMMAPLASTY Bilateral 1993   VP STUNT HEAD SURGERYOther]  2000    FAMILY HISTORY: Family History  Problem Relation Age of Onset   Hypertension Father    CAD Father    Diabetes Sister    Arthritis Sister    Breast cancer Maternal Grandmother    CAD Paternal Grandfather    Ovarian cancer Maternal Aunt     ADVANCED DIRECTIVES (Y/N):  N  HEALTH MAINTENANCE: Social History   Tobacco Use   Smoking status: Never   Smokeless tobacco: Never  Vaping Use   Vaping Use: Never used  Substance Use Topics   Alcohol use: No   Drug use: No     Colonoscopy:  PAP:  Bone density:  Lipid panel:  Allergies  Allergen Reactions   Latex     Pt has Spina bifada and was told that if she has surgery she should avoid latex for poss. Reaction/infection.    Current Outpatient  Medications  Medication Sig Dispense Refill   Calcium Carbonate-Vitamin D 600-200 MG-UNIT TABS Take by mouth.     CALCIUM-VITAMIN D PO Take 1 tablet by mouth daily.     levothyroxine (SYNTHROID) 50 MCG tablet TAKE 1 TABLET BY MOUTH EVERY DAY 90 tablet 1   metoprolol tartrate (LOPRESSOR) 25  MG tablet TAKE 1 TABLET BY MOUTH TWICE A DAY 180 tablet 1   phenytoin (DILANTIN) 100 MG ER capsule TAKE 2 CAPSULES (200 MG TOTAL) BY MOUTH 2 (TWO) TIMES DAILY.  11   No current facility-administered medications for this visit.   Facility-Administered Medications Ordered in Other Visits  Medication Dose Route Frequency Provider Last Rate Last Admin   sodium chloride flush (NS) 0.9 % injection 10 mL  10 mL Intravenous PRN Charlaine Dalton R, MD   10 mL at 02/26/16 0917   sodium chloride flush (NS) 0.9 % injection 10 mL  10 mL Intravenous PRN Nolon Stalls C, MD   10 mL at 01/01/18 1108   sodium chloride flush (NS) 0.9 % injection 10 mL  10 mL Intravenous PRN Lloyd Huger, MD   10 mL at 04/22/20 1100    OBJECTIVE: Vitals:   01/25/22 1340  BP: 135/80  Pulse: 94  Temp: 97.8 F (36.6 C)      Body mass index is 24.75 kg/m.    ECOG FS:0 - Asymptomatic  Physical Exam Constitutional:      General: She is not in acute distress.    Appearance: She is well-developed. She is not ill-appearing.  Eyes:     General: No scleral icterus. Cardiovascular:     Rate and Rhythm: Normal rate and regular rhythm.  Pulmonary:     Effort: Pulmonary effort is normal.     Breath sounds: Normal breath sounds.  Abdominal:     General: Abdomen is flat. There is no distension.  Musculoskeletal:     Comments: Spina bifida. Braces.  Skin:    General: Skin is warm and dry.  Neurological:     Mental Status: She is alert and oriented to person, place, and time.  Psychiatric:        Mood and Affect: Mood normal.        Behavior: Behavior normal.      LAB RESULTS:  Lab Results  Component Value Date   NA 135 07/23/2021   K 3.9 07/23/2021   CL 98 07/23/2021   CO2 30 07/23/2021   GLUCOSE 102 (H) 07/23/2021   BUN 18 07/23/2021   CREATININE 0.51 07/23/2021   CALCIUM 8.7 (L) 07/23/2021   PROT 7.3 07/23/2021   ALBUMIN 4.0 07/23/2021   AST 24 07/23/2021   ALT 21 07/23/2021   ALKPHOS 61  07/23/2021   BILITOT 0.2 (L) 07/23/2021   GFRNONAA >60 07/23/2021   GFRAA 126 10/21/2019    Lab Results  Component Value Date   WBC 6.6 01/19/2022   NEUTROABS 3.5 01/19/2022   HGB 11.9 (L) 01/19/2022   HCT 35.7 (L) 01/19/2022   MCV 100.0 01/19/2022   PLT 256 01/19/2022   Lab Results  Component Value Date   IRON 63 01/19/2022   TIBC 339 01/19/2022   IRONPCTSAT 19 01/19/2022   Lab Results  Component Value Date   FERRITIN 43 01/19/2022     STUDIES: No results found.  ASSESSMENT: Hereditary hemochromatosis.   PLAN:    1. 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 Goal ferritin < 50. Diagnosed in July 2011. Previously had side effects of phelbotomies and was  on Exjade for iron chelation. Now off. Iron levels have stabilized. Traditionally she was having phlebotomies done every 6 months. Reviewed her labs from today which look good. Ferritin < 50. She elects to hold off on phlebotomy at this time. She will have PCP check ferritin in 3 months and RTC 6 months MD, labs (CBC, iron, ferritin) +- phlebotomy.   2. Screening for [facility]- Abdominal ultrasound from 06/20/2018 did not reveal any suspicious abnormalities in her liver. Last APF was 2.6. Plan repeat at next visit.   2.  Port maintenance:  Continue every 6 to 8-week port flushes.    Disposition- Holding Phlebotomy today 6 weeks- port flush 12 weeks- port flush 18 weeks- port flush 6 mo- port/lab- cbc, ferritin, AFP Day to week later, see [facility] +/- phlebotomy- [place]  Patient expressed understanding and was in agreement with this plan. She also understands that She can call clinic at any time with any questions, concerns, or complaints.   Hughie Closs, PA-C   01/25/2022

## 2022-02-05 ENCOUNTER — Other Ambulatory Visit: Payer: Self-pay | Admitting: Internal Medicine

## 2022-02-05 DIAGNOSIS — I10 Essential (primary) hypertension: Secondary | ICD-10-CM

## 2022-03-05 ENCOUNTER — Emergency Department

## 2022-03-05 ENCOUNTER — Other Ambulatory Visit: Payer: Self-pay

## 2022-03-05 ENCOUNTER — Encounter: Payer: Self-pay | Admitting: Emergency Medicine

## 2022-03-05 ENCOUNTER — Emergency Department
Admission: EM | Admit: 2022-03-05 | Discharge: 2022-03-05 | Disposition: A | Discharge status: Home / Self Care | Attending: Emergency Medicine | Admitting: Emergency Medicine

## 2022-03-05 DIAGNOSIS — E039 Hypothyroidism, unspecified: Secondary | ICD-10-CM | POA: Insufficient documentation

## 2022-03-05 DIAGNOSIS — W2201XA Walked into wall, initial encounter: Secondary | ICD-10-CM | POA: Insufficient documentation

## 2022-03-05 DIAGNOSIS — S0990XA Unspecified injury of head, initial encounter: Secondary | ICD-10-CM | POA: Diagnosis present

## 2022-03-05 DIAGNOSIS — S0181XA Laceration without foreign body of other part of head, initial encounter: Secondary | ICD-10-CM

## 2022-03-05 DIAGNOSIS — I10 Essential (primary) hypertension: Secondary | ICD-10-CM | POA: Insufficient documentation

## 2022-03-05 MED ORDER — LIDOCAINE-EPINEPHRINE-TETRACAINE (LET) TOPICAL GEL
3.0000 mL | Freq: Once | TOPICAL | Status: AC
  Administered 2022-03-05: 3 mL via TOPICAL
  Filled 2022-03-05: qty 3

## 2022-03-05 MED ORDER — LIDOCAINE HCL (PF) 1 % IJ SOLN
5.0000 mL | Freq: Once | INTRAMUSCULAR | Status: AC
  Administered 2022-03-05: 5 mL
  Filled 2022-03-05: qty 5

## 2022-03-05 NOTE — ED Triage Notes (Signed)
Pt reports was walking with her crutches and fell hitting her head on the wall. Pt with laceration to middle of forehead up to scalp. No LOC, no NV, does not take blood thinners. Denies pain elsewhere.

## 2022-03-05 NOTE — ED Notes (Signed)
See triage note  Presents s/p fall  States she tripped   hit the edge os a door  laceration to forehead  Denies any LOC

## 2022-03-05 NOTE — ED Provider Notes (Signed)
[facility] Provider Note    Event Date/Time   First MD Initiated Contact with Patient 03/05/22 [phone]     (approximate)   History   Fall and Laceration   HPI  Christina Stephens is a 46 y.o. female   presents to the ED after hitting her head on the wall this morning.  She has a laceration to her forehead.  Patient states that there was no loss of consciousness she denies any visual changes, nausea or, vomiting or dizziness.  Patient uses crutches to walk as she has spina bifida.  No blood thinners are taken and patient denies any other injuries.  Tetanus is up-to-date.  Patient has history of hemochromatosis, hypertension, spina bifida, hypothyroidism, seizure disorder, and neurogenic bladder.      Physical Exam   Triage Vital Signs: ED Triage Vitals  Enc Vitals Group     BP 03/05/22 0724 119/79     Pulse Rate 03/05/22 0724 73     Resp 03/05/22 0724 20     Temp 03/05/22 0724 98.4 F (36.9 C)     Temp Source 03/05/22 0724 Oral     SpO2 03/05/22 0724 100 %     Weight 03/05/22 0720 105 lb 13.1 oz (48 kg)     Height 03/05/22 0720 '4\' 7"'$  (1.397 m)     Head Circumference --      Peak Flow --      Pain Score 03/05/22 0719 4     Pain Loc --      Pain Edu? --      Excl. in [place]? --     Most recent vital signs: Vitals:   03/05/22 0724 03/05/22 1153  BP: 119/79 120/70  Pulse: 73 70  Resp: 20 18  Temp: 98.4 F (36.9 C)   SpO2: 100% 100%     General: Awake, no distress.  Alert, talkative and cooperative. CV:  Good peripheral perfusion.  Heart regular rate and rhythm. Resp:  Normal effort.  Lungs are clear. Abd:  No distention.  Other:  Left upper forehead into the hairline is a 3 cm laceration without foreign body and minimal bleeding at this time.   ED Results / Procedures / Treatments   Labs (all labs ordered are listed, but only abnormal results are displayed) Labs Reviewed - No data to display    RADIOLOGY  CT head per radiologist is  negative for acute intracranial injury.  CT scan cervical spine is negative for fracture.  Degenerative changes were noted with loss of disc space height at C4-C5 and C6-C7.   PROCEDURES:  Critical Care performed:   Marland KitchenMarland KitchenLaceration Repair  Date/Time: 03/05/2022 10:21 AM  Performed by: Johnn Hai, PA-C Authorized by: Johnn Hai, PA-C   Consent:    Consent obtained:  Verbal   Consent given by:  Patient   Risks discussed:  Infection, pain, poor cosmetic result and poor wound healing Universal protocol:    Patient identity confirmed:  Verbally with patient Anesthesia:    Anesthesia method:  Topical application   Topical anesthetic:  LET Laceration details:    Location:  Face   Face location:  Forehead   Length (cm):  3 Pre-procedure details:    Preparation:  Patient was prepped and draped in usual sterile fashion and imaging obtained to evaluate for foreign bodies Exploration:    Limited defect created (wound extended): no     Hemostasis achieved with:  Direct pressure, LET and tied off vessels  Contaminated: no   Treatment:    Area cleansed with:  Saline   Amount of cleaning:  Standard   Irrigation solution:  Sterile saline   Irrigation method:  Tap and syringe   Layers/structures repaired:  Deep dermal/superficial fascia Deep dermal/superficial fascia:    Suture size:  6-0   Suture material:  Vicryl   Suture technique:  Horizontal mattress and buried horizontal mattress   Number of sutures:  2 Skin repair:    Repair method:  Sutures and staples   Suture size:  6-0   Suture material:  Nylon   Suture technique:  Simple interrupted   Number of sutures:  5   Number of staples:  3 Approximation:    Approximation:  Close Repair type:    Repair type:  Simple Post-procedure details:    Dressing:  Non-adherent dressing   Procedure completion:  Tolerated    MEDICATIONS ORDERED IN ED: Medications  lidocaine-EPINEPHrine-tetracaine (LET) topical gel (3 mLs  Topical Given 03/05/22 1005)  lidocaine (PF) (XYLOCAINE) 1 % injection 5 mL (5 mLs Infiltration Given 03/05/22 1114)     IMPRESSION / MDM / [place] / ED COURSE  I reviewed the triage vital signs and the nursing notes.   Differential diagnosis includes, but is not limited to, laceration scalp, head injury, cervical injury, contusion forehead, cervical strain.  45 year old female presents to the ED with laceration to her forehead that occurred today when she hit her head on a wall in her home.  Patient has a history of spina bifida and walks with crutches.  She denies any loss of consciousness or any other injuries.  CT scan of the head and cervical spine were reassuring and family and patient were made aware.  Sutures and staples were applied which patient tolerated well.  They are given instructions on how to care for this area and also that the sutures and staples could be removed together in 7 days.  They are to watch for any signs of infection.  Continue with her regular medication and Tylenol if needed for pain.      Patient's presentation is most consistent with acute complicated illness / injury requiring diagnostic workup.  FINAL CLINICAL IMPRESSION(S) / ED DIAGNOSES   Final diagnoses:  Facial laceration, initial encounter     Rx / DC Orders   ED Discharge Orders     None        Note:  This document was prepared using Dragon voice recognition software and may include unintentional dictation errors.   Johnn Hai, PA-C 03/05/22 1213    Duffy Bruce, MD 03/05/22 2032

## 2022-03-05 NOTE — Discharge Instructions (Addendum)
Area clean and dry.  Clean daily with mild soap and water and allowed to dry completely.  The sutures and staples should be removed in approximately 5 to 7 days.  There are 5 sutures and 3 staples.  Ask your primary care provider if they are able to take these out for you in the office.  If not [place] urgent care should be able to do these without you having to return to the emergency department. You may take Tylenol if needed for pain.

## 2022-03-06 ENCOUNTER — Encounter: Payer: Self-pay | Admitting: Oncology

## 2022-03-07 ENCOUNTER — Encounter: Payer: Self-pay | Admitting: Physician Assistant

## 2022-03-07 ENCOUNTER — Telehealth: Payer: Self-pay | Admitting: Physician Assistant

## 2022-03-07 ENCOUNTER — Ambulatory Visit (INDEPENDENT_AMBULATORY_CARE_PROVIDER_SITE_OTHER): Payer: PRIVATE HEALTH INSURANCE | Admitting: Physician Assistant

## 2022-03-07 ENCOUNTER — Encounter: Payer: Self-pay | Admitting: Oncology

## 2022-03-07 DIAGNOSIS — I10 Essential (primary) hypertension: Secondary | ICD-10-CM | POA: Diagnosis not present

## 2022-03-07 DIAGNOSIS — G4733 Obstructive sleep apnea (adult) (pediatric): Secondary | ICD-10-CM | POA: Diagnosis not present

## 2022-03-07 DIAGNOSIS — Z23 Encounter for immunization: Secondary | ICD-10-CM

## 2022-03-07 DIAGNOSIS — E039 Hypothyroidism, unspecified: Secondary | ICD-10-CM | POA: Diagnosis not present

## 2022-03-07 DIAGNOSIS — S0181XD Laceration without foreign body of other part of head, subsequent encounter: Secondary | ICD-10-CM

## 2022-03-07 MED ORDER — LEVOTHYROXINE SODIUM 50 MCG PO TABS: 50.0000 ug | ORAL_TABLET | Freq: Every day | ORAL | 1 refills | Status: DC

## 2022-03-07 NOTE — Telephone Encounter (Signed)
SS order placed in [facility] folder-Toni 

## 2022-03-07 NOTE — Progress Notes (Signed)
[facility] [place], [place] [address]  Internal MEDICINE  Office Visit Note  Patient Name: Christina Stephens  [id number]  [id number]  Date of Service: 03/07/2022  Chief Complaint  Patient presents with   Hypertension   Follow-up    HPI Pt is here for routine follow up -did have a fall on saturday and went to ED and got stitches and staples in forehead. Reports she lost her balance which led to fall. Thinks she got going too wuickly with her crutches and got caught on door frame which threw off her balance. She denies any LOC. Did have CT head and neck in ED which did not show any acute abnormalities beyond facial laceration. -Advised by ED to have stitches/staples removed in 7 days and will go to urgent care or ED for this. -Did also have a problem with cpap recently where she got up and forgot to unhook tubing and pulled machine down. It gives an error message for humidity. Her machine is over 100 years old though and is in need of replacement anyway. Her past few downloads do not show her apnea is controlled therefore will go ahead and order titration study prior to ordering a new machine.  Current Medication: Outpatient Encounter Medications as of 03/07/2022  Medication Sig Note   Calcium Carbonate-Vitamin D 600-200 MG-UNIT TABS Take by mouth.    CALCIUM-VITAMIN D PO Take 1 tablet by mouth daily.    metoprolol tartrate (LOPRESSOR) 25 MG tablet TAKE 1 TABLET BY MOUTH TWICE A DAY    phenytoin (DILANTIN) 100 MG ER capsule TAKE 2 CAPSULES (200 MG TOTAL) BY MOUTH 2 (TWO) TIMES DAILY. 01/02/2015: Received from: External Pharmacy   [DISCONTINUED] levothyroxine (SYNTHROID) 50 MCG tablet TAKE 1 TABLET BY MOUTH EVERY DAY    levothyroxine (SYNTHROID) 50 MCG tablet Take 1 tablet (50 mcg total) by mouth daily.    Facility-Administered Encounter Medications as of 03/07/2022  Medication   sodium chloride flush (NS) 0.9 % injection 10 mL   sodium chloride flush (NS) 0.9 %  injection 10 mL   sodium chloride flush (NS) 0.9 % injection 10 mL    Surgical History: Past Surgical History:  Procedure Laterality Date   COLONOSCOPY WITH PROPOFOL N/A 11/04/2021   Procedure: COLONOSCOPY WITH PROPOFOL;  Surgeon: Lin Landsman, MD;  Location: [facility] ENDOSCOPY;  Service: Gastroenterology;  Laterality: N/A;   REDUCTION MAMMAPLASTY Bilateral 1993   VP STUNT HEAD SURGERYOther]  2000    Medical History: Past Medical History:  Diagnosis Date   CPAP (continuous positive airway pressure) dependence    Hereditary hemochromatosis ([place])    Hypertension    Hypothyroidism    Seizures ([place])    Sleep apnea    Spina bifida ([place])    Thyroid disease    hypothyroidism    Family History: Family History  Problem Relation Age of Onset   Hypertension Father    CAD Father    Diabetes Sister    Arthritis Sister    Breast cancer Maternal Grandmother    CAD Paternal Grandfather    Ovarian cancer Maternal Aunt     Social History   Socioeconomic History   Marital status: Single    Spouse name: Not on file   Number of children: Not on file   Years of education: Not on file   Highest education level: Not on file  Occupational History   Not on file  Tobacco Use   Smoking status: Never   Smokeless tobacco:  Never  Vaping Use   Vaping Use: Never used  Substance and Sexual Activity   Alcohol use: No   Drug use: No   Sexual activity: Not Currently  Other Topics Concern   Not on file  Social History Narrative   Not on file   Social Determinants of Health   Financial Resource Strain: Not on file  Food Insecurity: Not on file  Transportation Needs: Not on file  Physical Activity: Not on file  Stress: Not on file  Social Connections: Not on file  Intimate Partner Violence: Not on file      Review of Systems  Constitutional:  Negative for chills, fatigue and unexpected weight change.  HENT:  Negative for congestion, rhinorrhea, sneezing and sore throat.    Eyes:  Negative for redness.  Respiratory:  Negative for cough, chest tightness and shortness of breath.   Cardiovascular:  Negative for chest pain and palpitations.  Gastrointestinal:  Negative for abdominal pain, constipation, diarrhea, nausea and vomiting.  Genitourinary:  Negative for dysuria and frequency.  Musculoskeletal:  Negative for arthralgias, back pain, joint swelling and neck pain.  Skin:  Positive for wound. Negative for rash.       Stiches on face  Neurological: Negative.  Negative for tremors and numbness.  Hematological:  Negative for adenopathy. Does not bruise/bleed easily.  Psychiatric/Behavioral:  Negative for behavioral problems (Depression), sleep disturbance and suicidal ideas. The patient is not nervous/anxious.     Vital Signs: BP 129/61   Pulse 76   Temp 98.2 F (36.8 C)   Resp 16   Ht '4\' 7"'$  (1.397 m)   LMP 03/05/2022 (Exact Date)   SpO2 99%   BMI 24.60 kg/m    Physical Exam Vitals and nursing note reviewed.  Constitutional:      General: She is not in acute distress.    Appearance: Normal appearance. She is normal weight. She is not ill-appearing.  HENT:     Head: Normocephalic.  Eyes:     Pupils: Pupils are equal, round, and reactive to light.  Cardiovascular:     Rate and Rhythm: Normal rate and regular rhythm.     Pulses: Normal pulses.     Heart sounds: Normal heart sounds.  Pulmonary:     Effort: Pulmonary effort is normal.     Breath sounds: Normal breath sounds.  Skin:    General: Skin is warm and dry.     Capillary Refill: Capillary refill takes less than 2 seconds.     Comments: Sutures visible along top of forehead from facial lac after fall on saturday  Neurological:     Mental Status: She is alert and oriented to person, place, and time.  Psychiatric:        Mood and Affect: Mood normal.        Behavior: Behavior normal.        Thought Content: Thought content normal.        Judgment: Judgment normal.         Assessment/Plan: 1. Essential hypertension Stable, continue current medication  2. Obstructive sleep apnea Will order titration prior to ordering replacement machine to ensure appropriate settings - Cpap titration; Future  3. Facial laceration, subsequent encounter Healing well, sutures in place and will have these removed after 7 days as instructed by ED  4. Acquired hypothyroidism - levothyroxine (SYNTHROID) 50 MCG tablet; Take 1 tablet (50 mcg total) by mouth daily.  Dispense: 90 tablet; Refill: 1  5. Flu vaccine need -  Flu Vaccine MDCK QUAD PF   General Counseling: Jacobi verbalizes understanding of the findings of todays visit and agrees with plan of treatment. I have discussed any further diagnostic evaluation that may be needed or ordered today. We also reviewed her medications today. she has been encouraged to call the office with any questions or concerns that should arise related to todays visit.    Orders Placed This Encounter  Procedures   Flu Vaccine MDCK QUAD PF   Cpap titration    Meds ordered this encounter  Medications   levothyroxine (SYNTHROID) 50 MCG tablet    Sig: Take 1 tablet (50 mcg total) by mouth daily.    Dispense:  90 tablet    Refill:  1    This patient was seen by Drema Dallas, PA-C in collaboration with Dr. Clayborn Bigness as a part of collaborative care agreement.   Total time spent:30 Minutes Time spent includes review of chart, medications, test results, and follow up plan with the patient.      Dr [name] Internal medicine

## 2022-03-08 ENCOUNTER — Inpatient Hospital Stay: Payer: PRIVATE HEALTH INSURANCE | Attending: Oncology

## 2022-03-08 DIAGNOSIS — Z95828 Presence of other vascular implants and grafts: Secondary | ICD-10-CM

## 2022-03-08 DIAGNOSIS — Z452 Encounter for adjustment and management of vascular access device: Secondary | ICD-10-CM | POA: Insufficient documentation

## 2022-03-08 MED ORDER — HEPARIN SOD (PORK) LOCK FLUSH 100 UNIT/ML IV SOLN
500.0000 [IU] | Freq: Once | INTRAVENOUS | Status: AC
  Administered 2022-03-08: 500 [IU] via INTRAVENOUS
  Filled 2022-03-08: qty 5

## 2022-03-08 MED ORDER — SODIUM CHLORIDE 0.9% FLUSH
10.0000 mL | Freq: Once | INTRAVENOUS | Status: AC
  Administered 2022-03-08: 10 mL via INTRAVENOUS
  Filled 2022-03-08: qty 10

## 2022-03-11 ENCOUNTER — Ambulatory Visit: Admission: EM | Admit: 2022-03-11 | Discharge: 2022-03-11 | Disposition: A | Discharge status: Home / Self Care

## 2022-03-11 DIAGNOSIS — Z4802 Encounter for removal of sutures: Secondary | ICD-10-CM

## 2022-03-11 DIAGNOSIS — S0181XD Laceration without foreign body of other part of head, subsequent encounter: Secondary | ICD-10-CM | POA: Diagnosis not present

## 2022-03-11 NOTE — ED Triage Notes (Addendum)
Patient to Urgent Care for suture and staple removal, both placed on 10/28.   5 sutures and 3 staples present to center of forehead/ scalp.

## 2022-03-11 NOTE — Discharge Instructions (Addendum)
Follow up with your primary care provider as needed.

## 2022-03-11 NOTE — ED Provider Notes (Signed)
Christina Stephens    CSN: [id number] Arrival date & time: 03/11/22  1124      History   Chief Complaint Chief Complaint  Patient presents with   Suture / Staple Removal    HPI Christina Stephens is a 46 y.o. female.  Accompanied by her sister, patient presents for removal of sutures and staples.  She was seen in the ED on 03/05/2022 for facial laceration; 5 sutures and 3 staples.  No wound drainage, fever, chills, or other symptoms.  The history is provided by the patient, a relative and medical records.    Past Medical History:  Diagnosis Date   CPAP (continuous positive airway pressure) dependence    Hereditary hemochromatosis ([place])    Hypertension    Hypothyroidism    Seizures ([place])    Sleep apnea    Spina bifida ([place])    Thyroid disease    hypothyroidism    Patient Active Problem List   Diagnosis Date Noted   Positive colorectal cancer screening using Cologuard test    Polyp of colon    Encounter for general adult medical examination with abnormal findings 09/25/2019   Encounter for screening mammogram for malignant neoplasm of breast 09/25/2019   Dysuria 10/01/2018   Acquired hypothyroidism 06/12/2018   Routine cervical smear 06/12/2018   Needs flu shot 02/04/2018   Essential hypertension 05/31/2017   Sleep apnea 05/31/2017   Spina bifida ([place]) 05/31/2017   Hemochromatosis 11/28/2014   Word finding difficulty 02/28/2013   Seizure disorder ([place]) 02/04/2013   Neurogenic bladder 02/04/2013   Left ankle sprain 09/29/2011   Left leg pain 09/29/2011    Past Surgical History:  Procedure Laterality Date   COLONOSCOPY WITH PROPOFOL N/A 11/04/2021   Procedure: COLONOSCOPY WITH PROPOFOL;  Surgeon: Lin Landsman, MD;  Location: [facility];  Service: Gastroenterology;  Laterality: N/A;   REDUCTION MAMMAPLASTY Bilateral 1993   VP STUNT HEAD SURGERYOther]  2000    OB History   No obstetric history on file.      Home Medications    Prior to  Admission medications   Medication Sig Start Date End Date Taking? Authorizing Provider  Calcium Carbonate-Vitamin D 600-200 MG-UNIT TABS Take by mouth.    [provider]  CALCIUM-VITAMIN D PO Take 1 tablet by mouth daily.    [provider]  levothyroxine (SYNTHROID) 50 MCG tablet Take 1 tablet (50 mcg total) by mouth daily. 03/07/22   McDonough, Christina Gaul, PA-C  metoprolol tartrate (LOPRESSOR) 25 MG tablet TAKE 1 TABLET BY MOUTH TWICE A DAY 02/07/22   McDonough, Christina K, PA-C  phenytoin (DILANTIN) 100 MG ER capsule TAKE 2 CAPSULES (200 MG TOTAL) BY MOUTH 2 (TWO) TIMES DAILY. 12/20/14   [provider]    Family History Family History  Problem Relation Age of Onset   Hypertension Father    CAD Father    Diabetes Sister    Arthritis Sister    Breast cancer Maternal Grandmother    CAD Paternal Grandfather    Ovarian cancer Maternal Aunt     Social History Social History   Tobacco Use   Smoking status: Never   Smokeless tobacco: Never  Vaping Use   Vaping Use: Never used  Substance Use Topics   Alcohol use: No   Drug use: No     Allergies   Latex   Review of Systems Review of Systems  Constitutional:  Negative for chills and fever.  Skin:  Positive for wound. Negative for  color change.  All other systems reviewed and are negative.    Physical Exam Triage Vital Signs ED Triage Vitals  Enc Vitals Group     BP 03/11/22 1138 128/72     Pulse Rate 03/11/22 1138 76     Resp 03/11/22 1138 17     Temp 03/11/22 1138 98.8 F (37.1 C)     Temp src --      SpO2 03/11/22 1138 98 %     Weight 03/11/22 1136 105 lb 13.1 oz (48 kg)     Height 03/11/22 1136 '4\' 7"'$  (1.397 m)     Head Circumference --      Peak Flow --      Pain Score 03/11/22 1136 0     Pain Loc --      Pain Edu? --      Excl. in [place]? --    No data found.  Updated Vital Signs BP 128/72   Pulse 76   Temp 98.8 F (37.1 C)   Resp 17   Ht '4\' 7"'$  (1.397 m)   Wt 105 lb 13.1 oz  (48 kg)   LMP 03/05/2022 (Exact Date)   SpO2 98%   BMI 24.60 kg/m   Visual Acuity Right Eye Distance:   Left Eye Distance:   Bilateral Distance:    Right Eye Near:   Left Eye Near:    Bilateral Near:     Physical Exam Vitals and nursing note reviewed.  Constitutional:      General: She is not in acute distress.    Appearance: She is well-developed.  HENT:     Mouth/Throat:     Mouth: Mucous membranes are moist.  Cardiovascular:     Rate and Rhythm: Normal rate and regular rhythm.  Pulmonary:     Effort: Pulmonary effort is normal. No respiratory distress.     Breath sounds: Normal breath sounds.  Musculoskeletal:     Cervical back: Neck supple.  Skin:    General: Skin is warm and dry.     Findings: Lesion present.     Comments: Forehead laceration appears to be healing well. No drainage. See picture.   Neurological:     Mental Status: She is alert.  Psychiatric:        Mood and Affect: Mood normal.        Behavior: Behavior normal.      UC Treatments / Results  Labs (all labs ordered are listed, but only abnormal results are displayed) Labs Reviewed - No data to display  EKG   Radiology No results found.  Procedures Procedures (including critical care time)  Medications Ordered in UC Medications - No data to display  Initial Impression / Assessment and Plan / UC Course  I have reviewed the triage vital signs and the nursing notes.  Pertinent labs & imaging results that were available during my care of the patient were reviewed by me and considered in my medical decision making (see chart for details).    Laceration of forehead, suture and staple removal.  Healing well. Sutures and staples removed by RN.  Education provided on wound care after suture removal.  Instructed her to follow up with her PCP as needed.  Patient and her sister agree to plan of care.   Final Clinical Impressions(s) / UC Diagnoses   Final diagnoses:  Laceration of forehead,  subsequent encounter  Encounter for removal of sutures     Discharge Instructions      Follow  up with your primary care provider as needed.        ED Prescriptions   None    PDMP not reviewed this encounter.   Sharion Balloon, NP 03/11/22 1154

## 2022-03-14 ENCOUNTER — Ambulatory Visit: Payer: PRIVATE HEALTH INSURANCE | Admitting: Physician Assistant

## 2022-03-14 ENCOUNTER — Telehealth: Payer: Self-pay | Admitting: Physician Assistant

## 2022-03-14 NOTE — Telephone Encounter (Signed)
Patient scheduled for psg on 03/22/22 @ [facility]tat

## 2022-03-15 ENCOUNTER — Telehealth: Payer: Self-pay | Admitting: Physician Assistant

## 2022-03-22 ENCOUNTER — Encounter (INDEPENDENT_AMBULATORY_CARE_PROVIDER_SITE_OTHER): Payer: PRIVATE HEALTH INSURANCE | Admitting: Internal Medicine

## 2022-03-22 DIAGNOSIS — G4733 Obstructive sleep apnea (adult) (pediatric): Secondary | ICD-10-CM

## 2022-03-23 ENCOUNTER — Telehealth: Payer: Self-pay | Admitting: Physician Assistant

## 2022-03-23 NOTE — Telephone Encounter (Signed)
SS scheduled 03/22/22-Toni

## 2022-03-29 NOTE — Procedures (Signed)
[place] Report Part I  Phone: [phone] Fax: [phone]  Patient Name: Christina Stephens, Christina Stephens. Acquisition Number: [id number]  Date of Birth: 1975/05/28 Acquisition Date: 03/22/2022  Referring Physician: Drema Dallas, PA-C     History: The patient is a 46 year old female with obstructive sleep apnea for CPAP titration. Medical History: CPAP dependence, hereditray hemochromatosis, hypertension, hypothyroidism, seizures, sleep apnea, Spina bifida and thyroid disease.  Medications: calcium carbonate-Vitamin D, levothyroxine, metoprolol tartrate and phenytoin.  Procedure: This routine overnight polysomnogram was performed on the [place] 4 or 5 using the standard CPAP protocol.  This included 6 channels of EEG, 2 channels of EOG, chin EMG, bilateral anterior tibialis EMG, nasal/oral thermistor, PTAF (nasal pressure transducer), chest and abdominal wall movements, EKG, and pulse oximetry.  Description: The total recording time was 386.0 minutes. The total sleep time was 213.5 minutes. There were a total of 162.3 minutes of wakefulness after sleep onset for a poorsleep efficiency of 55.3%. The latency to sleep onset was within normal limits at 10.2 minutes. The R sleep onset latency was slightly prolonged at 126.5 minutes. Sleep parameters, as a percentage of the total sleep time, demonstrated 31.9% of sleep was in N1 sleep, 33.0% N2, 9.8% N3 and 25.3% R sleep. There were a total of 154 arousals for an arousal index of 43.3 arousals per hour of sleep that was elevated.  Overall, there were a total of 23 respiratory events for a respiratory disturbance index, which includes apneas, hypopneas and RERAs (increased respiratory effort) of 6.5 respiratory events per hour of sleep during the pressure titration. CPAP was initiated at 5 cm H2O at lights out, 11:36 p.m. It was titrated in 1-2 cm increments for intermittent respiratory events to 9 cm H2O. The apnea was  controlled at this pressure and supine, REM sleep was observed. The pressure was further titrated to the final pressure of 10 cm H2O for a few RERAS.   Additionally, the baseline oxygen saturation during wakefulness was 99%, during NREM sleep averaged 99%, and during REM sleep averaged 99%. The total duration of oxygen < 90% was 0.6 minutes.  Cardiac monitoring- There were no significant cardiac rhythm irregularities.   Periodic limb movement monitoring- demonstrated that there were 187 periodic limb movements for a periodic limb movement index of 52.6 periodic limb movements per hour of sleep. Quasi-periodic limb movements were observed during periods of wakefulness and fragmentary myoclonus was observed throughout the study.   Impression: This patient's obstructive sleep apnea demonstrated significant improvement with the utilization of nasal CPAP at 10 cm H2O.   There was a significantly elevated periodic limb movement index of 52.6 periodic limb movements per hour of sleep. Quasi-periodic limb movements were observed during periods of wakefulness and fragmentary myoclonus was observed throughout the study. Treatment may be indicated if sleep disruption or sleepiness persist once the patient is fully compliant with CPAP.  Recommendations: Would recommend utilization of nasal CPAP at 10 cm H2O.      A small [employer] Eson Nasal mask was used. Chin strap used during study- no. Humidifier used during study- yes.     Allyne Gee, MD, [facility] Diplomate ABMS-Pulmonary, Critical Care and Sleep Medicine  Electronically reviewed and digitally signed   [place] CPAP/BIPAP Polysomnogram Report Part II Phone: [phone] Fax: [phone]  Patient last name [place] Neck Size 14.0 in. Acquisition [id number]  Patient first name Christina Stephens Weight 113.0 lbs. Started 03/22/2022 at 11:29:47  PM  Birth date 27-Apr-1976 Height 55.0 in. Stopped 03/23/2022 at 7:28:11 AM  Age 14      Type  Adult BMI 26.3 lb/in2 Duration 386.0  Report generated by Juleen Starr, RPSGT  Reviewed by: Richelle Ito. Henke, PhD, ABSM, FAASM Sleep Data: Lights Out: 11:36:05 PM Sleep Onset: 11:46:17 PM  Lights On: 6:02:05 AM Sleep Efficiency: 55.3 %  Total Recording Time: 386.0 min Sleep Latency (from Lights Off) 10.2 min  Total Sleep Time (TST): 213.5 min R Latency (from Sleep Onset): 126.5 min  Sleep Period Time: 370.5 min Total number of awakenings: 29  Wake during sleep: 157.0 min Wake After Sleep Onset (WASO): 162.3 min   Sleep Data:         Arousal Summary: Stage  Latency from lights out (min) Latency from sleep onset (min) Duration (min) % Total Sleep Time  Normal values  N 1 10.2 0.0 68.0 31.9 (5%)  N 2 59.7 49.5 70.5 33.0 (50%)  N 3 118.7 108.5 21.0 9.8 (20%)  R 136.7 126.5 54.0 25.3 (25%)    Number Index  Spontaneous 112 31.5  Apneas & Hypopneas 3 0.8  RERAs 16 4.5       (Apneas & Hypopneas & RERAs)  (19) (5.3)  Limb Movement 40 11.2  Snore 0 0.0  TOTAL 171 48.1     Respiratory Data:  CA OA MA Apnea Hypopnea* A+ H RERA Total  Number 0 0 '1 1 6 7 16 23  '$ Mean Dur (sec) 0.0 0.0 33.5 33.5 32.8 32.9 28.8 30.0  Max Dur (sec) 0.0 0.0 33.5 33.5 42.5 42.5 37.5 42.5  Total Dur (min) 0.0 0.0 0.6 0.6 3.3 3.8 7.7 11.5  % of TST 0.0 0.0 0.3 0.3 1.5 1.8 3.6 5.4  Index (#/h TST) 0.0 0.0 0.3 0.3 1.7 2.0 4.5 6.5  *Hypopneas scored based on 4% or greater desaturation.  Sleep Stage:         REM NREM TST  AHI 1.1 2.3 2.0  RDI 4.4 7.1 6.5    Sleep (min) TST (%) REM (min) NREM (min) CA (#) OA (#) MA (#) HYP (#) AHI (#/h) RERA (#) RDI (#/h) Desat (#)  Supine 102.5 48.01 18.0 84.5 0 0 0 0 0.0 3 1.8 0  Non-Supine 111.00 51.99 36.00 75.00 0.00 0.00 1.00 6.00 3.78 13.00 10.81 6.00  Left: 111.0 51.99 36.0 75.0 0 0 1 6 3.8 13 10.8 6     Snoring: Total number of snoring episodes  0  Total time with snoring    min (   % of sleep)   Oximetry Distribution:             WK REM NREM TOTAL   Average (%)   99 99 99 99  < 90% 0.6 0.0 0.0 0.6  < 80% 0.5 0.0 0.0 0.5  < 70% 0.5 0.0 0.0 0.5  # of Desaturations* 0 0 0 0  Desat Index (#/hour) 0.0 0.0 0.0 0.0  Desat Max (%) 0 0 0 0  Desat Max Dur (sec) 0.0 0.0 0.0 0.0  Approx Min O2 during sleep 96  Approx min O2 during a respiratory event 96  Was Oxygen added (Y/N) and final rate No:   0 LPM  *Desaturations based on 4% or greater drop from baseline.   Cheyne Stokes Breathing: None Present    Heart Rate Summary:  Average Heart Rate During Sleep 53.4 bpm      Highest Heart Rate During Sleep (95th %) 103.0 bpm  Highest Heart Rate During Sleep 163 bpm (artifact)  Highest Heart Rate During Recording (TIB) 166 bpm (artifact)   Heart Rate Observations: Event Type # Events   Bradycardia 0 Lowest HR Scored: N/A  Sinus Tachycardia During Sleep 0 Highest HR Scored: N/A  Narrow Complex Tachycardia 0 Highest HR Scored: N/A  Wide Complex Tachycardia 0 Highest HR Scored: N/A  Asystole 0 Longest Pause: N/A  Atrial Fibrillation 0 Duration Longest Event: N/A  Other Arrythmias  No Type:   Periodic Limb Movement Data: (Primary legs unless otherwise noted) Total # Limb Movement 226 Limb Movement Index 63.5  Total # PLMS 187 PLMS Index 52.6  Total # PLMS Arousals 25 PLMS Arousal Index 7.0  Percentage Sleep Time with PLMS 77.54mn (36.5 % sleep)  Mean Duration limb movements (secs) 359.5    IPAP Level (cmH2O) EPAP Level (cmH2O) Total Duration (min) Sleep Duration (min) Sleep (%) REM (%) CA  #) OA # MA # HYP #) AHI (#/hr) RERAs # RERAs (#/hr) RDI (#/hr)  5 5 91.8 40.0 43.6 0.0 0 0 0 3 4.5 10 15.0 19.'5  7 7 '$ 29.9 29.9 100.0 15.1 0 0 1 2 6.0 0 0.0 6.0  9 9 35.6 35.6 100.0 88.5 0 0 0 0 0.0 3 5.1 5.'1  10 10 '$ 178.6 107.1 60.0 10.1 0 0 0 0 0.0 3 1.7 1.7

## 2022-04-11 ENCOUNTER — Ambulatory Visit: Payer: PRIVATE HEALTH INSURANCE | Admitting: Physician Assistant

## 2022-04-18 ENCOUNTER — Encounter: Payer: Self-pay | Admitting: Internal Medicine

## 2022-04-18 ENCOUNTER — Ambulatory Visit (INDEPENDENT_AMBULATORY_CARE_PROVIDER_SITE_OTHER): Payer: PRIVATE HEALTH INSURANCE | Admitting: Internal Medicine

## 2022-04-18 VITALS — BP 132/77 | HR 85 | Temp 97.6°F | Resp 16 | Ht <= 58 in

## 2022-04-18 DIAGNOSIS — Z7189 Other specified counseling: Secondary | ICD-10-CM

## 2022-04-18 DIAGNOSIS — G4733 Obstructive sleep apnea (adult) (pediatric): Secondary | ICD-10-CM | POA: Diagnosis not present

## 2022-04-18 NOTE — Progress Notes (Signed)
[facility] [place], [place] [address]  Pulmonary Sleep Medicine   Office Visit Note  Patient Name: Christina Stephens DOB: 08-11-1975 MRN [id number]  Date of Service: 04/18/2022  Complaints/HPI: She had issues with her CPAP apparently it is not working properly. It is at end of life. Patient states she has been not sleeping well as she had been. Her current sleep study shows that she needs a pressure of 10CWP. She gets her supplies for [facility]  ROS  General: (-) fever, (-) chills, (-) night sweats, (-) weakness Skin: (-) rashes, (-) itching,. Eyes: (-) visual changes, (-) redness, (-) itching. Nose and Sinuses: (-) nasal stuffiness or itchiness, (-) postnasal drip, (-) nosebleeds, (-) sinus trouble. Mouth and Throat: (-) sore throat, (-) hoarseness. Neck: (-) swollen glands, (-) enlarged thyroid, (-) neck pain. Respiratory: - cough, (-) bloody sputum, - shortness of breath, - wheezing. Cardiovascular: - ankle swelling, (-) chest pain. Lymphatic: (-) lymph node enlargement. Neurologic: (-) numbness, (-) tingling. Psychiatric: (-) anxiety, (-) depression   Current Medication: Outpatient Encounter Medications as of 04/18/2022  Medication Sig Note   Calcium Carbonate-Vitamin D 600-200 MG-UNIT TABS Take by mouth.    CALCIUM-VITAMIN D PO Take 1 tablet by mouth daily.    levothyroxine (SYNTHROID) 50 MCG tablet Take 1 tablet (50 mcg total) by mouth daily.    metoprolol tartrate (LOPRESSOR) 25 MG tablet TAKE 1 TABLET BY MOUTH TWICE A DAY    phenytoin (DILANTIN) 100 MG ER capsule TAKE 2 CAPSULES (200 MG TOTAL) BY MOUTH 2 (TWO) TIMES DAILY. 01/02/2015: Received from: External Pharmacy   Facility-Administered Encounter Medications as of 04/18/2022  Medication   sodium chloride flush (NS) 0.9 % injection 10 mL   sodium chloride flush (NS) 0.9 % injection 10 mL   sodium chloride flush (NS) 0.9 % injection 10 mL    Surgical History: Past Surgical History:   Procedure Laterality Date   COLONOSCOPY WITH PROPOFOL N/A 11/04/2021   Procedure: COLONOSCOPY WITH PROPOFOL;  Surgeon: Lin Landsman, MD;  Location: [facility] ENDOSCOPY;  Service: Gastroenterology;  Laterality: N/A;   REDUCTION MAMMAPLASTY Bilateral 1993   VP STUNT HEAD SURGERYOther]  2000    Medical History: Past Medical History:  Diagnosis Date   CPAP (continuous positive airway pressure) dependence    Hereditary hemochromatosis ([place])    Hypertension    Hypothyroidism    Seizures ([place])    Sleep apnea    Spina bifida ([place])    Thyroid disease    hypothyroidism    Family History: Family History  Problem Relation Age of Onset   Hypertension Father    CAD Father    Diabetes Sister    Arthritis Sister    Breast cancer Maternal Grandmother    CAD Paternal Grandfather    Ovarian cancer Maternal Aunt     Social History: Social History   Socioeconomic History   Marital status: Single    Spouse name: Not on file   Number of children: Not on file   Years of education: Not on file   Highest education level: Not on file  Occupational History   Not on file  Tobacco Use   Smoking status: Never   Smokeless tobacco: Never  Vaping Use   Vaping Use: Never used  Substance and Sexual Activity   Alcohol use: No   Drug use: No   Sexual activity: Not Currently  Other Topics Concern   Not on file  Social History Narrative   Not  on file   Social Determinants of Health   Financial Resource Strain: Not on file  Food Insecurity: Not on file  Transportation Needs: Not on file  Physical Activity: Not on file  Stress: Not on file  Social Connections: Not on file  Intimate Partner Violence: Not on file    Vital Signs: Blood pressure 132/77, pulse 85, temperature 97.6 F (36.4 C), resp. rate 16, height '4\' 7"'$  (1.397 m), SpO2 99 %.  Examination: General Appearance: The patient is well-developed, well-nourished, and in no distress. Skin: Gross inspection of skin  unremarkable. Head: normocephalic, no gross deformities. Eyes: no gross deformities noted. ENT: ears appear grossly normal no exudates. Neck: Supple. No thyromegaly. No LAD. Respiratory: no rhonchi noted. Cardiovascular: Normal S1 and S2 without murmur or rub. Extremities: No cyanosis. pulses are equal. Neurologic: Alert and oriented. No involuntary movements.  LABS: Recent Results (from the past 2160 hour(s))  Iron and TIBC([facility])     Status: None   Collection Time: 01/19/22  1:24 PM  Result Value Ref Range   Iron 63 28 - 170 ug/dL   TIBC 339 250 - 450 ug/dL   Saturation Ratios 19 10.4 - 31.8 %   UIBC 276 ug/dL    Comment: Performed at [facility], [address] [place], [place] [address]  Ferritin     Status: None   Collection Time: 01/19/22  1:54 PM  Result Value Ref Range   Ferritin 43 11 - 307 ng/mL    Comment: Performed at [facility], [address] [place], [place] [address]  CBC with Differential/Platelet     Status: Abnormal   Collection Time: 01/19/22  1:54 PM  Result Value Ref Range   WBC 6.6 4.0 - 10.5 K/uL   RBC 3.57 (L) 3.87 - 5.11 MIL/uL   Hemoglobin 11.9 (L) 12.0 - 15.0 g/dL   HCT 35.7 (L) 36.0 - 46.0 %   MCV 100.0 80.0 - 100.0 fL   MCH 33.3 26.0 - 34.0 pg   MCHC 33.3 30.0 - 36.0 g/dL   RDW 12.1 11.5 - 15.5 %   Platelets 256 150 - 400 K/uL   nRBC 0.0 0.0 - 0.2 %   Neutrophils Relative % 52 %   Neutro Abs 3.5 1.7 - 7.7 K/uL   Lymphocytes Relative 35 %   Lymphs Abs 2.3 0.7 - 4.0 K/uL   Monocytes Relative 9 %   Monocytes Absolute 0.6 0.1 - 1.0 K/uL   Eosinophils Relative 3 %   Eosinophils Absolute 0.2 0.0 - 0.5 K/uL   Basophils Relative 1 %   Basophils Absolute 0.0 0.0 - 0.1 K/uL   Immature Granulocytes 0 %   Abs Immature Granulocytes 0.01 0.00 - 0.07 K/uL    Comment: Performed at [facility], [address], [place], [place] [address]    Radiology: No results found.  No results found.  No results  found.    Assessment and Plan: Patient Active Problem List   Diagnosis Date Noted   Positive colorectal cancer screening using Cologuard test    Polyp of colon    Encounter for general adult medical examination with abnormal findings 09/25/2019   Encounter for screening mammogram for malignant neoplasm of breast 09/25/2019   Dysuria 10/01/2018   Acquired hypothyroidism 06/12/2018   Routine cervical smear 06/12/2018   Needs flu shot 02/04/2018   Essential hypertension 05/31/2017   Sleep apnea 05/31/2017   Spina bifida ([place]) 05/31/2017   Hemochromatosis 11/28/2014   Word finding  difficulty 02/28/2013   Seizure disorder ([place]) 02/04/2013   Neurogenic bladder 02/04/2013   Left ankle sprain 09/29/2011   Left leg pain 09/29/2011    1. OSA (obstructive sleep apnea) Doing well overall continue to use CPAP patient is going to have the machine - For home use only DME continuous positive airway pressure (CPAP)  2. CPAP use counseling CPAP Counseling: had a lengthy discussion with the patient regarding the importance of PAP therapy in management of the sleep apnea.   General Counseling: I have discussed the findings of the evaluation and examination with Eliannah.  I have also discussed any further diagnostic evaluation thatmay be needed or ordered today. Sissy verbalizes understanding of the findings of todays visit. We also reviewed her medications today and discussed drug interactions and side effects including but not limited excessive drowsiness and altered mental states. We also discussed that there is always a risk not just to her but also people around her. she has been encouraged to call the office with any questions or concerns that should arise related to todays visit.  No orders of the defined types were placed in this encounter.    Time spent: 10  I have personally obtained a history, examined the patient, evaluated laboratory and imaging results, formulated the assessment and  plan and placed orders.    Allyne Gee, MD [facility] Pulmonary and Critical Care Sleep medicine

## 2022-04-18 NOTE — Patient Instructions (Signed)

## 2022-04-19 ENCOUNTER — Inpatient Hospital Stay: Attending: Oncology

## 2022-04-19 DIAGNOSIS — Z452 Encounter for adjustment and management of vascular access device: Secondary | ICD-10-CM | POA: Insufficient documentation

## 2022-04-19 DIAGNOSIS — Z95828 Presence of other vascular implants and grafts: Secondary | ICD-10-CM

## 2022-04-19 MED ORDER — HEPARIN SOD (PORK) LOCK FLUSH 100 UNIT/ML IV SOLN
500.0000 [IU] | Freq: Once | INTRAVENOUS | Status: AC
  Administered 2022-04-19: 500 [IU] via INTRAVENOUS
  Filled 2022-04-19: qty 5

## 2022-04-19 MED ORDER — SODIUM CHLORIDE 0.9% FLUSH
10.0000 mL | Freq: Once | INTRAVENOUS | Status: AC
  Administered 2022-04-19: 10 mL via INTRAVENOUS
  Filled 2022-04-19: qty 10

## 2022-05-25 ENCOUNTER — Ambulatory Visit (INDEPENDENT_AMBULATORY_CARE_PROVIDER_SITE_OTHER): Payer: PRIVATE HEALTH INSURANCE

## 2022-05-25 DIAGNOSIS — G4733 Obstructive sleep apnea (adult) (pediatric): Secondary | ICD-10-CM

## 2022-05-25 NOTE — Progress Notes (Signed)
95 percentile pressure 12.2   95th percentile leak .35    apnea-hypopnea index  14.7 /hr   total days used  >4 hr 89 days  total days used <4 hr 1 days  Total compliance 98.9 percent  She is doing good, but her cpap not working correctly     Pt was seen by [employer]  RRT/RCP  from [employer]

## 2022-05-27 ENCOUNTER — Telehealth: Payer: Self-pay

## 2022-05-27 NOTE — Telephone Encounter (Signed)
Spoke with kelly and ans send message to beth from [facility] that Cpap order is  in the epic

## 2022-05-31 ENCOUNTER — Inpatient Hospital Stay: Attending: Oncology

## 2022-05-31 DIAGNOSIS — Z95828 Presence of other vascular implants and grafts: Secondary | ICD-10-CM

## 2022-05-31 DIAGNOSIS — Z452 Encounter for adjustment and management of vascular access device: Secondary | ICD-10-CM | POA: Insufficient documentation

## 2022-05-31 MED ORDER — HEPARIN SOD (PORK) LOCK FLUSH 100 UNIT/ML IV SOLN
500.0000 [IU] | Freq: Once | INTRAVENOUS | Status: AC
  Administered 2022-05-31: 500 [IU] via INTRAVENOUS
  Filled 2022-05-31: qty 5

## 2022-05-31 MED ORDER — SODIUM CHLORIDE 0.9% FLUSH
10.0000 mL | Freq: Once | INTRAVENOUS | Status: AC
  Administered 2022-05-31: 10 mL via INTRAVENOUS
  Filled 2022-05-31: qty 10

## 2022-07-12 ENCOUNTER — Inpatient Hospital Stay: Attending: Oncology

## 2022-07-12 DIAGNOSIS — I10 Essential (primary) hypertension: Secondary | ICD-10-CM | POA: Diagnosis not present

## 2022-07-12 DIAGNOSIS — G4730 Sleep apnea, unspecified: Secondary | ICD-10-CM | POA: Insufficient documentation

## 2022-07-12 DIAGNOSIS — E039 Hypothyroidism, unspecified: Secondary | ICD-10-CM | POA: Insufficient documentation

## 2022-07-12 DIAGNOSIS — Z79899 Other long term (current) drug therapy: Secondary | ICD-10-CM | POA: Insufficient documentation

## 2022-07-12 DIAGNOSIS — Q059 Spina bifida, unspecified: Secondary | ICD-10-CM | POA: Insufficient documentation

## 2022-07-12 DIAGNOSIS — Z452 Encounter for adjustment and management of vascular access device: Secondary | ICD-10-CM | POA: Insufficient documentation

## 2022-07-12 DIAGNOSIS — Z95828 Presence of other vascular implants and grafts: Secondary | ICD-10-CM

## 2022-07-12 MED ORDER — HEPARIN SOD (PORK) LOCK FLUSH 100 UNIT/ML IV SOLN
500.0000 [IU] | Freq: Once | INTRAVENOUS | Status: AC
  Administered 2022-07-12: 500 [IU] via INTRAVENOUS
  Filled 2022-07-12: qty 5

## 2022-07-12 MED ORDER — SODIUM CHLORIDE 0.9% FLUSH
10.0000 mL | Freq: Once | INTRAVENOUS | Status: AC
  Administered 2022-07-12: 10 mL via INTRAVENOUS
  Filled 2022-07-12: qty 10

## 2022-07-21 ENCOUNTER — Telehealth: Payer: Self-pay

## 2022-07-21 NOTE — Telephone Encounter (Signed)
Spoke with kelly from [facility] they delivered her Cpap today

## 2022-07-26 ENCOUNTER — Inpatient Hospital Stay (HOSPITAL_BASED_OUTPATIENT_CLINIC_OR_DEPARTMENT_OTHER): Admitting: Oncology

## 2022-07-26 ENCOUNTER — Inpatient Hospital Stay

## 2022-07-26 ENCOUNTER — Encounter: Payer: Self-pay | Admitting: Oncology

## 2022-07-26 ENCOUNTER — Other Ambulatory Visit

## 2022-07-26 DIAGNOSIS — Z452 Encounter for adjustment and management of vascular access device: Secondary | ICD-10-CM | POA: Diagnosis not present

## 2022-07-26 LAB — CBC WITH DIFFERENTIAL/PLATELET
Abs Immature Granulocytes: 0.01 10*3/uL (ref 0.00–0.07)
Basophils Absolute: 0 10*3/uL (ref 0.0–0.1)
Basophils Relative: 1 %
Eosinophils Absolute: 0.1 10*3/uL (ref 0.0–0.5)
Eosinophils Relative: 2 %
HCT: 35.4 % — ABNORMAL LOW (ref 36.0–46.0)
Hemoglobin: 11.9 g/dL — ABNORMAL LOW (ref 12.0–15.0)
Immature Granulocytes: 0 %
Lymphocytes Relative: 34 %
Lymphs Abs: 2.1 10*3/uL (ref 0.7–4.0)
MCH: 33.1 pg (ref 26.0–34.0)
MCHC: 33.6 g/dL (ref 30.0–36.0)
MCV: 98.6 fL (ref 80.0–100.0)
Monocytes Absolute: 0.6 10*3/uL (ref 0.1–1.0)
Monocytes Relative: 9 %
Neutro Abs: 3.4 10*3/uL (ref 1.7–7.7)
Neutrophils Relative %: 54 %
Platelets: 288 10*3/uL (ref 150–400)
RBC: 3.59 MIL/uL — ABNORMAL LOW (ref 3.87–5.11)
RDW: 12.6 % (ref 11.5–15.5)
WBC: 6.3 10*3/uL (ref 4.0–10.5)
nRBC: 0 % (ref 0.0–0.2)

## 2022-07-26 LAB — IRON AND TIBC
Iron: 182 ug/dL — ABNORMAL HIGH (ref 28–170)
Saturation Ratios: 90 % — ABNORMAL HIGH (ref 10.4–31.8)
TIBC: 203 ug/dL — ABNORMAL LOW (ref 250–450)
UIBC: 21 ug/dL

## 2022-07-26 LAB — FERRITIN: Ferritin: 43 ng/mL (ref 11–307)

## 2022-07-26 MED ORDER — SODIUM CHLORIDE 0.9% FLUSH
10.0000 mL | Freq: Once | INTRAVENOUS | Status: AC
  Administered 2022-07-26: 10 mL via INTRAVENOUS
  Filled 2022-07-26: qty 10

## 2022-07-26 MED ORDER — HEPARIN SOD (PORK) LOCK FLUSH 100 UNIT/ML IV SOLN
500.0000 [IU] | Freq: Once | INTRAVENOUS | Status: AC
  Administered 2022-07-26: 500 [IU] via INTRAVENOUS
  Filled 2022-07-26: qty 5

## 2022-07-26 NOTE — Progress Notes (Signed)
[place]  [phone] [phone]  ID: [place] Senate OB: 1976/01/04  MR#: [id number]  [id number]  Patient Care Team: Carolynne Edouard as PCP - General (Physician Assistant) Lloyd Huger, MD as Consulting Physician (Oncology)  CHIEF COMPLAINT: Hereditary hemochromatosis.  INTERVAL HISTORY: Patient returns to clinic today for repeat laboratory work, further evaluation, and continuation of phlebotomy.  She continues to feel well and remains asymptomatic.  She has no new neurologic complaints.  She denies any recent fevers or illnesses.  She has a good appetite and denies weight loss.  She denies any chest pain, shortness of breath, cough, or hemoptysis.  She denies any nausea, vomiting, constipation, or diarrhea.  She has no urinary complaints.  Patient offers no specific complaints today.  REVIEW OF SYSTEMS:   Review of Systems  Constitutional: Negative.  Negative for fever, malaise/fatigue and weight loss.  Respiratory: Negative.  Negative for cough, hemoptysis and shortness of breath.   Cardiovascular: Negative.  Negative for chest pain and leg swelling.  Gastrointestinal: Negative.  Negative for abdominal pain.  Genitourinary: Negative.  Negative for dysuria.  Musculoskeletal: Negative.  Negative for back pain.  Skin: Negative.  Negative for rash.  Neurological: Negative.  Negative for dizziness, focal weakness and weakness.  Psychiatric/Behavioral: Negative.  The patient is not nervous/anxious.     As per HPI. Otherwise, a complete review of systems is negative.  PAST MEDICAL HISTORY: Past Medical History:  Diagnosis Date   CPAP (continuous positive airway pressure) dependence    Hereditary hemochromatosis ([place])    Hypertension    Hypothyroidism    Seizures ([place])    Sleep apnea    Spina bifida ([place])    Thyroid disease    hypothyroidism    PAST SURGICAL HISTORY: Past Surgical History:  Procedure Laterality  Date   COLONOSCOPY WITH PROPOFOL N/A 11/04/2021   Procedure: COLONOSCOPY WITH PROPOFOL;  Surgeon: Lin Landsman, MD;  Location: [facility] ENDOSCOPY;  Service: Gastroenterology;  Laterality: N/A;   REDUCTION MAMMAPLASTY Bilateral 1993   VP STUNT HEAD SURGERYOther]  2000    FAMILY HISTORY: Family History  Problem Relation Age of Onset   Hypertension Father    CAD Father    Diabetes Sister    Arthritis Sister    Breast cancer Maternal Grandmother    CAD Paternal Grandfather    Ovarian cancer Maternal Aunt     ADVANCED DIRECTIVES (Y/N):  N  HEALTH MAINTENANCE: Social History   Tobacco Use   Smoking status: Never   Smokeless tobacco: Never  Vaping Use   Vaping Use: Never used  Substance Use Topics   Alcohol use: No   Drug use: No     Colonoscopy:  PAP:  Bone density:  Lipid panel:  Allergies  Allergen Reactions   Latex     Pt has Spina bifada and was told that if she has surgery she should avoid latex for poss. Reaction/infection.    Current Outpatient Medications  Medication Sig Dispense Refill   Calcium Carbonate-Vitamin D 600-200 MG-UNIT TABS Take by mouth.     CALCIUM-VITAMIN D PO Take 1 tablet by mouth daily.     levothyroxine (SYNTHROID) 50 MCG tablet Take 1 tablet (50 mcg total) by mouth daily. 90 tablet 1   metoprolol tartrate (LOPRESSOR) 25 MG tablet TAKE 1 TABLET BY MOUTH TWICE A DAY 180 tablet 1   phenytoin (DILANTIN) 100 MG ER capsule TAKE 2 CAPSULES (200 MG TOTAL) BY MOUTH 2 (TWO) TIMES DAILY.  11   No current facility-administered medications for this visit.   Facility-Administered Medications Ordered in Other Visits  Medication Dose Route Frequency Provider Last Rate Last Admin   sodium chloride flush (NS) 0.9 % injection 10 mL  10 mL Intravenous PRN Charlaine Dalton R, MD   10 mL at 02/26/16 0917   sodium chloride flush (NS) 0.9 % injection 10 mL  10 mL Intravenous PRN Nolon Stalls C, MD   10 mL at 01/01/18 1108   sodium chloride flush  (NS) 0.9 % injection 10 mL  10 mL Intravenous PRN Lloyd Huger, MD   10 mL at 04/22/20 1100    OBJECTIVE: Vitals:   07/26/22 1321  BP: (!) 144/88  Pulse: 74  Resp: 16  Temp: (!) 96.6 F (35.9 C)  SpO2: 100%     Body mass index is 25.57 kg/m.    ECOG FS:0 - Asymptomatic  General: Well-developed, well-nourished, no acute distress. Eyes: Pink conjunctiva, anicteric sclera. HEENT: Normocephalic, moist mucous membranes. Lungs: No audible wheezing or coughing. Heart: Regular rate and rhythm. Abdomen: Soft, nontender, no obvious distention. Musculoskeletal: No edema, cyanosis, or clubbing. Neuro: Alert, answering all questions appropriately. Cranial nerves grossly intact. Skin: No rashes or petechiae noted. Psych: Normal affect.  LAB RESULTS:  Lab Results  Component Value Date   NA 135 07/23/2021   K 3.9 07/23/2021   CL 98 07/23/2021   CO2 30 07/23/2021   GLUCOSE 102 (H) 07/23/2021   BUN 18 07/23/2021   CREATININE 0.51 07/23/2021   CALCIUM 8.7 (L) 07/23/2021   PROT 7.3 07/23/2021   ALBUMIN 4.0 07/23/2021   AST 24 07/23/2021   ALT 21 07/23/2021   ALKPHOS 61 07/23/2021   BILITOT 0.2 (L) 07/23/2021   GFRNONAA >60 07/23/2021   GFRAA 126 10/21/2019    Lab Results  Component Value Date   WBC 6.3 07/26/2022   NEUTROABS 3.4 07/26/2022   HGB 11.9 (L) 07/26/2022   HCT 35.4 (L) 07/26/2022   MCV 98.6 07/26/2022   PLT 288 07/26/2022   Lab Results  Component Value Date   IRON 182 (H) 07/26/2022   TIBC 203 (L) 07/26/2022   IRONPCTSAT 90 (H) 07/26/2022   Lab Results  Component Value Date   FERRITIN 43 07/26/2022     STUDIES: No results found.  ASSESSMENT: Hereditary hemochromatosis.   PLAN:    Hereditary hemochromatosis: Originally diagnosed in July 2011 with homozygous [id number] mutation.  Previously, patient had a difficult time with phlebotomy and was initiated on Exjade for iron chelation.  She previously was taking 250 mg daily from December 2014 through  September 2020.  AFP is normal and unchanged at 2.4.  Her most recent abdominal ultrasound on June 20, 2018 did not reveal any suspicious abnormalities in her liver.  Despite discontinuing Exjade, patient's iron saturation, total iron, and ferritin remain essentially unchanged.  Patient does not require phlebotomy today.  Return to clinic in 1 year for repeat laboratory work, further evaluation, and consideration of phlebotomy.  If her laboratory work remained stable, can consider discharging from clinic.   Port maintenance: Continue port flushes every 6 to 8 weeks. Spina bifida: Chronic and unchanged.   I spent a total of 20 minutes reviewing chart data, face-to-face evaluation with the patient, counseling and coordination of care as detailed above.   Patient expressed understanding and was in agreement with this plan. She also understands that She can call clinic at any time with any questions, concerns, or complaints.  Lloyd Huger, MD   07/26/2022 3:25 PM

## 2022-07-28 LAB — AFP TUMOR MARKER: AFP, Serum, Tumor Marker: 2.5 ng/mL (ref 0.0–6.4)

## 2022-08-07 ENCOUNTER — Other Ambulatory Visit: Payer: Self-pay | Admitting: Physician Assistant

## 2022-08-07 DIAGNOSIS — I10 Essential (primary) hypertension: Secondary | ICD-10-CM

## 2022-08-29 ENCOUNTER — Other Ambulatory Visit: Payer: Self-pay | Admitting: Physician Assistant

## 2022-08-29 DIAGNOSIS — E039 Hypothyroidism, unspecified: Secondary | ICD-10-CM

## 2022-09-06 ENCOUNTER — Inpatient Hospital Stay: Attending: Oncology

## 2022-09-06 DIAGNOSIS — Z95828 Presence of other vascular implants and grafts: Secondary | ICD-10-CM

## 2022-09-06 DIAGNOSIS — Z452 Encounter for adjustment and management of vascular access device: Secondary | ICD-10-CM | POA: Diagnosis present

## 2022-09-06 MED ORDER — SODIUM CHLORIDE 0.9% FLUSH
10.0000 mL | Freq: Once | INTRAVENOUS | Status: AC
  Administered 2022-09-06: 10 mL via INTRAVENOUS
  Filled 2022-09-06: qty 10

## 2022-09-06 MED ORDER — HEPARIN SOD (PORK) LOCK FLUSH 100 UNIT/ML IV SOLN
500.0000 [IU] | Freq: Once | INTRAVENOUS | Status: AC
  Filled 2022-09-06: qty 5

## 2022-09-06 MED ORDER — HEPARIN SOD (PORK) LOCK FLUSH 100 UNIT/ML IV SOLN
INTRAVENOUS | Status: AC
  Administered 2022-09-06: 500 [IU] via INTRAVENOUS
  Filled 2022-09-06: qty 5

## 2022-09-08 ENCOUNTER — Encounter: Payer: PRIVATE HEALTH INSURANCE | Admitting: Physician Assistant

## 2022-09-26 ENCOUNTER — Encounter: Payer: Self-pay | Admitting: Physician Assistant

## 2022-09-26 ENCOUNTER — Ambulatory Visit (INDEPENDENT_AMBULATORY_CARE_PROVIDER_SITE_OTHER): Payer: PRIVATE HEALTH INSURANCE | Admitting: Physician Assistant

## 2022-09-26 VITALS — BP 136/82 | HR 73 | Temp 98.3°F | Resp 16 | Ht <= 58 in | Wt 110.0 lb

## 2022-09-26 DIAGNOSIS — E039 Hypothyroidism, unspecified: Secondary | ICD-10-CM

## 2022-09-26 DIAGNOSIS — Z1231 Encounter for screening mammogram for malignant neoplasm of breast: Secondary | ICD-10-CM

## 2022-09-26 DIAGNOSIS — E782 Mixed hyperlipidemia: Secondary | ICD-10-CM

## 2022-09-26 DIAGNOSIS — G4733 Obstructive sleep apnea (adult) (pediatric): Secondary | ICD-10-CM | POA: Diagnosis not present

## 2022-09-26 DIAGNOSIS — I10 Essential (primary) hypertension: Secondary | ICD-10-CM | POA: Diagnosis not present

## 2022-09-26 DIAGNOSIS — Z0001 Encounter for general adult medical examination with abnormal findings: Secondary | ICD-10-CM

## 2022-09-26 NOTE — Progress Notes (Signed)
[facility] [address] [place], [place] [address]  Internal MEDICINE  Office Visit Note  Patient Name: Christina Stephens  [id number]  [id number]  Date of Service: 09/26/2022  Chief Complaint  Patient presents with   Hypertension   Annual Exam     HPI Pt is here for routine health maintenance examination -Due for mammogram -on menstrual cycle now, not due for pap until 2025 since previously normal. Declines urine today due to cycle -Doing well with current medications -Using cpap nightly and is sleeping well with this -Due to recheck thyroid and cholesterol, does see hematology for other labs  Current Medication: Outpatient Encounter Medications as of 09/26/2022  Medication Sig Note   Calcium Carbonate-Vitamin D 600-200 MG-UNIT TABS Take by mouth.    CALCIUM-VITAMIN D PO Take 1 tablet by mouth daily.    levothyroxine (SYNTHROID) 50 MCG tablet TAKE 1 TABLET BY MOUTH EVERY DAY    metoprolol tartrate (LOPRESSOR) 25 MG tablet TAKE 1 TABLET BY MOUTH TWICE A DAY    phenytoin (DILANTIN) 100 MG ER capsule TAKE 2 CAPSULES (200 MG TOTAL) BY MOUTH 2 (TWO) TIMES DAILY. 01/02/2015: Received from: External Pharmacy   Facility-Administered Encounter Medications as of 09/26/2022  Medication   sodium chloride flush (NS) 0.9 % injection 10 mL   sodium chloride flush (NS) 0.9 % injection 10 mL   sodium chloride flush (NS) 0.9 % injection 10 mL    Surgical History: Past Surgical History:  Procedure Laterality Date   COLONOSCOPY WITH PROPOFOL N/A 11/04/2021   Procedure: COLONOSCOPY WITH PROPOFOL;  Surgeon: Toney Reil, MD;  Location: [facility] ENDOSCOPY;  Service: Gastroenterology;  Laterality: N/A;   REDUCTION MAMMAPLASTY Bilateral 1993   VP STUNT HEAD SURGERYOther]  2000    Medical History: Past Medical History:  Diagnosis Date   CPAP (continuous positive airway pressure) dependence    Hereditary hemochromatosis (HCC)    Hypertension    Hypothyroidism    Seizures  (HCC)    Sleep apnea    Spina bifida (HCC)    Thyroid disease    hypothyroidism    Family History: Family History  Problem Relation Age of Onset   Hypertension Father    CAD Father    Diabetes Sister    Arthritis Sister    Breast cancer Maternal Grandmother    CAD Paternal Grandfather    Ovarian cancer Maternal Aunt       Review of Systems  Constitutional:  Negative for chills, diaphoresis and fatigue.  HENT:  Negative for ear pain, postnasal drip and sinus pressure.   Eyes:  Negative for photophobia, discharge, redness, itching and visual disturbance.  Respiratory:  Negative for cough, shortness of breath and wheezing.   Cardiovascular:  Negative for chest pain, palpitations and leg swelling.  Gastrointestinal:  Negative for abdominal pain, constipation, diarrhea, nausea and vomiting.  Genitourinary:  Negative for dysuria and flank pain.  Musculoskeletal:  Negative for arthralgias, back pain, gait problem and neck pain.  Skin:  Negative for color change.  Allergic/Immunologic: Negative for environmental allergies and food allergies.  Neurological:  Negative for dizziness and headaches.  Hematological:  Does not bruise/bleed easily.  Psychiatric/Behavioral:  Negative for agitation, behavioral problems (depression) and hallucinations.      Vital Signs: BP 136/82   Pulse 73   Temp 98.3 F (36.8 C)   Resp 16   Ht 4\' 7"  (1.397 m)   Wt 110 lb (49.9 kg)   SpO2 99%   BMI 25.57 kg/m  Physical Exam Vitals and nursing note reviewed.  Constitutional:      General: She is not in acute distress.    Appearance: She is well-developed. She is not diaphoretic.  HENT:     Head: Normocephalic and atraumatic.     Mouth/Throat:     Pharynx: No oropharyngeal exudate.  Eyes:     Pupils: Pupils are equal, round, and reactive to light.  Neck:     Thyroid: No thyromegaly.     Vascular: No JVD.     Trachea: No tracheal deviation.  Cardiovascular:     Rate and Rhythm: Normal  rate and regular rhythm.     Heart sounds: Normal heart sounds. No murmur heard.    No friction rub. No gallop.  Pulmonary:     Effort: Pulmonary effort is normal. No respiratory distress.     Breath sounds: No wheezing or rales.  Chest:     Chest wall: No tenderness.  Abdominal:     General: Bowel sounds are normal.     Palpations: Abdomen is soft.     Tenderness: There is no abdominal tenderness.  Musculoskeletal:        General: Normal range of motion.     Cervical back: Normal range of motion and neck supple.  Lymphadenopathy:     Cervical: No cervical adenopathy.  Skin:    General: Skin is warm and dry.  Neurological:     Mental Status: She is alert and oriented to person, place, and time.     Cranial Nerves: No cranial nerve deficit.     Gait: Gait abnormal.  Psychiatric:        Behavior: Behavior normal.        Thought Content: Thought content normal.        Judgment: Judgment normal.      LABS: Recent Results (from the past 2160 hour(s))  Iron and TIBC     Status: Abnormal   Collection Time: 07/26/22  1:03 PM  Result Value Ref Range   Iron 182 (H) 28 - 170 ug/dL   TIBC 409 (L) 811 - 914 ug/dL   Saturation Ratios 90 (H) 10.4 - 31.8 %   UIBC 21 ug/dL    Comment: Performed at [facility], [address] [place], [place] [address]  AFP tumor marker     Status: None   Collection Time: 07/26/22  1:03 PM  Result Value Ref Range   AFP, Serum, Tumor Marker 2.5 0.0 - 6.4 ng/mL    Comment: (NOTE) Roche [employer]ostics Electrochemiluminescence Immunoassay (ECLIA) Values obtained with different assay methods or kits cannot be used interchangeably.  Results cannot be interpreted as absolute evidence of the presence or absence of malignant disease. This test is not interpretable in pregnant females. Performed At: [facility] [address] [place], [place] [id number] Jolene Schimke MD QI:[id number]   Ferritin     Status: None   Collection Time:  07/26/22  1:03 PM  Result Value Ref Range   Ferritin 43 11 - 307 ng/mL    Comment: Performed at [facility], [address] [place], [place] [address]  CBC with Differential/Platelet     Status: Abnormal   Collection Time: 07/26/22  1:03 PM  Result Value Ref Range   WBC 6.3 4.0 - 10.5 K/uL   RBC 3.59 (L) 3.87 - 5.11 MIL/uL   Hemoglobin 11.9 (L) 12.0 - 15.0 g/dL   HCT 10.2 (L) 72.5 - 36.6 %   MCV 98.6 80.0 -  100.0 fL   MCH 33.1 26.0 - 34.0 pg   MCHC 33.6 30.0 - 36.0 g/dL   RDW 40.9 81.1 - 91.4 %   Platelets 288 150 - 400 K/uL   nRBC 0.0 0.0 - 0.2 %   Neutrophils Relative % 54 %   Neutro Abs 3.4 1.7 - 7.7 K/uL   Lymphocytes Relative 34 %   Lymphs Abs 2.1 0.7 - 4.0 K/uL   Monocytes Relative 9 %   Monocytes Absolute 0.6 0.1 - 1.0 K/uL   Eosinophils Relative 2 %   Eosinophils Absolute 0.1 0.0 - 0.5 K/uL   Basophils Relative 1 %   Basophils Absolute 0.0 0.0 - 0.1 K/uL   Immature Granulocytes 0 %   Abs Immature Granulocytes 0.01 0.00 - 0.07 K/uL    Comment: Performed at [facility], [address] [place], [place] [address]    .MAMM    Assessment/Plan: 1. Encounter for general adult medical examination with abnormal findings CPE performed, due for mammogram, UTD on other PHM  2. Essential hypertension Stable, continue current medication  3. Acquired hypothyroidism Will update labs and adjust synthroid if indicated - TSH + free T4  4. Mixed hyperlipidemia - Lipid Panel With LDL/HDL Ratio  5. OSA (obstructive sleep apnea) Continue cpap nightly  6. Visit for screening mammogram - MM 3D SCREENING MAMMOGRAM BILATERAL BREAST; Future   General Counseling: Christina Stephens verbalizes understanding of the findings of todays visit and agrees with plan of treatment. I have discussed any further diagnostic evaluation that may be needed or ordered today. We also reviewed her medications today. she has been encouraged to call the office with any questions or concerns  that should arise related to todays visit.    Counseling:    Orders Placed This Encounter  Procedures   MM 3D SCREENING MAMMOGRAM BILATERAL BREAST   Lipid Panel With LDL/HDL Ratio   TSH + free T4    No orders of the defined types were placed in this encounter.   This patient was seen by Lynn Ito, PA-C in collaboration with Dr. Beverely Risen as a part of collaborative care agreement.  Total time spent:35 Minutes  Time spent includes review of chart, medications, test results, and follow up plan with the patient.     Lyndon Code, MD  Internal Medicine

## 2022-10-07 LAB — LIPID PANEL WITH LDL/HDL RATIO
Cholesterol, Total: 154 mg/dL (ref 100–199)
HDL: 60 mg/dL (ref >39)
LDL Chol Calc (NIH): 81 mg/dL (ref 0–99)
LDL/HDL Ratio: 1.4 ratio (ref 0.0–3.2)
Triglycerides: 64 mg/dL (ref 0–149)
VLDL Cholesterol Cal: 13 mg/dL (ref 5–40)

## 2022-10-07 LAB — TSH+FREE T4
Free T4: 1.31 ng/dL (ref 0.82–1.77)
TSH: 0.399 u[IU]/mL — ABNORMAL LOW (ref 0.450–4.500)

## 2022-10-13 ENCOUNTER — Telehealth: Payer: Self-pay

## 2022-10-13 NOTE — Telephone Encounter (Signed)
-----   Message from Carlean Jews, PA-C sent at 10/12/2022 12:55 PM EDT ----- Please let her know that her cholesterol looks good, Tsh still a little low but T4 stable. Will continue to monitor

## 2022-10-13 NOTE — Telephone Encounter (Signed)
Spoke with patient regarding lab results. 

## 2022-10-17 ENCOUNTER — Telehealth: Payer: Self-pay | Admitting: Physician Assistant

## 2022-10-17 ENCOUNTER — Encounter

## 2022-10-17 ENCOUNTER — Encounter: Payer: Self-pay | Admitting: Nurse Practitioner

## 2022-10-17 ENCOUNTER — Ambulatory Visit (INDEPENDENT_AMBULATORY_CARE_PROVIDER_SITE_OTHER): Payer: PRIVATE HEALTH INSURANCE | Admitting: Nurse Practitioner

## 2022-10-17 VITALS — BP 141/73 | HR 60 | Temp 98.6°F | Resp 16 | Ht <= 58 in | Wt 110.0 lb

## 2022-10-17 DIAGNOSIS — G4733 Obstructive sleep apnea (adult) (pediatric): Secondary | ICD-10-CM

## 2022-10-17 DIAGNOSIS — Z7189 Other specified counseling: Secondary | ICD-10-CM | POA: Diagnosis not present

## 2022-10-17 NOTE — Telephone Encounter (Signed)
Notified Beth & Sarah with [facility] of supply order-Toni 

## 2022-10-17 NOTE — Progress Notes (Signed)
[facility] [address] [place], [place] [address]  Internal MEDICINE  Office Visit Note  Patient Name: Christina Stephens  [id number]  [id number]  Date of Service: 10/17/2022  Chief Complaint  Patient presents with   Sleep Apnea    Follow up and CPAP download review    HPI Christina Stephens presents for a follow-up visit for OSA and CPAP Has a new machine, working much better, ws not sent the correct size supplies and is waiting for new supplies to be sent.   CPAP download: 95 percentile pressure 12.2 95th percentile leak .35 apnea-hypopnea index  14.7 /hr total days used  >4 hr 89 days total days used <4 hr 1 days Total compliance 98.9 percent   She is doing good, but her cpap not working correctly  Pt was seen by Tresa Endo  RRT/RCP  from [facility]     Current Medication: Outpatient Encounter Medications as of 10/17/2022  Medication Sig Note   Calcium Carbonate-Vitamin D 600-200 MG-UNIT TABS Take by mouth.    CALCIUM-VITAMIN D PO Take 1 tablet by mouth daily.    levothyroxine (SYNTHROID) 50 MCG tablet TAKE 1 TABLET BY MOUTH EVERY DAY    metoprolol tartrate (LOPRESSOR) 25 MG tablet TAKE 1 TABLET BY MOUTH TWICE A DAY    phenytoin (DILANTIN) 100 MG ER capsule TAKE 2 CAPSULES (200 MG TOTAL) BY MOUTH 2 (TWO) TIMES DAILY. 01/02/2015: Received from: External Pharmacy   Facility-Administered Encounter Medications as of 10/17/2022  Medication   sodium chloride flush (NS) 0.9 % injection 10 mL   sodium chloride flush (NS) 0.9 % injection 10 mL   sodium chloride flush (NS) 0.9 % injection 10 mL    Surgical History: Past Surgical History:  Procedure Laterality Date   COLONOSCOPY WITH PROPOFOL N/A 11/04/2021   Procedure: COLONOSCOPY WITH PROPOFOL;  Surgeon: Toney Reil, MD;  Location: [facility] END[facility]COPY;  Service: Gastroenterology;  Laterality: N/A;   REDUCTION MAMMAPLASTY Bilateral 1993   VP STUNT HEAD SURGERYOther]  2000    Medical History: Past Medical History:  Diagnosis  Date   CPAP (continuous positive airway pressure) dependence    Hereditary hemochromatosis (HCC)    Hypertension    Hypothyroidism    Seizures (HCC)    Sleep apnea    Spina bifida (HCC)    Thyroid disease    hypothyroidism    Family History: Family History  Problem Relation Age of Onset   Hypertension Father    CAD Father    Diabetes Sister    Arthritis Sister    Breast cancer Maternal Grandmother    CAD Paternal Grandfather    Ovarian cancer Maternal Aunt     Social History   Socioeconomic History   Marital status: Single    Spouse name: Not on file   Number of children: Not on file   Years of education: Not on file   Highest education level: Not on file  Occupational History   Not on file  Tobacco Use   Smoking status: Never   Smokeless tobacco: Never  Vaping Use   Vaping Use: Never used  Substance and Sexual Activity   Alcohol use: No   Drug use: No   Sexual activity: Not Currently  Other Topics Concern   Not on file  Social History Narrative   Not on file   Social Determinants of Health   Financial Resource Strain: Not on file  Food Insecurity: Not on file  Transportation Needs: Not on file  Physical Activity: Not  on file  Stress: Not on file  Social Connections: Not on file  Intimate Partner Violence: Not on file      Review of Systems  Constitutional: Negative.  Negative for chills, fatigue and fever.  HENT: Negative.  Negative for congestion, ear discharge, postnasal drip, rhinorrhea, sinus pressure, sinus pain, sneezing and sore throat.   Respiratory: Negative.  Negative for cough, chest tightness, shortness of breath and wheezing.   Cardiovascular: Negative.  Negative for chest pain and palpitations.  Gastrointestinal: Negative.   Neurological: Negative.     Vital Signs: BP (!) 141/73   Pulse 60   Temp 98.6 F (37 C)   Resp 16   Ht 4\' 7"  (1.397 m)   Wt 110 lb (49.9 kg) Comment: At home  SpO2 96%   BMI 25.57 kg/m    Physical  Exam Vitals reviewed.  Constitutional:      General: She is not in acute distress.    Appearance: Normal appearance. She is not ill-appearing.  HENT:     Head: Normocephalic and atraumatic.  Eyes:     Pupils: Pupils are equal, round, and reactive to light.  Cardiovascular:     Rate and Rhythm: Normal rate and regular rhythm.  Pulmonary:     Effort: Pulmonary effort is normal. No respiratory distress.  Neurological:     Mental Status: She is alert and oriented to person, place, and time.  Psychiatric:        Mood and Affect: Mood normal.        Behavior: Behavior normal.        Assessment/Plan: 1. OSA (obstructive sleep apnea) Supplies ordered, continue using CPAP as instructed, follow up in 1 year.  - For home use only DME continuous positive airway pressure (CPAP)  2. CPAP use counseling Supplies ordered, no questions or concerns regarding maintenance or use of CPAP machine - For home use only DME continuous positive airway pressure (CPAP)   General Counseling: [name] verbalizes understanding of the findings of todays visit and agrees with plan of treatment. I have discussed any further diagnostic evaluation that may be needed or ordered today. We also reviewed her medications today. she has been encouraged to call the office with any questions or concerns that should arise related to todays visit.    Orders Placed This Encounter  Procedures   For home use only DME continuous positive airway pressure (CPAP)    No orders of the defined types were placed in this encounter.   Return in about 1 year (around 10/17/2023) for F/U, pulmonary/sleep DSK or [name] .   Total time spent:20 Minutes Time spent includes review of chart, medications, test results, and follow up plan with the patient.   [place] Controlled Substance Database was reviewed by me.  This patient was seen by Sallyanne Kuster, FNP-C in collaboration with Dr. Beverely Risen as a part of collaborative care  agreement.   Yoshiaki Kreuser R. Tedd Sias, MSN, FNP-C Internal medicine

## 2022-10-18 ENCOUNTER — Ambulatory Visit
Admission: RE | Admit: 2022-10-18 | Discharge: 2022-10-18 | Disposition: A | Discharge status: Home / Self Care | Source: Ambulatory Visit | Attending: Physician Assistant | Admitting: Physician Assistant

## 2022-10-18 DIAGNOSIS — Z1231 Encounter for screening mammogram for malignant neoplasm of breast: Secondary | ICD-10-CM | POA: Insufficient documentation

## 2022-10-24 ENCOUNTER — Telehealth: Payer: Self-pay | Admitting: Physician Assistant

## 2022-10-24 NOTE — Telephone Encounter (Signed)
Per Beth with [facility], supplies sent to patient 10/20/22-Toni

## 2022-10-25 ENCOUNTER — Inpatient Hospital Stay: Attending: Oncology

## 2022-10-25 DIAGNOSIS — Z95828 Presence of other vascular implants and grafts: Secondary | ICD-10-CM

## 2022-10-25 LAB — IRON AND TIBC
Iron: 175 ug/dL — ABNORMAL HIGH (ref 28–170)
Saturation Ratios: 91 % — ABNORMAL HIGH (ref 10.4–31.8)
TIBC: 192 ug/dL — ABNORMAL LOW (ref 250–450)
UIBC: 17 ug/dL

## 2022-10-25 LAB — CBC WITH DIFFERENTIAL (CANCER CENTER ONLY)
Abs Immature Granulocytes: 0.02 10*3/uL (ref 0.00–0.07)
Basophils Absolute: 0 10*3/uL (ref 0.0–0.1)
Basophils Relative: 1 %
Eosinophils Absolute: 0.2 10*3/uL (ref 0.0–0.5)
Eosinophils Relative: 4 %
HCT: 35.4 % — ABNORMAL LOW (ref 36.0–46.0)
Hemoglobin: 11.8 g/dL — ABNORMAL LOW (ref 12.0–15.0)
Immature Granulocytes: 0 %
Lymphocytes Relative: 37 %
Lymphs Abs: 2 10*3/uL (ref 0.7–4.0)
MCH: 32.9 pg (ref 26.0–34.0)
MCHC: 33.3 g/dL (ref 30.0–36.0)
MCV: 98.6 fL (ref 80.0–100.0)
Monocytes Absolute: 0.6 10*3/uL (ref 0.1–1.0)
Monocytes Relative: 11 %
Neutro Abs: 2.6 10*3/uL (ref 1.7–7.7)
Neutrophils Relative %: 47 %
Platelet Count: 224 10*3/uL (ref 150–400)
RBC: 3.59 MIL/uL — ABNORMAL LOW (ref 3.87–5.11)
RDW: 12.2 % (ref 11.5–15.5)
WBC Count: 5.4 10*3/uL (ref 4.0–10.5)
nRBC: 0 % (ref 0.0–0.2)

## 2022-10-25 LAB — FERRITIN: Ferritin: 47 ng/mL (ref 11–307)

## 2022-10-25 MED ORDER — HEPARIN SOD (PORK) LOCK FLUSH 100 UNIT/ML IV SOLN
500.0000 [IU] | Freq: Once | INTRAVENOUS | Status: AC
  Administered 2022-10-25: 500 [IU] via INTRAVENOUS
  Filled 2022-10-25: qty 5

## 2022-10-25 MED ORDER — SODIUM CHLORIDE 0.9% FLUSH
10.0000 mL | Freq: Once | INTRAVENOUS | Status: AC
  Administered 2022-10-25: 10 mL via INTRAVENOUS
  Filled 2022-10-25: qty 10

## 2022-10-26 LAB — AFP TUMOR MARKER: AFP, Serum, Tumor Marker: 2 ng/mL (ref 0.0–6.4)

## 2022-12-13 ENCOUNTER — Inpatient Hospital Stay: Attending: Oncology

## 2022-12-13 DIAGNOSIS — Z95828 Presence of other vascular implants and grafts: Secondary | ICD-10-CM

## 2022-12-13 DIAGNOSIS — Z452 Encounter for adjustment and management of vascular access device: Secondary | ICD-10-CM | POA: Diagnosis not present

## 2022-12-13 MED ORDER — HEPARIN SOD (PORK) LOCK FLUSH 100 UNIT/ML IV SOLN
500.0000 [IU] | Freq: Once | INTRAVENOUS | Status: AC
  Administered 2022-12-13: 500 [IU] via INTRAVENOUS
  Filled 2022-12-13: qty 5

## 2022-12-13 MED ORDER — SODIUM CHLORIDE 0.9% FLUSH
10.0000 mL | INTRAVENOUS | Status: DC | PRN
  Administered 2022-12-13: 10 mL via INTRAVENOUS
  Filled 2022-12-13: qty 10

## 2022-12-13 NOTE — Patient Instructions (Signed)

## 2023-01-16 ENCOUNTER — Telehealth: Payer: Self-pay

## 2023-01-16 NOTE — Telephone Encounter (Signed)
Patient called stated she was getting her braces fix. Patient was advised to call [employer] and tell them to fax [place] what we need to sign so she can get her braces fix.

## 2023-01-31 ENCOUNTER — Inpatient Hospital Stay: Attending: Oncology

## 2023-01-31 DIAGNOSIS — Z452 Encounter for adjustment and management of vascular access device: Secondary | ICD-10-CM | POA: Diagnosis present

## 2023-01-31 DIAGNOSIS — Z95828 Presence of other vascular implants and grafts: Secondary | ICD-10-CM

## 2023-01-31 MED ORDER — HEPARIN SOD (PORK) LOCK FLUSH 100 UNIT/ML IV SOLN
500.0000 [IU] | Freq: Once | INTRAVENOUS | Status: AC
  Administered 2023-01-31: 500 [IU] via INTRAVENOUS
  Filled 2023-01-31: qty 5

## 2023-01-31 MED ORDER — SODIUM CHLORIDE 0.9% FLUSH
10.0000 mL | Freq: Once | INTRAVENOUS | Status: AC
  Administered 2023-01-31: 10 mL via INTRAVENOUS
  Filled 2023-01-31: qty 10

## 2023-02-04 ENCOUNTER — Other Ambulatory Visit: Payer: Self-pay | Admitting: Physician Assistant

## 2023-02-04 DIAGNOSIS — I10 Essential (primary) hypertension: Secondary | ICD-10-CM

## 2023-02-24 ENCOUNTER — Other Ambulatory Visit: Payer: Self-pay | Admitting: Internal Medicine

## 2023-02-24 DIAGNOSIS — E039 Hypothyroidism, unspecified: Secondary | ICD-10-CM

## 2023-03-21 ENCOUNTER — Inpatient Hospital Stay: Attending: Oncology

## 2023-03-21 DIAGNOSIS — Z452 Encounter for adjustment and management of vascular access device: Secondary | ICD-10-CM | POA: Diagnosis present

## 2023-03-21 DIAGNOSIS — Z95828 Presence of other vascular implants and grafts: Secondary | ICD-10-CM

## 2023-03-21 MED ORDER — HEPARIN SOD (PORK) LOCK FLUSH 100 UNIT/ML IV SOLN
500.0000 [IU] | Freq: Once | INTRAVENOUS | Status: AC
  Administered 2023-03-21: 500 [IU] via INTRAVENOUS
  Filled 2023-03-21: qty 5

## 2023-03-21 MED ORDER — SODIUM CHLORIDE 0.9% FLUSH
10.0000 mL | Freq: Once | INTRAVENOUS | Status: AC
  Administered 2023-03-21: 10 mL via INTRAVENOUS
  Filled 2023-03-21: qty 10

## 2023-03-27 ENCOUNTER — Encounter: Payer: Self-pay | Admitting: Physician Assistant

## 2023-03-27 ENCOUNTER — Ambulatory Visit (INDEPENDENT_AMBULATORY_CARE_PROVIDER_SITE_OTHER): Payer: PRIVATE HEALTH INSURANCE | Admitting: Physician Assistant

## 2023-03-27 VITALS — BP 122/75 | HR 76 | Temp 98.4°F | Resp 16 | Ht <= 58 in | Wt 110.0 lb

## 2023-03-27 DIAGNOSIS — E039 Hypothyroidism, unspecified: Secondary | ICD-10-CM | POA: Diagnosis not present

## 2023-03-27 DIAGNOSIS — I10 Essential (primary) hypertension: Secondary | ICD-10-CM | POA: Diagnosis not present

## 2023-03-27 DIAGNOSIS — R5383 Other fatigue: Secondary | ICD-10-CM | POA: Diagnosis not present

## 2023-03-27 NOTE — Progress Notes (Signed)
[facility] [address] [place], [place] [address]  Internal MEDICINE  Office Visit Note  Patient Name: Christina Stephens  [id number]  [id number]  Date of Service: 03/27/2023  Chief Complaint  Patient presents with   Hypertension   Follow-up   Quality Metric Gaps    Flu Vaccine    HPI Pt is here for routine follow up and has no concerns today -Wearing cpap at night -BP stable -Continues to follow with neurology for seizure history -Also follows with hematology for hemochromatosis and has labs regularly -due to thyroid lab recheck and will have this done  -Will be having flu shot  Current Medication: Outpatient Encounter Medications as of 03/27/2023  Medication Sig Note   Calcium Carbonate-Vitamin D 600-200 MG-UNIT TABS Take by mouth.    CALCIUM-VITAMIN D PO Take 1 tablet by mouth daily.    levothyroxine (SYNTHROID) 50 MCG tablet TAKE 1 TABLET BY MOUTH EVERY DAY    metoprolol tartrate (LOPRESSOR) 25 MG tablet TAKE 1 TABLET BY MOUTH TWICE A DAY    phenytoin (DILANTIN) 100 MG ER capsule TAKE 2 CAPSULES (200 MG TOTAL) BY MOUTH 2 (TWO) TIMES DAILY. 01/02/2015: Received from: External Pharmacy   Facility-Administered Encounter Medications as of 03/27/2023  Medication   sodium chloride flush (NS) 0.9 % injection 10 mL   sodium chloride flush (NS) 0.9 % injection 10 mL   sodium chloride flush (NS) 0.9 % injection 10 mL    Surgical History: Past Surgical History:  Procedure Laterality Date   COLONOSCOPY WITH PROPOFOL N/A 11/04/2021   Procedure: COLONOSCOPY WITH PROPOFOL;  Surgeon: Toney Reil, MD;  Location: [facility] ENDOSCOPY;  Service: Gastroenterology;  Laterality: N/A;   REDUCTION MAMMAPLASTY Bilateral 1993   VP STUNT HEAD SURGERYOther]  2000    Medical History: Past Medical History:  Diagnosis Date   CPAP (continuous positive airway pressure) dependence    Hereditary hemochromatosis (HCC)    Hypertension    Hypothyroidism    Seizures (HCC)     Sleep apnea    Spina bifida (HCC)    Thyroid disease    hypothyroidism    Family History: Family History  Problem Relation Age of Onset   Hypertension Father    CAD Father    Diabetes Sister    Arthritis Sister    Breast cancer Maternal Grandmother    CAD Paternal Grandfather    Ovarian cancer Maternal Aunt     Social History   Socioeconomic History   Marital status: Single    Spouse name: Not on file   Number of children: Not on file   Years of education: Not on file   Highest education level: Not on file  Occupational History   Not on file  Tobacco Use   Smoking status: Never   Smokeless tobacco: Never  Vaping Use   Vaping status: Never Used  Substance and Sexual Activity   Alcohol use: No   Drug use: No   Sexual activity: Not Currently  Other Topics Concern   Not on file  Social History Narrative   Not on file   Social Determinants of Health   Financial Resource Strain: Not on file  Food Insecurity: Not on file  Transportation Needs: Not on file  Physical Activity: Not on file  Stress: Not on file  Social Connections: Not on file  Intimate Partner Violence: Not on file      Review of Systems  Constitutional:  Negative for chills, diaphoresis and fatigue.  HENT:  Negative for ear pain, postnasal drip and sinus pressure.   Eyes:  Negative for photophobia, discharge, redness, itching and visual disturbance.  Respiratory:  Negative for cough, shortness of breath and wheezing.   Cardiovascular:  Negative for chest pain, palpitations and leg swelling.  Gastrointestinal:  Negative for abdominal pain, constipation, diarrhea, nausea and vomiting.  Genitourinary:  Negative for dysuria and flank pain.  Musculoskeletal:  Negative for arthralgias, back pain and neck pain.  Skin:  Negative for color change.  Allergic/Immunologic: Negative for environmental allergies and food allergies.  Neurological:  Negative for dizziness and headaches.  Hematological:  Does  not bruise/bleed easily.  Psychiatric/Behavioral:  Negative for agitation, behavioral problems (depression) and hallucinations.     Vital Signs: BP 122/75   Pulse 76   Temp 98.4 F (36.9 C)   Resp 16   Ht 4\' 7"  (1.397 m)   Wt 110 lb (49.9 kg)   SpO2 98%   BMI 25.57 kg/m    Physical Exam Vitals and nursing note reviewed.  Constitutional:      General: She is not in acute distress.    Appearance: She is well-developed. She is not diaphoretic.  HENT:     Head: Normocephalic and atraumatic.     Mouth/Throat:     Pharynx: No oropharyngeal exudate.  Eyes:     Pupils: Pupils are equal, round, and reactive to light.  Neck:     Thyroid: No thyromegaly.     Vascular: No JVD.     Trachea: No tracheal deviation.  Cardiovascular:     Rate and Rhythm: Normal rate and regular rhythm.     Heart sounds: Normal heart sounds. No murmur heard.    No friction rub. No gallop.  Pulmonary:     Effort: Pulmonary effort is normal. No respiratory distress.     Breath sounds: No wheezing or rales.  Chest:     Chest wall: No tenderness.  Abdominal:     General: Bowel sounds are normal.     Palpations: Abdomen is soft.     Tenderness: There is no abdominal tenderness.  Musculoskeletal:        General: Normal range of motion.     Cervical back: Normal range of motion and neck supple.  Lymphadenopathy:     Cervical: No cervical adenopathy.  Skin:    General: Skin is warm and dry.  Neurological:     Mental Status: She is alert and oriented to person, place, and time.     Cranial Nerves: No cranial nerve deficit.     Gait: Gait abnormal.  Psychiatric:        Behavior: Behavior normal.        Thought Content: Thought content normal.        Judgment: Judgment normal.        Assessment/Plan: 1. Essential hypertension Stable, continue current medication  2. Acquired hypothyroidism Will update labs - TSH + free T4 - Comprehensive metabolic panel  3. Other fatigue - Comprehensive  metabolic panel   General Counseling: Christina Stephens verbalizes understanding of the findings of todays visit and agrees with plan of treatment. I have discussed any further diagnostic evaluation that may be needed or ordered today. We also reviewed her medications today. she has been encouraged to call the office with any questions or concerns that should arise related to todays visit.    Orders Placed This Encounter  Procedures   TSH + free T4   Comprehensive metabolic panel    No  orders of the defined types were placed in this encounter.   This patient was seen by Lynn Ito, PA-C in collaboration with Dr. Beverely Risen as a part of collaborative care agreement.   Total time spent:30 Minutes Time spent includes review of chart, medications, test results, and follow up plan with the patient.      Dr Lyndon Code Internal medicine

## 2023-04-03 ENCOUNTER — Other Ambulatory Visit
Admission: RE | Admit: 2023-04-03 | Discharge: 2023-04-03 | Disposition: A | Discharge status: Home / Self Care | Attending: Physician Assistant | Admitting: Physician Assistant

## 2023-04-03 DIAGNOSIS — E039 Hypothyroidism, unspecified: Secondary | ICD-10-CM | POA: Diagnosis not present

## 2023-04-03 DIAGNOSIS — R5383 Other fatigue: Secondary | ICD-10-CM | POA: Insufficient documentation

## 2023-04-03 LAB — COMPREHENSIVE METABOLIC PANEL
ALT: 18 U/L (ref 0–44)
AST: 18 U/L (ref 15–41)
Albumin: 3.9 g/dL (ref 3.5–5.0)
Alkaline Phosphatase: 59 U/L (ref 38–126)
Anion gap: 5 (ref 5–15)
BUN: 19 mg/dL (ref 6–20)
CO2: 28 mmol/L (ref 22–32)
Calcium: 8.9 mg/dL (ref 8.9–10.3)
Chloride: 103 mmol/L (ref 98–111)
Creatinine, Ser: 0.6 mg/dL (ref 0.44–1.00)
GFR, Estimated: >60 mL/min (ref >60)
Glucose, Bld: 95 mg/dL (ref 70–99)
Potassium: 4 mmol/L (ref 3.5–5.1)
Sodium: 136 mmol/L (ref 135–145)
Total Bilirubin: 0.3 mg/dL (ref <1.2)
Total Protein: 7.2 g/dL (ref 6.5–8.1)

## 2023-04-03 LAB — T4, FREE: Free T4: 0.82 ng/dL (ref 0.61–1.12)

## 2023-04-03 LAB — TSH: TSH: 0.522 u[IU]/mL (ref 0.350–4.500)

## 2023-04-04 ENCOUNTER — Telehealth: Payer: Self-pay

## 2023-04-04 NOTE — Telephone Encounter (Signed)
Spoke to patient regarding lab results.

## 2023-04-04 NOTE — Telephone Encounter (Signed)
-----   Message from Carlean Jews sent at 04/04/2023  3:59 PM EST ----- Please let her know that her labs looked good

## 2023-05-09 ENCOUNTER — Inpatient Hospital Stay

## 2023-05-19 ENCOUNTER — Inpatient Hospital Stay

## 2023-06-05 IMAGING — MG MM DIGITAL SCREENING BILAT W/ TOMO AND CAD
6 of 10 series · 6 of 30 positions shown · non-contrast
Comparison: Previous exam(s).

CLINICAL DATA: Screening.

EXAM:
DIGITAL SCREENING BILATERAL MAMMOGRAM WITH TOMOSYNTHESIS AND CAD
TECHNIQUE: Bilateral screening digital craniocaudal and mediolateral oblique
mammograms were obtained. Bilateral screening digital breast
tomosynthesis was performed. The images were evaluated with
computer-aided detection.

[R MLO synth-2D (1 of 2)]
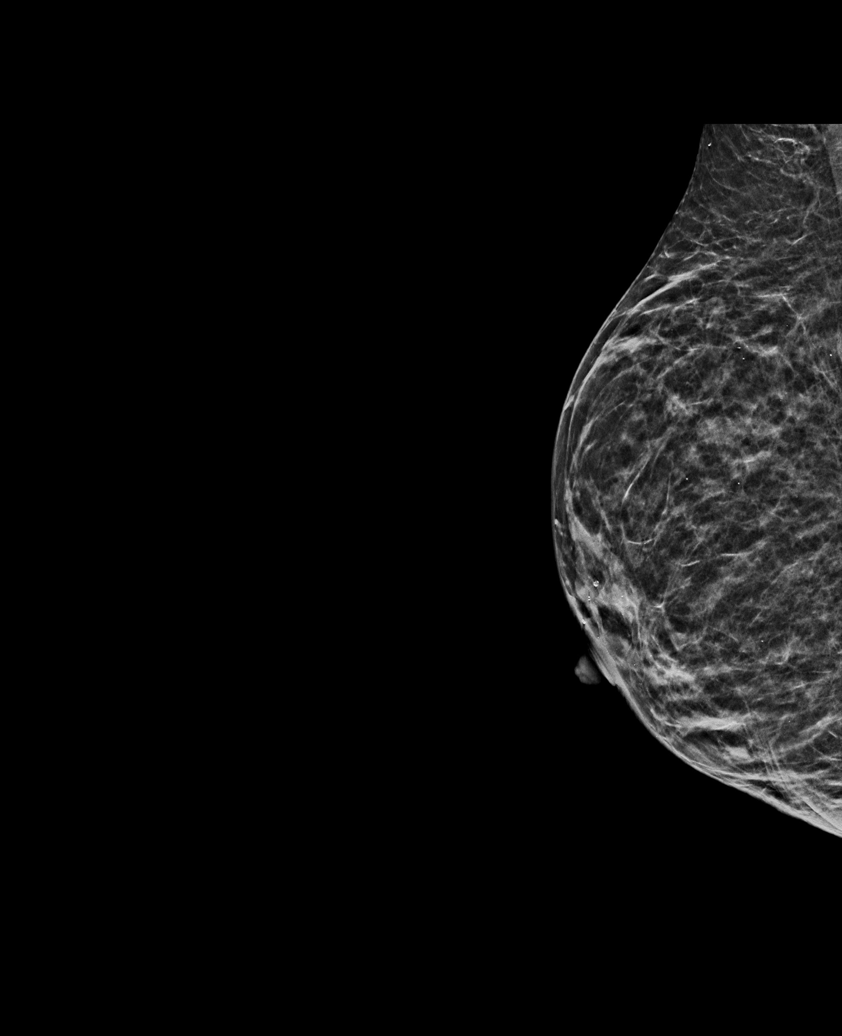

[R CC synth-2D]
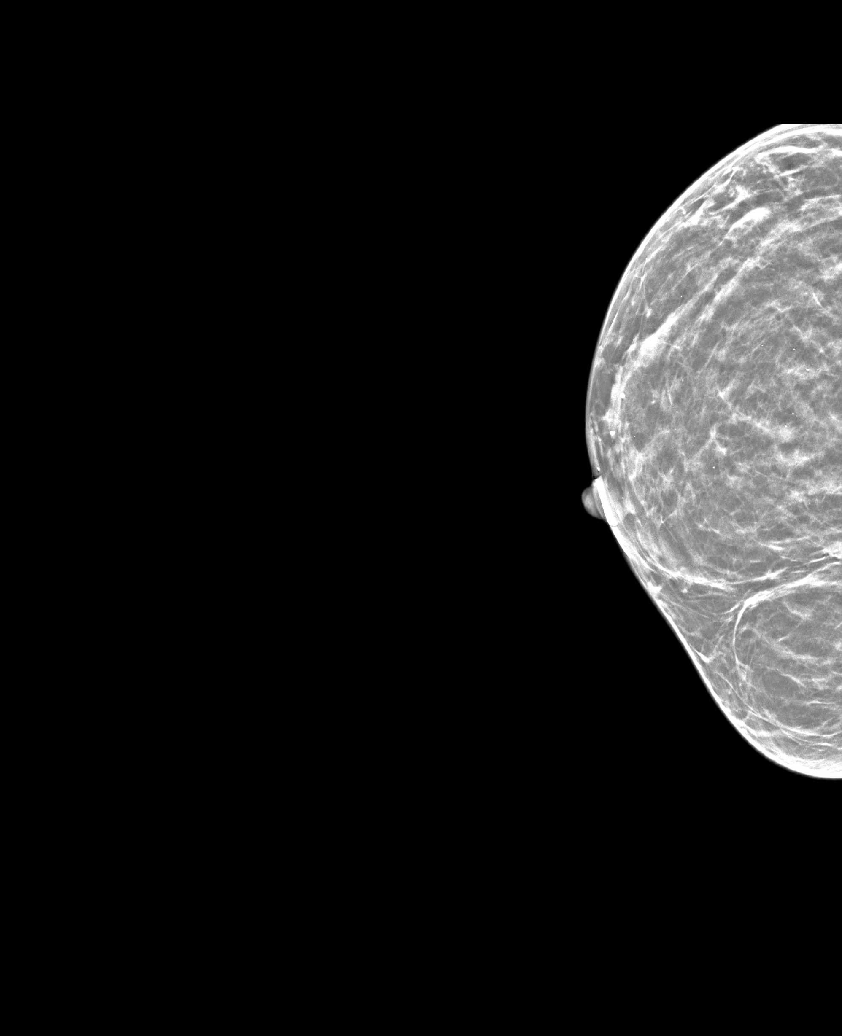

[L MLO synth-2D]
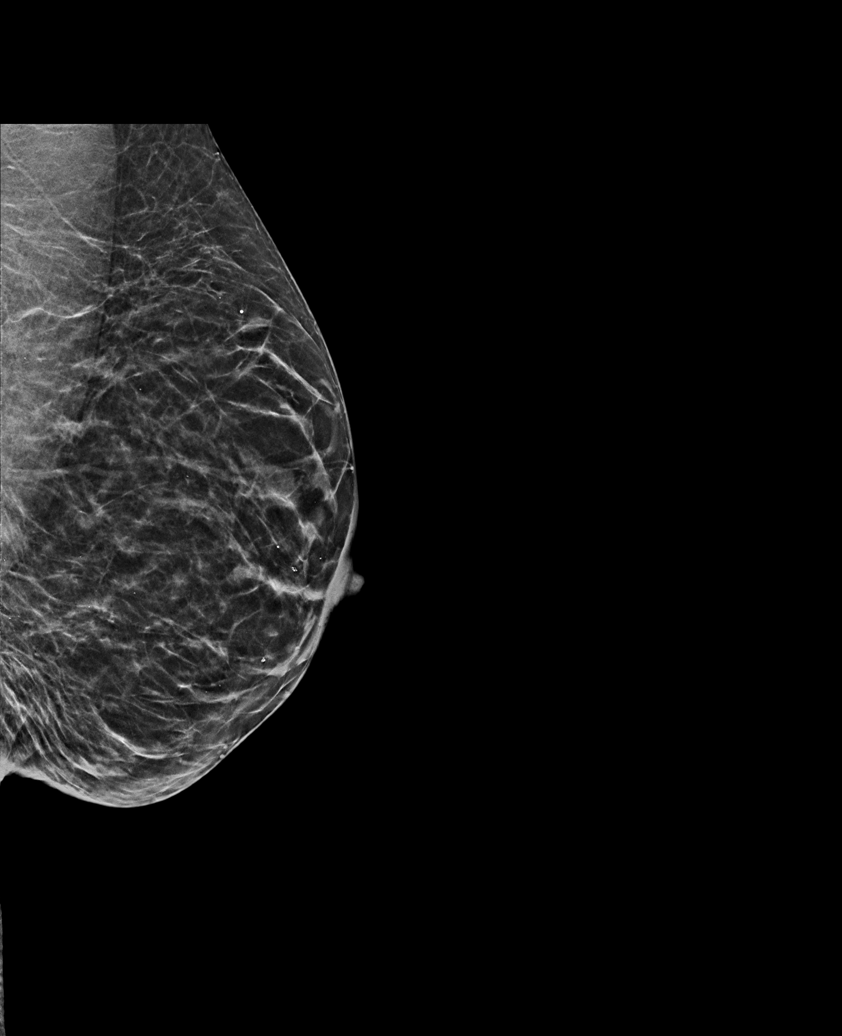

[L CC synth-2D]
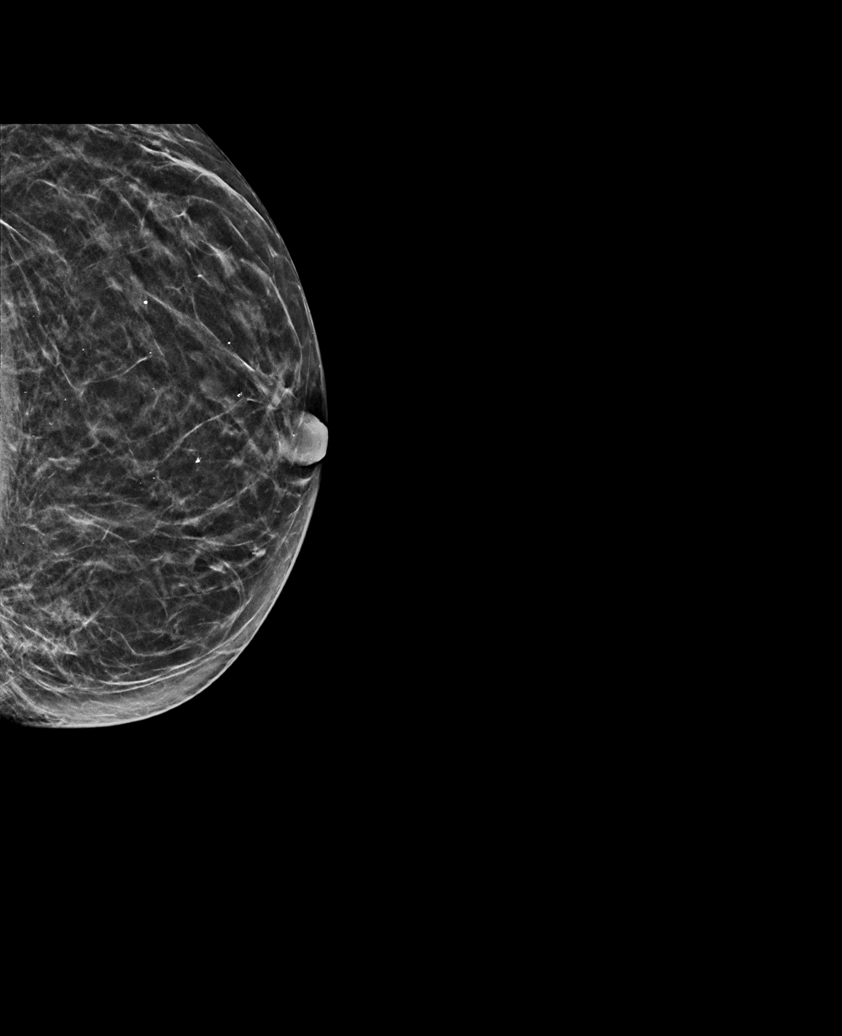

[R MLO synth-2D (2 of 2)]
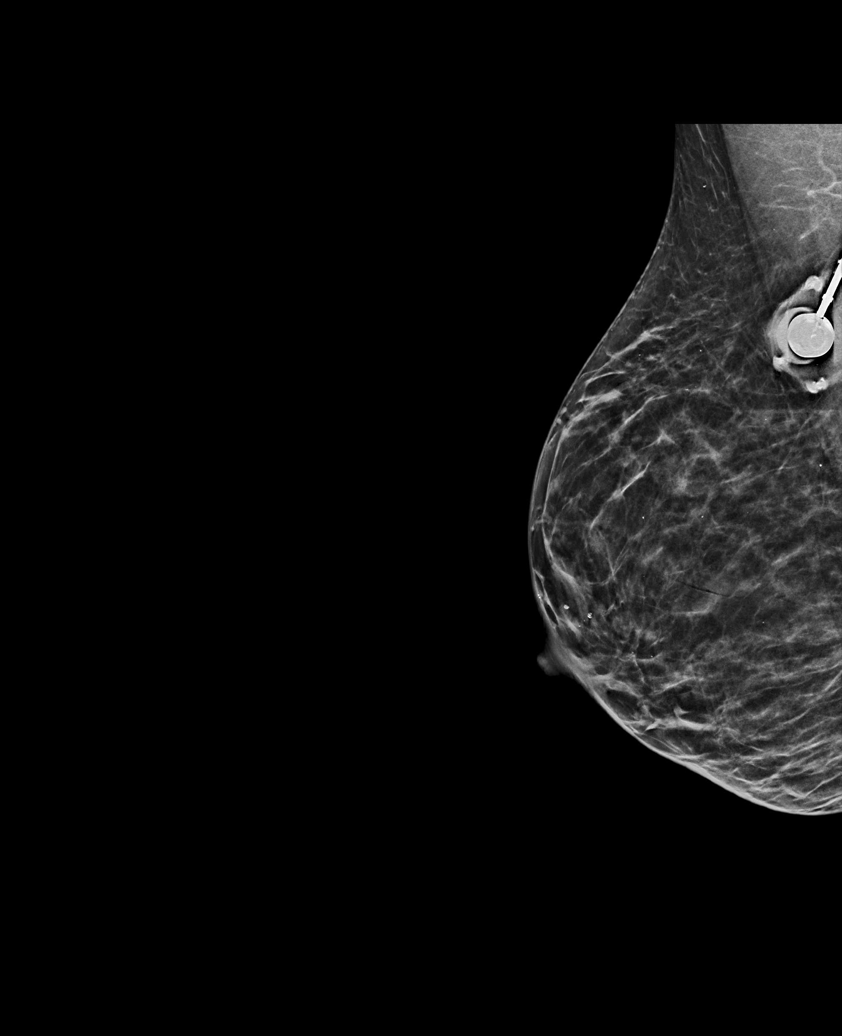

[R MLO tomo · tomo slice 25/50.0]
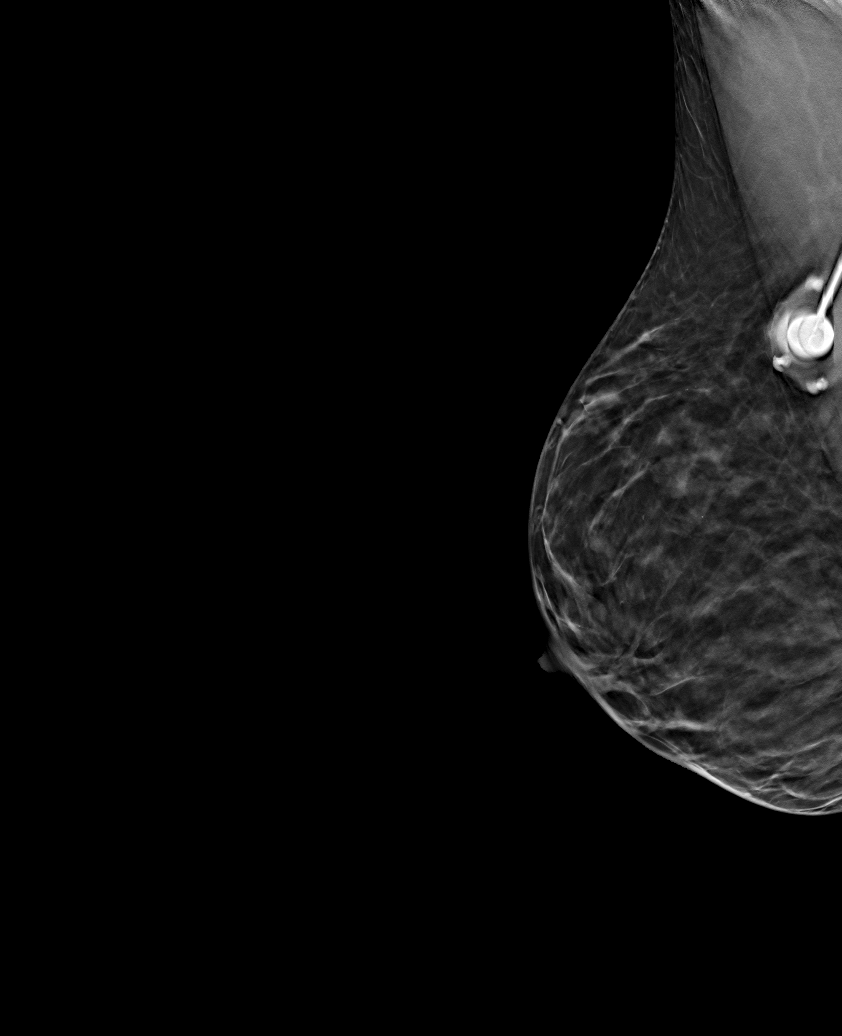

[6 of 30 positions shown; findings below may reference images not displayed]

ACR Breast Density Category b: There are scattered areas of
fibroglandular density.
FINDINGS: There are no findings suspicious for malignancy.
IMPRESSION: No mammographic evidence of malignancy. A result letter of this
screening mammogram will be mailed directly to the patient.

RECOMMENDATION:
Screening mammogram in one year. (Code:51-O-LD2)

BI-RADS CATEGORY  1: Negative.

## 2023-06-27 ENCOUNTER — Inpatient Hospital Stay: Attending: Oncology

## 2023-06-27 DIAGNOSIS — Z95828 Presence of other vascular implants and grafts: Secondary | ICD-10-CM

## 2023-06-27 DIAGNOSIS — Z452 Encounter for adjustment and management of vascular access device: Secondary | ICD-10-CM | POA: Insufficient documentation

## 2023-06-27 MED ORDER — HEPARIN SOD (PORK) LOCK FLUSH 100 UNIT/ML IV SOLN
500.0000 [IU] | Freq: Once | INTRAVENOUS | Status: AC
  Administered 2023-06-27: 500 [IU] via INTRAVENOUS
  Filled 2023-06-27: qty 5

## 2023-06-27 NOTE — Patient Instructions (Signed)

## 2023-07-19 ENCOUNTER — Inpatient Hospital Stay: Attending: Oncology

## 2023-07-19 ENCOUNTER — Other Ambulatory Visit: Payer: Self-pay | Admitting: *Deleted

## 2023-07-19 DIAGNOSIS — Q059 Spina bifida, unspecified: Secondary | ICD-10-CM | POA: Insufficient documentation

## 2023-07-19 DIAGNOSIS — Z95828 Presence of other vascular implants and grafts: Secondary | ICD-10-CM

## 2023-07-19 DIAGNOSIS — Z452 Encounter for adjustment and management of vascular access device: Secondary | ICD-10-CM | POA: Insufficient documentation

## 2023-07-19 LAB — CBC WITH DIFFERENTIAL/PLATELET
Abs Immature Granulocytes: 0.01 10*3/uL (ref 0.00–0.07)
Basophils Absolute: 0 10*3/uL (ref 0.0–0.1)
Basophils Relative: 1 %
Eosinophils Absolute: 0.2 10*3/uL (ref 0.0–0.5)
Eosinophils Relative: 3 %
HCT: 35 % — ABNORMAL LOW (ref 36.0–46.0)
Hemoglobin: 11.9 g/dL — ABNORMAL LOW (ref 12.0–15.0)
Immature Granulocytes: 0 %
Lymphocytes Relative: 35 %
Lymphs Abs: 1.9 10*3/uL (ref 0.7–4.0)
MCH: 33.2 pg (ref 26.0–34.0)
MCHC: 34 g/dL (ref 30.0–36.0)
MCV: 97.8 fL (ref 80.0–100.0)
Monocytes Absolute: 0.5 10*3/uL (ref 0.1–1.0)
Monocytes Relative: 10 %
Neutro Abs: 2.7 10*3/uL (ref 1.7–7.7)
Neutrophils Relative %: 51 %
Platelets: 272 10*3/uL (ref 150–400)
RBC: 3.58 MIL/uL — ABNORMAL LOW (ref 3.87–5.11)
RDW: 12.7 % (ref 11.5–15.5)
WBC: 5.3 10*3/uL (ref 4.0–10.5)
nRBC: 0 % (ref 0.0–0.2)

## 2023-07-19 LAB — IRON AND TIBC
Iron: 174 ug/dL — ABNORMAL HIGH (ref 28–170)
Saturation Ratios: 87 % — ABNORMAL HIGH (ref 10.4–31.8)
TIBC: 200 ug/dL — ABNORMAL LOW (ref 250–450)
UIBC: 26 ug/dL

## 2023-07-19 LAB — FERRITIN: Ferritin: 33 ng/mL (ref 11–307)

## 2023-07-19 MED ORDER — HEPARIN SOD (PORK) LOCK FLUSH 100 UNIT/ML IV SOLN
500.0000 [IU] | Freq: Once | INTRAVENOUS | Status: AC
  Administered 2023-07-19: 500 [IU] via INTRAVENOUS
  Filled 2023-07-19: qty 5

## 2023-07-19 MED ORDER — SODIUM CHLORIDE 0.9% FLUSH
10.0000 mL | INTRAVENOUS | Status: DC | PRN
  Administered 2023-07-19: 10 mL via INTRAVENOUS
  Filled 2023-07-19: qty 10

## 2023-07-20 LAB — AFP TUMOR MARKER: AFP, Serum, Tumor Marker: 2.4 ng/mL (ref 0.0–6.4)

## 2023-07-21 ENCOUNTER — Other Ambulatory Visit: Payer: Self-pay | Admitting: Physician Assistant

## 2023-07-21 DIAGNOSIS — I10 Essential (primary) hypertension: Secondary | ICD-10-CM

## 2023-07-21 DIAGNOSIS — E039 Hypothyroidism, unspecified: Secondary | ICD-10-CM

## 2023-07-25 ENCOUNTER — Inpatient Hospital Stay

## 2023-07-25 ENCOUNTER — Inpatient Hospital Stay (HOSPITAL_BASED_OUTPATIENT_CLINIC_OR_DEPARTMENT_OTHER): Admitting: Oncology

## 2023-07-25 ENCOUNTER — Encounter: Payer: Self-pay | Admitting: Oncology

## 2023-07-25 DIAGNOSIS — Z452 Encounter for adjustment and management of vascular access device: Secondary | ICD-10-CM | POA: Diagnosis not present

## 2023-07-25 NOTE — Progress Notes (Signed)
No phleb today.

## 2023-07-25 NOTE — Progress Notes (Signed)
 [facility]  [phone] [phone]  ID: Christina Stephens OB: Dec 01, 1975  MR#: [id number]  QIH#:[id number]  Patient Care Team: Alan Ripper as PCP - General (Physician Assistant) Jeralyn Ruths, MD as Consulting Physician (Oncology)  CHIEF COMPLAINT: Hereditary hemochromatosis.  INTERVAL HISTORY: Patient returns to clinic today for repeat laboratory work and routine yearly evaluation.  She continues to feel well and at her baseline.  She has no new neurologic complaints.  She denies any recent fevers or illnesses.  She has a good appetite and denies weight loss.  She denies any chest pain, shortness of breath, cough, or hemoptysis.  She denies any nausea, vomiting, constipation, or diarrhea.  She has no urinary complaints.  Patient offers no further specific complaints today.  REVIEW OF SYSTEMS:   Review of Systems  Constitutional: Negative.  Negative for fever, malaise/fatigue and weight loss.  Respiratory: Negative.  Negative for cough, hemoptysis and shortness of breath.   Cardiovascular: Negative.  Negative for chest pain and leg swelling.  Gastrointestinal: Negative.  Negative for abdominal pain.  Genitourinary: Negative.  Negative for dysuria.  Musculoskeletal: Negative.  Negative for back pain.  Skin: Negative.  Negative for rash.  Neurological: Negative.  Negative for dizziness, focal weakness and weakness.  Psychiatric/Behavioral: Negative.  The patient is not nervous/anxious.     As per HPI. Otherwise, a complete review of systems is negative.  PAST MEDICAL HISTORY: Past Medical History:  Diagnosis Date   CPAP (continuous positive airway pressure) dependence    Hereditary hemochromatosis (HCC)    Hypertension    Hypothyroidism    Seizures (HCC)    Sleep apnea    Spina bifida (HCC)    Thyroid disease    hypothyroidism    PAST SURGICAL HISTORY: Past Surgical History:  Procedure Laterality Date   COLONOSCOPY  WITH PROPOFOL N/A 11/04/2021   Procedure: COLONOSCOPY WITH PROPOFOL;  Surgeon: Toney Reil, MD;  Location: [facility] ENDOSCOPY;  Service: Gastroenterology;  Laterality: N/A;   REDUCTION MAMMAPLASTY Bilateral 1993   VP STUNT HEAD SURGERYOther]  2000    FAMILY HISTORY: Family History  Problem Relation Age of Onset   Hypertension Father    CAD Father    Diabetes Sister    Arthritis Sister    Breast cancer Maternal Grandmother    CAD Paternal Grandfather    Ovarian cancer Maternal Aunt     ADVANCED DIRECTIVES (Y/N):  N  HEALTH MAINTENANCE: Social History   Tobacco Use   Smoking status: Never   Smokeless tobacco: Never  Vaping Use   Vaping status: Never Used  Substance Use Topics   Alcohol use: No   Drug use: No     Colonoscopy:  PAP:  Bone density:  Lipid panel:  Allergies  Allergen Reactions   Latex     Pt has Spina bifada and was told that if she has surgery she should avoid latex for poss. Reaction/infection.    Current Outpatient Medications  Medication Sig Dispense Refill   Calcium Carbonate-Vitamin D 600-200 MG-UNIT TABS Take by mouth.     CALCIUM-VITAMIN D PO Take 1 tablet by mouth daily.     levothyroxine (SYNTHROID) 50 MCG tablet TAKE 1 TABLET BY MOUTH EVERY DAY 90 tablet 1   metoprolol tartrate (LOPRESSOR) 25 MG tablet TAKE 1 TABLET BY MOUTH TWICE A DAY 180 tablet 1   phenytoin (DILANTIN) 100 MG ER capsule TAKE 2 CAPSULES (200 MG TOTAL) BY MOUTH 2 (TWO) TIMES DAILY.  11  No current facility-administered medications for this visit.   Facility-Administered Medications Ordered in Other Visits  Medication Dose Route Frequency Provider Last Rate Last Admin   sodium chloride flush (NS) 0.9 % injection 10 mL  10 mL Intravenous PRN Louretta Shorten R, MD   10 mL at 02/26/16 0917   sodium chloride flush (NS) 0.9 % injection 10 mL  10 mL Intravenous PRN Nelva Nay C, MD   10 mL at 01/01/18 1108   sodium chloride flush (NS) 0.9 % injection 10 mL  10  mL Intravenous PRN Jeralyn Ruths, MD   10 mL at 04/22/20 1100    OBJECTIVE: Vitals:   07/25/23 1308  BP: 116/78  Pulse: 72  Temp: (!) 96.8 F (36 C)  SpO2: 100%     Body mass index is 24.64 kg/m.    ECOG FS:0 - Asymptomatic  General: Well-developed, well-nourished, no acute distress.  Sitting in wheelchair. Eyes: Pink conjunctiva, anicteric sclera. HEENT: Normocephalic, moist mucous membranes. Lungs: No audible wheezing or coughing. Heart: Regular rate and rhythm. Abdomen: Soft, nontender, no obvious distention. Musculoskeletal: No edema, cyanosis, or clubbing. Neuro: Alert, answering all questions appropriately. Cranial nerves grossly intact. Skin: No rashes or petechiae noted. Psych: Normal affect.  LAB RESULTS:  Lab Results  Component Value Date   NA 136 04/03/2023   K 4.0 04/03/2023   CL 103 04/03/2023   CO2 28 04/03/2023   GLUCOSE 95 04/03/2023   BUN 19 04/03/2023   CREATININE 0.60 04/03/2023   CALCIUM 8.9 04/03/2023   PROT 7.2 04/03/2023   ALBUMIN 3.9 04/03/2023   AST 18 04/03/2023   ALT 18 04/03/2023   ALKPHOS 59 04/03/2023   BILITOT 0.3 04/03/2023   GFRNONAA >60 04/03/2023   GFRAA 126 10/21/2019    Lab Results  Component Value Date   WBC 5.3 07/19/2023   NEUTROABS 2.7 07/19/2023   HGB 11.9 (L) 07/19/2023   HCT 35.0 (L) 07/19/2023   MCV 97.8 07/19/2023   PLT 272 07/19/2023   Lab Results  Component Value Date   IRON 174 (H) 07/19/2023   TIBC 200 (L) 07/19/2023   IRONPCTSAT 87 (H) 07/19/2023   Lab Results  Component Value Date   FERRITIN 33 07/19/2023     STUDIES: No results found.  ASSESSMENT: Hereditary hemochromatosis.   PLAN:    Hereditary hemochromatosis: Originally diagnosed in July 2011 with homozygous C282Y mutation.  Previously, patient had a difficult time with phlebotomy and was initiated on Exjade for iron chelation.  She previously was taking 250 mg daily from December 2014 through September 2020, but despite  treatments her iron stores are remained relatively unchanged.  AFP and LFTs remain within normal limits.  Her most recent abdominal ultrasound on June 20, 2018 did not reveal any suspicious abnormalities in her liver.  Despite discontinuing Exjade and not having received phlebotomy, patient's iron stores remain elevated, but unchanged.  No intervention is needed at this time.  Continue monitoring patient on a yearly basis with laboratory work and evaluation.   Port maintenance: Continue port flushes every 6 to 8 weeks. Spina bifida: Chronic and unchanged.  I spent a total of 20 minutes reviewing chart data, face-to-face evaluation with the patient, counseling and coordination of care as detailed above.  Patient expressed understanding and was in agreement with this plan. She also understands that She can call clinic at any time with any questions, concerns, or complaints.    Jeralyn Ruths, MD   07/25/2023 1:44 PM

## 2023-08-15 ENCOUNTER — Inpatient Hospital Stay

## 2023-09-05 ENCOUNTER — Inpatient Hospital Stay: Attending: Oncology

## 2023-09-05 ENCOUNTER — Inpatient Hospital Stay

## 2023-09-05 DIAGNOSIS — Z95828 Presence of other vascular implants and grafts: Secondary | ICD-10-CM

## 2023-09-05 DIAGNOSIS — Z452 Encounter for adjustment and management of vascular access device: Secondary | ICD-10-CM | POA: Insufficient documentation

## 2023-09-05 MED ORDER — HEPARIN SOD (PORK) LOCK FLUSH 100 UNIT/ML IV SOLN
500.0000 [IU] | Freq: Once | INTRAVENOUS | Status: AC
  Administered 2023-09-05: 500 [IU] via INTRAVENOUS
  Filled 2023-09-05: qty 5

## 2023-09-05 MED ORDER — SODIUM CHLORIDE 0.9% FLUSH
10.0000 mL | Freq: Once | INTRAVENOUS | Status: AC
  Administered 2023-09-05: 10 mL via INTRAVENOUS
  Filled 2023-09-05: qty 10

## 2023-10-09 ENCOUNTER — Encounter: Payer: Self-pay | Admitting: Physician Assistant

## 2023-10-09 ENCOUNTER — Ambulatory Visit (INDEPENDENT_AMBULATORY_CARE_PROVIDER_SITE_OTHER): Payer: PRIVATE HEALTH INSURANCE | Admitting: Physician Assistant

## 2023-10-09 VITALS — BP 121/80 | HR 73 | Temp 98.4°F | Resp 16 | Ht <= 58 in | Wt 106.0 lb

## 2023-10-09 DIAGNOSIS — Z0001 Encounter for general adult medical examination with abnormal findings: Secondary | ICD-10-CM | POA: Diagnosis not present

## 2023-10-09 DIAGNOSIS — E782 Mixed hyperlipidemia: Secondary | ICD-10-CM

## 2023-10-09 DIAGNOSIS — I10 Essential (primary) hypertension: Secondary | ICD-10-CM

## 2023-10-09 DIAGNOSIS — Z1231 Encounter for screening mammogram for malignant neoplasm of breast: Secondary | ICD-10-CM

## 2023-10-09 DIAGNOSIS — E039 Hypothyroidism, unspecified: Secondary | ICD-10-CM | POA: Diagnosis not present

## 2023-10-09 DIAGNOSIS — R5383 Other fatigue: Secondary | ICD-10-CM

## 2023-10-09 NOTE — Progress Notes (Signed)
 [facility] [address] [place], [place] [address]  Internal MEDICINE  Office Visit Note  Patient Name: Christina Stephens  [id number]  [id number]  Date of Service: 10/09/2023  Chief Complaint  Patient presents with   Annual Exam   Hypertension     HPI Pt is here for routine health maintenance examination -doing well, no concerns today -BP stable -due for pap, on cycle now and wants to hold off on this. It is very difficult for her to get on exam table in general and reports being told by another office she may not need continued screenings as she is not sexually active. Did discuss cervical cancer screening is still recommended regardless of sexual activity, but pt may still choose to decline which she does at this time -sleeping well, using cpap nightly  -sees neurology for seizure disorder -followed by hematology for hemachromatosis, has port flushes q6-8weeks -due for labs for thyroid /lipid/cmp; cbc followed by hematology and done recently  Current Medication: Outpatient Encounter Medications as of 10/09/2023  Medication Sig Note   Calcium Carbonate-Vitamin D  600-200 MG-UNIT TABS Take by mouth.    CALCIUM-VITAMIN D  PO Take 1 tablet by mouth daily.    levothyroxine  (SYNTHROID ) 50 MCG tablet TAKE 1 TABLET BY MOUTH EVERY DAY    metoprolol  tartrate (LOPRESSOR ) 25 MG tablet TAKE 1 TABLET BY MOUTH TWICE A DAY    phenytoin  (DILANTIN ) 100 MG ER capsule TAKE 2 CAPSULES (200 MG TOTAL) BY MOUTH 2 (TWO) TIMES DAILY. 01/02/2015: Received from: External Pharmacy   Facility-Administered Encounter Medications as of 10/09/2023  Medication   sodium chloride  flush (NS) 0.9 % injection 10 mL   sodium chloride  flush (NS) 0.9 % injection 10 mL   sodium chloride  flush (NS) 0.9 % injection 10 mL    Surgical History: Past Surgical History:  Procedure Laterality Date   COLONOSCOPY WITH PROPOFOL  N/A 11/04/2021   Procedure: COLONOSCOPY WITH PROPOFOL ;  Surgeon: Selena Daily, MD;   Location: [facility] ENDOSCOPY;  Service: Gastroenterology;  Laterality: N/A;   REDUCTION MAMMAPLASTY Bilateral 1993   VP STUNT HEAD SURGERYOther]  2000    Medical History: Past Medical History:  Diagnosis Date   CPAP (continuous positive airway pressure) dependence    Hereditary hemochromatosis (HCC)    Hypertension    Hypothyroidism    Seizures (HCC)    Sleep apnea    Spina bifida (HCC)    Thyroid  disease    hypothyroidism    Family History: Family History  Problem Relation Age of Onset   Hypertension Father    CAD Father    Diabetes Sister    Arthritis Sister    Breast cancer Maternal Grandmother    CAD Paternal Grandfather    Ovarian cancer Maternal Aunt       Review of Systems  Constitutional:  Negative for chills, diaphoresis and fatigue.  HENT:  Negative for ear pain, postnasal drip and sinus pressure.   Eyes:  Negative for photophobia, discharge, redness, itching and visual disturbance.  Respiratory:  Negative for cough, shortness of breath and wheezing.   Cardiovascular:  Negative for chest pain, palpitations and leg swelling.  Gastrointestinal:  Negative for abdominal pain, constipation, diarrhea, nausea and vomiting.  Genitourinary:  Negative for dysuria and flank pain.  Musculoskeletal:  Positive for gait problem. Negative for arthralgias, back pain and neck pain.  Skin:  Negative for color change.  Allergic/Immunologic: Negative for environmental allergies and food allergies.  Neurological:  Negative for dizziness and headaches.  Hematological:  Does not bruise/bleed easily.  Psychiatric/Behavioral:  Negative for agitation, behavioral problems (depression) and hallucinations.      Vital Signs: BP 121/80   Pulse 73   Temp 98.4 F (36.9 C)   Resp 16   Ht 4\' 7"  (1.397 m)   Wt 106 lb (48.1 kg)   SpO2 97%   BMI 24.64 kg/m    Physical Exam Vitals and nursing note reviewed.  Constitutional:      General: She is not in acute distress.    Appearance:  She is well-developed. She is not diaphoretic.  HENT:     Head: Normocephalic and atraumatic.     Mouth/Throat:     Pharynx: No oropharyngeal exudate.  Eyes:     Extraocular Movements: Extraocular movements intact.  Neck:     Thyroid : No thyromegaly.     Vascular: No JVD.     Trachea: No tracheal deviation.  Cardiovascular:     Rate and Rhythm: Normal rate and regular rhythm.     Heart sounds: Normal heart sounds. No murmur heard.    No friction rub. No gallop.  Pulmonary:     Effort: Pulmonary effort is normal. No respiratory distress.     Breath sounds: No wheezing or rales.  Chest:     Chest wall: No tenderness.  Abdominal:     General: Bowel sounds are normal.     Palpations: Abdomen is soft.     Tenderness: There is no abdominal tenderness.  Skin:    General: Skin is warm and dry.  Neurological:     Mental Status: She is alert and oriented to person, place, and time.     Cranial Nerves: No cranial nerve deficit.     Gait: Gait abnormal.  Psychiatric:        Behavior: Behavior normal.        Thought Content: Thought content normal.        Judgment: Judgment normal.      LABS: Recent Results (from the past 2160 hours)  Iron and TIBC([facility])     Status: Abnormal   Collection Time: 07/19/23 10:29 AM  Result Value Ref Range   Iron 174 (H) 28 - 170 ug/dL   TIBC 540 (L) 981 - 191 ug/dL   Saturation Ratios 87 (H) 10.4 - 31.8 %   UIBC 26 ug/dL    Comment: Performed at [facility], [address] [place], [place] [address]  AFP tumor marker     Status: None   Collection Time: 07/19/23 10:29 AM  Result Value Ref Range   AFP, Serum, Tumor Marker 2.4 0.0 - 6.4 ng/mL    Comment: (NOTE) Roche [employer] Electrochemiluminescence Immunoassay (ECLIA) Values obtained with different assay methods or kits cannot be used interchangeably.  Results cannot be interpreted as absolute evidence of the presence or absence of malignant disease. This test is not  interpretable in pregnant females. Performed At: [facility] [address] [place], [place] [id number] Pearlean Botts MD HQ:[id number]   Ferritin     Status: None   Collection Time: 07/19/23 10:29 AM  Result Value Ref Range   Ferritin 33 11 - 307 ng/mL    Comment: Performed at [facility], [address] [place], [place] [address]  CBC with Differential/Platelet     Status: Abnormal   Collection Time: 07/19/23 10:29 AM  Result Value Ref Range   WBC 5.3 4.0 - 10.5 K/uL   RBC 3.58 (L) 3.87 - 5.11 MIL/uL   Hemoglobin 11.9 (  L) 12.0 - 15.0 g/dL   HCT 16.1 (L) 09.6 - 04.5 %   MCV 97.8 80.0 - 100.0 fL   MCH 33.2 26.0 - 34.0 pg   MCHC 34.0 30.0 - 36.0 g/dL   RDW 40.9 81.1 - 91.4 %   Platelets 272 150 - 400 K/uL   nRBC 0.0 0.0 - 0.2 %   Neutrophils Relative % 51 %   Neutro Abs 2.7 1.7 - 7.7 K/uL   Lymphocytes Relative 35 %   Lymphs Abs 1.9 0.7 - 4.0 K/uL   Monocytes Relative 10 %   Monocytes Absolute 0.5 0.1 - 1.0 K/uL   Eosinophils Relative 3 %   Eosinophils Absolute 0.2 0.0 - 0.5 K/uL   Basophils Relative 1 %   Basophils Absolute 0.0 0.0 - 0.1 K/uL   Immature Granulocytes 0 %   Abs Immature Granulocytes 0.01 0.00 - 0.07 K/uL    Comment: Performed at [facility], [address] [place], [place] [address]        Assessment/Plan: 1. Encounter for general adult medical examination with abnormal findings (Primary) CPE performed, labs ordered, mammogram ordered, pt declines pap. UTD on colonoscopy  2. Acquired hypothyroidism Will check labs and adjust synthroid  as indicated - TSH + free T4  3. Essential hypertension Stable, continue current medication  4. Visit for screening mammogram - MM 3D SCREENING MAMMOGRAM BILATERAL BREAST; Future  5. Mixed hyperlipidemia - Lipid Panel With LDL/HDL Ratio  6. Other fatigue - TSH + free T4 - Lipid Panel With LDL/HDL Ratio - Comprehensive metabolic panel with GFR   General Counseling: Christina Stephens  verbalizes understanding of the findings of todays visit and agrees with plan of treatment. I have discussed any further diagnostic evaluation that may be needed or ordered today. We also reviewed her medications today. she has been encouraged to call the office with any questions or concerns that should arise related to todays visit.    Counseling:    Orders Placed This Encounter  Procedures   MM 3D SCREENING MAMMOGRAM BILATERAL BREAST   TSH + free T4   Lipid Panel With LDL/HDL Ratio   Comprehensive metabolic panel with GFR    No orders of the defined types were placed in this encounter.   This patient was seen by Taylor Favia, PA-C in collaboration with Dr. Verneta Gone as a part of collaborative care agreement.  Total time spent:35 Minutes  Time spent includes review of chart, medications, test results, and follow up plan with the patient.     Lawton Price, MD  Internal Medicine

## 2023-10-17 ENCOUNTER — Encounter: Payer: Self-pay | Admitting: Nurse Practitioner

## 2023-10-17 ENCOUNTER — Encounter: Payer: Self-pay | Admitting: Oncology

## 2023-10-17 ENCOUNTER — Inpatient Hospital Stay: Attending: Oncology

## 2023-10-17 ENCOUNTER — Ambulatory Visit (INDEPENDENT_AMBULATORY_CARE_PROVIDER_SITE_OTHER): Payer: PRIVATE HEALTH INSURANCE | Admitting: Internal Medicine

## 2023-10-17 VITALS — BP 116/68 | HR 77 | Temp 98.2°F | Resp 16 | Ht <= 58 in | Wt 106.0 lb

## 2023-10-17 DIAGNOSIS — G4733 Obstructive sleep apnea (adult) (pediatric): Secondary | ICD-10-CM | POA: Diagnosis not present

## 2023-10-17 DIAGNOSIS — Z7189 Other specified counseling: Secondary | ICD-10-CM | POA: Diagnosis not present

## 2023-10-17 NOTE — Progress Notes (Signed)
 [facility] [address] [place], [place] [address]  Internal MEDICINE  Office Visit Note  Patient Name: Christina Stephens  [id number]  [id number]  Date of Service: 10/17/2023  Chief Complaint  Patient presents with   Follow-up    HPI Pt is here for CPAP COMPLIANCE follow up  Patient is here for routine follow up for obstructive sleep apnea. Patient is on CPAP of 10  cm of H2O Compliance with CPAP is good.  Sleeps well with current pressure  No leaks  with mask or tubing placement.     Current Medication: Outpatient Encounter Medications as of 10/17/2023  Medication Sig Note   Calcium Carbonate-Vitamin D  600-200 MG-UNIT TABS Take by mouth.    CALCIUM-VITAMIN D  PO Take 1 tablet by mouth daily.    levothyroxine  (SYNTHROID ) 50 MCG tablet TAKE 1 TABLET BY MOUTH EVERY DAY    metoprolol  tartrate (LOPRESSOR ) 25 MG tablet TAKE 1 TABLET BY MOUTH TWICE A DAY    phenytoin  (DILANTIN ) 100 MG ER capsule TAKE 2 CAPSULES (200 MG TOTAL) BY MOUTH 2 (TWO) TIMES DAILY. 01/02/2015: Received from: External Pharmacy   Facility-Administered Encounter Medications as of 10/17/2023  Medication   sodium chloride  flush (NS) 0.9 % injection 10 mL   sodium chloride  flush (NS) 0.9 % injection 10 mL   sodium chloride  flush (NS) 0.9 % injection 10 mL    Surgical History: Past Surgical History:  Procedure Laterality Date   COLONOSCOPY WITH PROPOFOL  N/A 11/04/2021   Procedure: COLONOSCOPY WITH PROPOFOL ;  Surgeon: Selena Daily, MD;  Location: [facility] ENDOSCOPY;  Service: Gastroenterology;  Laterality: N/A;   REDUCTION MAMMAPLASTY Bilateral 1993   VP STUNT HEAD SURGERYOther]  2000    Medical History: Past Medical History:  Diagnosis Date   CPAP (continuous positive airway pressure) dependence    Hereditary hemochromatosis (HCC)    Hypertension    Hypothyroidism    Seizures (HCC)    Sleep apnea    Spina bifida (HCC)    Thyroid  disease    hypothyroidism    Family History: Family  History  Problem Relation Age of Onset   Hypertension Father    CAD Father    Diabetes Sister    Arthritis Sister    Breast cancer Maternal Grandmother    CAD Paternal Grandfather    Ovarian cancer Maternal Aunt     Social History   Socioeconomic History   Marital status: Single    Spouse name: Not on file   Number of children: Not on file   Years of education: Not on file   Highest education level: Not on file  Occupational History   Not on file  Tobacco Use   Smoking status: Never   Smokeless tobacco: Never  Vaping Use   Vaping status: Never Used  Substance and Sexual Activity   Alcohol use: No   Drug use: No   Sexual activity: Not Currently  Other Topics Concern   Not on file  Social History Narrative   Not on file   Social Drivers of Health   Financial Resource Strain: Not on file  Food Insecurity: Not on file  Transportation Needs: Not on file  Physical Activity: Not on file  Stress: Not on file  Social Connections: Not on file  Intimate Partner Violence: Not on file      Review of Systems  Constitutional:  Negative for fatigue and fever.  HENT:  Negative for congestion, mouth sores and postnasal drip.   Respiratory:  Negative  for cough.   Cardiovascular:  Negative for chest pain.  Genitourinary:  Negative for flank pain.  Psychiatric/Behavioral: Negative.      Vital Signs: BP 116/68   Pulse 77   Temp 98.2 F (36.8 C)   Resp 16   Ht 4\' 7"  (1.397 m)   Wt 106 lb (48.1 kg)   SpO2 98%   BMI 24.64 kg/m    Physical Exam Constitutional:      Appearance: Normal appearance.  HENT:     Head: Normocephalic and atraumatic.     Nose: Nose normal.     Mouth/Throat:     Mouth: Mucous membranes are moist.     Pharynx: No posterior oropharyngeal erythema.  Eyes:     Extraocular Movements: Extraocular movements intact.     Pupils: Pupils are equal, round, and reactive to light.  Cardiovascular:     Pulses: Normal pulses.     Heart sounds: Normal  heart sounds.  Pulmonary:     Effort: Pulmonary effort is normal.     Breath sounds: Normal breath sounds.  Neurological:     General: No focal deficit present.     Mental Status: She is alert.  Psychiatric:        Mood and Affect: Mood normal.        Behavior: Behavior normal.        Assessment/Plan: 1. OSA on CPAP (Primary) Continue on current settings of CPAP 10 CM OF H2O   2. CPAP use counseling  CPAP compliance and usage discussed, pt has CP and mom is there to help  General Counseling: Darchelle verbalizes understanding of the findings of todays visit and agrees with plan of treatment. I have discussed any further diagnostic evaluation that may be needed or ordered today. We also reviewed her medications today. she has been encouraged to call the office with any questions or concerns that should arise related to todays visit.     Time spent includes 15 min review of chart, medications, test results, and follow up plan with the patient.   [place] Controlled Substance Database was reviewed by me.   Dr Shaquan Puerta M Leondra Cullin Internal medicine

## 2023-10-18 ENCOUNTER — Ambulatory Visit: Payer: Self-pay | Admitting: Physician Assistant

## 2023-10-18 LAB — SPECIMEN STATUS REPORT

## 2023-10-18 LAB — COMPREHENSIVE METABOLIC PANEL WITH GFR
ALT: 16 IU/L (ref 0–32)
AST: 21 IU/L (ref 0–40)
Albumin: 4.2 g/dL (ref 3.9–4.9)
Alkaline Phosphatase: 88 IU/L (ref 44–121)
BUN/Creatinine Ratio: 31 — ABNORMAL HIGH (ref 9–23)
BUN: 19 mg/dL (ref 6–24)
Bilirubin Total: <0.2 mg/dL (ref 0.0–1.2)
CO2: 22 mmol/L (ref 20–29)
Calcium: 9.4 mg/dL (ref 8.7–10.2)
Chloride: 102 mmol/L (ref 96–106)
Creatinine, Ser: 0.61 mg/dL (ref 0.57–1.00)
Globulin, Total: 2.8 g/dL (ref 1.5–4.5)
Glucose: 80 mg/dL (ref 70–99)
Potassium: 4.2 mmol/L (ref 3.5–5.2)
Sodium: 138 mmol/L (ref 134–144)
Total Protein: 7 g/dL (ref 6.0–8.5)
eGFR: 111 mL/min/{1.73_m2} (ref >59)

## 2023-10-18 LAB — LIPID PANEL WITH LDL/HDL RATIO
Cholesterol, Total: 150 mg/dL (ref 100–199)
HDL: 64 mg/dL (ref >39)
LDL Chol Calc (NIH): 74 mg/dL (ref 0–99)
LDL/HDL Ratio: 1.2 ratio (ref 0.0–3.2)
Triglycerides: 58 mg/dL (ref 0–149)
VLDL Cholesterol Cal: 12 mg/dL (ref 5–40)

## 2023-10-18 LAB — TSH+FREE T4
Free T4: 1.17 ng/dL (ref 0.82–1.77)
TSH: 0.66 u[IU]/mL (ref 0.450–4.500)

## 2023-10-18 NOTE — Telephone Encounter (Signed)
Pt notified for labs result  

## 2023-10-18 NOTE — Telephone Encounter (Signed)
-----   Message from Jacques Mattock sent at 10/18/2023 12:26 PM EDT ----- Please let her know her labs are stable

## 2023-11-13 ENCOUNTER — Ambulatory Visit
Admission: RE | Admit: 2023-11-13 | Discharge: 2023-11-13 | Disposition: A | Discharge status: Home / Self Care | Source: Ambulatory Visit | Attending: Physician Assistant | Admitting: Physician Assistant

## 2023-11-13 DIAGNOSIS — Z1231 Encounter for screening mammogram for malignant neoplasm of breast: Secondary | ICD-10-CM | POA: Diagnosis present

## 2023-11-28 ENCOUNTER — Inpatient Hospital Stay: Attending: Oncology

## 2023-11-28 DIAGNOSIS — Z452 Encounter for adjustment and management of vascular access device: Secondary | ICD-10-CM | POA: Diagnosis present

## 2023-11-28 DIAGNOSIS — Z95828 Presence of other vascular implants and grafts: Secondary | ICD-10-CM

## 2023-11-28 MED ORDER — HEPARIN SOD (PORK) LOCK FLUSH 100 UNIT/ML IV SOLN
500.0000 [IU] | Freq: Once | INTRAVENOUS | Status: AC
  Administered 2023-11-28: 500 [IU] via INTRAVENOUS
  Filled 2023-11-28: qty 5

## 2023-11-28 MED ORDER — SODIUM CHLORIDE 0.9% FLUSH
10.0000 mL | Freq: Once | INTRAVENOUS | Status: AC
  Administered 2023-11-28: 10 mL via INTRAVENOUS
  Filled 2023-11-28: qty 10

## 2024-01-06 ENCOUNTER — Other Ambulatory Visit: Payer: Self-pay | Admitting: Physician Assistant

## 2024-01-06 DIAGNOSIS — I10 Essential (primary) hypertension: Secondary | ICD-10-CM

## 2024-01-09 ENCOUNTER — Inpatient Hospital Stay

## 2024-01-16 ENCOUNTER — Inpatient Hospital Stay: Attending: Oncology

## 2024-01-16 DIAGNOSIS — Z452 Encounter for adjustment and management of vascular access device: Secondary | ICD-10-CM | POA: Insufficient documentation

## 2024-02-06 ENCOUNTER — Inpatient Hospital Stay

## 2024-02-12 ENCOUNTER — Other Ambulatory Visit: Payer: Self-pay | Admitting: Physician Assistant

## 2024-02-12 DIAGNOSIS — E039 Hypothyroidism, unspecified: Secondary | ICD-10-CM

## 2024-02-20 ENCOUNTER — Inpatient Hospital Stay: Attending: Oncology

## 2024-02-20 DIAGNOSIS — Z452 Encounter for adjustment and management of vascular access device: Secondary | ICD-10-CM | POA: Diagnosis present

## 2024-03-19 ENCOUNTER — Inpatient Hospital Stay

## 2024-04-02 ENCOUNTER — Inpatient Hospital Stay: Attending: Oncology

## 2024-04-02 DIAGNOSIS — Z452 Encounter for adjustment and management of vascular access device: Secondary | ICD-10-CM | POA: Diagnosis present

## 2024-04-08 ENCOUNTER — Telehealth: Payer: Self-pay

## 2024-04-08 ENCOUNTER — Encounter: Payer: Self-pay | Admitting: Physician Assistant

## 2024-04-08 ENCOUNTER — Ambulatory Visit (INDEPENDENT_AMBULATORY_CARE_PROVIDER_SITE_OTHER): Payer: PRIVATE HEALTH INSURANCE | Admitting: Physician Assistant

## 2024-04-08 VITALS — BP 130/80 | HR 75 | Temp 97.8°F | Resp 16 | Ht <= 58 in | Wt 106.0 lb

## 2024-04-08 DIAGNOSIS — R269 Unspecified abnormalities of gait and mobility: Secondary | ICD-10-CM | POA: Diagnosis not present

## 2024-04-08 DIAGNOSIS — E039 Hypothyroidism, unspecified: Secondary | ICD-10-CM

## 2024-04-08 DIAGNOSIS — I10 Essential (primary) hypertension: Secondary | ICD-10-CM

## 2024-04-08 DIAGNOSIS — Q059 Spina bifida, unspecified: Secondary | ICD-10-CM

## 2024-04-08 NOTE — Progress Notes (Signed)
 [facility] [address] [place], [place] [address]  Internal MEDICINE  Office Visit Note  Patient Name: Christina Stephens  [id number]  [id number]  Date of Service: 04/08/2024  Chief Complaint  Patient presents with   Follow-up    HPI Pt is here for routine follow up -uses crutches normally, but hard to get up ramps with these and has had some falls in the past -will need rollator to help -seeing neurology locally now with Dr. Maree rather than going over to [facility]. Continues to take phenytoin  -Taking meds as prescribed  Current Medication: Outpatient Encounter Medications as of 04/08/2024  Medication Sig Note   Calcium Carbonate-Vitamin D  600-200 MG-UNIT TABS Take by mouth.    CALCIUM-VITAMIN D  PO Take 1 tablet by mouth daily.    levothyroxine  (SYNTHROID ) 50 MCG tablet TAKE 1 TABLET BY MOUTH EVERY DAY    metoprolol  tartrate (LOPRESSOR ) 25 MG tablet TAKE 1 TABLET BY MOUTH TWICE A DAY    phenytoin  (DILANTIN ) 100 MG ER capsule TAKE 2 CAPSULES (200 MG TOTAL) BY MOUTH 2 (TWO) TIMES DAILY. 01/02/2015: Received from: External Pharmacy   Facility-Administered Encounter Medications as of 04/08/2024  Medication   sodium chloride  flush (NS) 0.9 % injection 10 mL   sodium chloride  flush (NS) 0.9 % injection 10 mL   sodium chloride  flush (NS) 0.9 % injection 10 mL    Surgical History: Past Surgical History:  Procedure Laterality Date   COLONOSCOPY WITH PROPOFOL  N/A 11/04/2021   Procedure: COLONOSCOPY WITH PROPOFOL ;  Surgeon: Unk Corinn Skiff, MD;  Location: [facility] ENDOSCOPY;  Service: Gastroenterology;  Laterality: N/A;   REDUCTION MAMMAPLASTY Bilateral 1993   VP STUNT HEAD SURGERYOther]  2000    Medical History: Past Medical History:  Diagnosis Date   CPAP (continuous positive airway pressure) dependence    Hereditary hemochromatosis (HCC)    Hypertension    Hypothyroidism    Seizures (HCC)    Sleep apnea    Spina bifida (HCC)    Thyroid  disease    hypothyroidism     Family History: Family History  Problem Relation Age of Onset   Hypertension Father    CAD Father    Diabetes Sister    Arthritis Sister    Breast cancer Maternal Grandmother    CAD Paternal Grandfather    Ovarian cancer Maternal Aunt     Social History   Socioeconomic History   Marital status: Single    Spouse name: Not on file   Number of children: Not on file   Years of education: Not on file   Highest education level: Not on file  Occupational History   Not on file  Tobacco Use   Smoking status: Never   Smokeless tobacco: Never  Vaping Use   Vaping status: Never Used  Substance and Sexual Activity   Alcohol use: No   Drug use: No   Sexual activity: Not Currently  Other Topics Concern   Not on file  Social History Narrative   Not on file   Social Drivers of Health   Financial Resource Strain: Not on file  Food Insecurity: Not on file  Transportation Needs: Not on file  Physical Activity: Not on file  Stress: Not on file  Social Connections: Not on file  Intimate Partner Violence: Not on file      Review of Systems  Constitutional:  Negative for chills, diaphoresis and fatigue.  HENT:  Negative for ear pain, postnasal drip and sinus pressure.   Eyes:  Negative for photophobia, discharge, redness, itching and visual disturbance.  Respiratory:  Negative for cough, shortness of breath and wheezing.   Cardiovascular:  Negative for chest pain, palpitations and leg swelling.  Gastrointestinal:  Negative for abdominal pain, constipation, diarrhea, nausea and vomiting.  Genitourinary:  Negative for dysuria and flank pain.  Musculoskeletal:  Positive for gait problem. Negative for arthralgias, back pain and neck pain.  Skin:  Negative for color change.  Allergic/Immunologic: Negative for environmental allergies and food allergies.  Neurological:  Negative for dizziness and headaches.  Hematological:  Does not bruise/bleed easily.  Psychiatric/Behavioral:   Negative for agitation, behavioral problems (depression) and hallucinations.     Vital Signs: BP 130/80   Pulse 75   Temp 97.8 F (36.6 C)   Resp 16   Ht 4' 7 (1.397 m)   Wt 106 lb (48.1 kg) Comment: at home  SpO2 98%   BMI 24.64 kg/m    Physical Exam Vitals and nursing note reviewed.  Constitutional:      General: She is not in acute distress.    Appearance: She is well-developed. She is not diaphoretic.  HENT:     Head: Normocephalic and atraumatic.  Eyes:     Extraocular Movements: Extraocular movements intact.  Neck:     Thyroid : No thyromegaly.     Vascular: No JVD.     Trachea: No tracheal deviation.  Cardiovascular:     Rate and Rhythm: Normal rate and regular rhythm.     Heart sounds: Normal heart sounds. No murmur heard.    No friction rub. No gallop.  Pulmonary:     Effort: Pulmonary effort is normal. No respiratory distress.     Breath sounds: No wheezing or rales.  Chest:     Chest wall: No tenderness.  Skin:    General: Skin is warm and dry.  Neurological:     Mental Status: She is alert and oriented to person, place, and time.     Gait: Gait abnormal.  Psychiatric:        Behavior: Behavior normal.        Thought Content: Thought content normal.        Judgment: Judgment normal.        Assessment/Plan: 1. Spina bifida, unspecified hydrocephalus presence, unspecified spinal region (HCC) (Primary) Followed by neurology, will benefit from walker with seat for safety - For home use only DME 4 wheeled rolling walker with seat ([id number])  2. Gait abnormality Followed by neurology, will benefit from walker with seat for safety - For home use only DME 4 wheeled rolling walker with seat ([id number])  3. Essential hypertension Stable, continue current medication  4. Acquired hypothyroidism Continue synthroid  as before   General Counseling: Junetta verbalizes understanding of the findings of todays visit and agrees with plan of treatment. I have  discussed any further diagnostic evaluation that may be needed or ordered today. We also reviewed her medications today. she has been encouraged to call the office with any questions or concerns that should arise related to todays visit.    Orders Placed This Encounter  Procedures   For home use only DME 4 wheeled rolling walker with seat ([id number])    No orders of the defined types were placed in this encounter.   This patient was seen by Tinnie Pro, PA-C in collaboration with Dr. Sigrid Bathe as a part of collaborative care agreement.   Total time spent:30 Minutes Time spent includes review of chart, medications, test results, and  follow up plan with the patient.      Dr Fozia M Khan Internal medicine

## 2024-04-08 NOTE — Telephone Encounter (Signed)
 Sent message to adapt  for walker

## 2024-04-15 ENCOUNTER — Ambulatory Visit: Payer: PRIVATE HEALTH INSURANCE | Admitting: Internal Medicine

## 2024-04-23 ENCOUNTER — Telehealth: Payer: Self-pay | Admitting: Internal Medicine

## 2024-04-23 ENCOUNTER — Ambulatory Visit: Payer: PRIVATE HEALTH INSURANCE | Admitting: Internal Medicine

## 2024-04-23 ENCOUNTER — Encounter: Payer: Self-pay | Admitting: Internal Medicine

## 2024-04-23 VITALS — BP 118/71 | HR 83 | Temp 98.0°F | Resp 16 | Ht <= 58 in | Wt 106.0 lb

## 2024-04-23 DIAGNOSIS — R634 Abnormal weight loss: Secondary | ICD-10-CM

## 2024-04-23 DIAGNOSIS — G4733 Obstructive sleep apnea (adult) (pediatric): Secondary | ICD-10-CM | POA: Diagnosis not present

## 2024-04-23 NOTE — Progress Notes (Signed)
 [facility] [address] [place], [place] [address]  Pulmonary Sleep Medicine   Office Visit Note  Patient Name: Christina Stephens DOB: [birth year] MRN [id number]  Date of Service: 04/23/2024  Complaints/HPI: OSA. She had a download and shows her AHI is 16.7 despite 100% compliance. She states she does feel tired during the daytime. She is currently on a pressure of 10CWP. Patient is not sure if she has a forensic psychologist. May be able to adjust to autoset or increase empiracally  Office Spirometry Results:     ROS  General: (-) fever, (-) chills, (-) night sweats, (-) weakness Skin: (-) rashes, (-) itching,. Eyes: (-) visual changes, (-) redness, (-) itching. Nose and Sinuses: (-) nasal stuffiness or itchiness, (-) postnasal drip, (-) nosebleeds, (-) sinus trouble. Mouth and Throat: (-) sore throat, (-) hoarseness. Neck: (-) swollen glands, (-) enlarged thyroid , (-) neck pain. Respiratory: - cough, (-) bloody sputum, - shortness of breath, - wheezing. Cardiovascular: - ankle swelling, (-) chest pain. Lymphatic: (-) lymph node enlargement. Neurologic: (-) numbness, (-) tingling. Psychiatric: (-) anxiety, (-) depression   Current Medication: Outpatient Encounter Medications as of 04/23/2024  Medication Sig Note   Calcium Carbonate-Vitamin D  600-200 MG-UNIT TABS Take by mouth.    CALCIUM-VITAMIN D  PO Take 1 tablet by mouth daily.    levothyroxine  (SYNTHROID ) 50 MCG tablet TAKE 1 TABLET BY MOUTH EVERY DAY    metoprolol  tartrate (LOPRESSOR ) 25 MG tablet TAKE 1 TABLET BY MOUTH TWICE A DAY    phenytoin  (DILANTIN ) 100 MG ER capsule TAKE 2 CAPSULES (200 MG TOTAL) BY MOUTH 2 (TWO) TIMES DAILY. 01/02/2015: Received from: External Pharmacy   Facility-Administered Encounter Medications as of 04/23/2024  Medication   sodium chloride  flush (NS) 0.9 % injection 10 mL   sodium chloride  flush (NS) 0.9 % injection 10 mL   sodium chloride  flush (NS) 0.9 % injection 10 mL     Surgical History: Past Surgical History:  Procedure Laterality Date   COLONOSCOPY WITH PROPOFOL  N/A 11/04/2021   Procedure: COLONOSCOPY WITH PROPOFOL ;  Surgeon: Unk Corinn Skiff, MD;  Location: [facility] ENDOSCOPY;  Service: Gastroenterology;  Laterality: N/A;   REDUCTION MAMMAPLASTY Bilateral 1993   VP STUNT HEAD SURGERYOther]  2000    Medical History: Past Medical History:  Diagnosis Date   CPAP (continuous positive airway pressure) dependence    Hereditary hemochromatosis (HCC)    Hypertension    Hypothyroidism    Seizures (HCC)    Sleep apnea    Spina bifida (HCC)    Thyroid  disease    hypothyroidism    Family History: Family History  Problem Relation Age of Onset   Hypertension Father    CAD Father    Diabetes Sister    Arthritis Sister    Breast cancer Maternal Grandmother    CAD Paternal Grandfather    Ovarian cancer Maternal Aunt     Social History: Social History   Socioeconomic History   Marital status: Single    Spouse name: Not on file   Number of children: Not on file   Years of education: Not on file   Highest education level: Not on file  Occupational History   Not on file  Tobacco Use   Smoking status: Never   Smokeless tobacco: Never  Vaping Use   Vaping status: Never Used  Substance and Sexual Activity   Alcohol use: No   Drug use: No   Sexual activity: Not Currently  Other Topics Concern   Not on  file  Social History Narrative   Not on file   Social Drivers of Health   Tobacco Use: Low Risk (04/23/2024)   Patient History    Smoking Tobacco Use: Never    Smokeless Tobacco Use: Never    Passive Exposure: Not on file  Financial Resource Strain: Not on file  Food Insecurity: Not on file  Transportation Needs: Not on file  Physical Activity: Not on file  Stress: Not on file  Social Connections: Not on file  Intimate Partner Violence: Not on file  Depression (PHQ2-9): Low Risk (04/08/2024)   Depression (PHQ2-9)    PHQ-2 Score:  0  Alcohol Screen: Not on file  Housing: Unknown (11/21/2023)   Received from [facility]   Epic    Unable to Pay for Housing in the Last Year: Not on file    Number of Times Moved in the Last Year: Not on file    At any time in the past 12 months, were you homeless or living in a shelter (including now)?: No  Utilities: Not on file  Health Literacy: Not on file    Vital Signs: Blood pressure 118/71, pulse 83, temperature 98 F (36.7 C), resp. rate 16, height 4' 7 (1.397 m), weight 106 lb (48.1 kg), SpO2 98%.  Examination: General Appearance: The patient is well-developed, well-nourished, and in no distress. Skin: Gross inspection of skin unremarkable. Head: normocephalic, no gross deformities. Eyes: no gross deformities noted. ENT: ears appear grossly normal no exudates. Neck: Supple. No thyromegaly. No LAD. Respiratory: no rhonchi noted. Cardiovascular: Normal S1 and S2 without murmur or rub. Extremities: No cyanosis. pulses are equal. Neurologic: Alert and oriented. No involuntary movements.  LABS: No results found for this or any previous visit (from the past 2160 hours).  Radiology: MM 3D SCREENING MAMMOGRAM BILATERAL BREAST Result Date: 11/15/2023 CLINICAL DATA:  Screening. EXAM: DIGITAL SCREENING BILATERAL MAMMOGRAM WITH TOMOSYNTHESIS AND CAD TECHNIQUE: Bilateral screening digital craniocaudal and mediolateral oblique mammograms were obtained. Bilateral screening digital breast tomosynthesis was performed. The images were evaluated with computer-aided detection. COMPARISON:  Previous exam(s). ACR Breast Density Category c: The breasts are heterogeneously dense, which may obscure small masses. FINDINGS: There are no findings suspicious for malignancy. IMPRESSION: No mammographic evidence of malignancy. A result letter of this screening mammogram will be mailed directly to the patient. RECOMMENDATION: Screening mammogram in one year. (Code:SM-B-01Y) BI-RADS  CATEGORY  1: Negative. Electronically Signed   By: Debby Satterfield M.D.   On: 11/15/2023 16:39    No results found.  No results found.  Assessment and Plan: Patient Active Problem List   Diagnosis Date Noted   Positive colorectal cancer screening using Cologuard test    Polyp of colon    Encounter for general adult medical examination with abnormal findings 09/25/2019   Encounter for screening mammogram for malignant neoplasm of breast 09/25/2019   Dysuria 10/01/2018   Acquired hypothyroidism 06/12/2018   Routine cervical smear 06/12/2018   Needs flu shot 02/04/2018   Essential hypertension 05/31/2017   Sleep apnea 05/31/2017   Spina bifida (HCC) 05/31/2017   Hemochromatosis 11/28/2014   Word finding difficulty 02/28/2013   Seizure disorder (HCC) 02/04/2013   Neurogenic bladder 02/04/2013   Left ankle sprain 09/29/2011   Left leg pain 09/29/2011    1. OSA (obstructive sleep apnea) (Primary) She still has significant apneas noted she will have the CPAP pressures adjusted I went ahead and wrote a prescription for that. - For home use only DME continuous  positive airway pressure (CPAP)  2. Weight loss She has had weight loss.  She is being followed by her primary for this  General Counseling: I have discussed the findings of the evaluation and examination with Briant.  I have also discussed any further diagnostic evaluation thatmay be needed or ordered today. Danai verbalizes understanding of the findings of todays visit. We also reviewed her medications today and discussed drug interactions and side effects including but not limited excessive drowsiness and altered mental states. We also discussed that there is always a risk not just to her but also people around her. she has been encouraged to call the office with any questions or concerns that should arise related to todays visit.  No orders of the defined types were placed in this encounter.    Time spent: 44  I have  personally obtained a history, examined the patient, evaluated laboratory and imaging results, formulated the assessment and plan and placed orders.    Elfreda DELENA Bathe, MD [facility] Pulmonary and Critical Care Sleep medicine

## 2024-04-23 NOTE — Telephone Encounter (Signed)
 Notified [facility] w/ [facility] of cpap order-Toni

## 2024-04-23 NOTE — Patient Instructions (Signed)

## 2024-04-30 ENCOUNTER — Inpatient Hospital Stay

## 2024-05-14 ENCOUNTER — Inpatient Hospital Stay: Attending: Oncology

## 2024-05-14 DIAGNOSIS — Z452 Encounter for adjustment and management of vascular access device: Secondary | ICD-10-CM | POA: Insufficient documentation

## 2024-06-11 ENCOUNTER — Inpatient Hospital Stay

## 2024-07-02 ENCOUNTER — Inpatient Hospital Stay: Attending: Oncology

## 2024-07-23 ENCOUNTER — Inpatient Hospital Stay

## 2024-07-24 ENCOUNTER — Other Ambulatory Visit

## 2024-07-25 ENCOUNTER — Ambulatory Visit: Admitting: Oncology

## 2024-07-30 ENCOUNTER — Ambulatory Visit: Admitting: Oncology

## 2024-07-30 ENCOUNTER — Other Ambulatory Visit

## 2024-08-20 ENCOUNTER — Inpatient Hospital Stay

## 2024-09-03 ENCOUNTER — Inpatient Hospital Stay

## 2024-10-10 ENCOUNTER — Encounter: Payer: PRIVATE HEALTH INSURANCE | Admitting: Physician Assistant

## 2024-10-14 ENCOUNTER — Ambulatory Visit: Payer: PRIVATE HEALTH INSURANCE | Admitting: Nurse Practitioner

## 2024-10-21 ENCOUNTER — Ambulatory Visit: Payer: PRIVATE HEALTH INSURANCE | Admitting: Internal Medicine
